# Patient Record
Sex: Male | Born: 1955 | Race: White | Hispanic: No | Marital: Single | Smoking: Former smoker
Health system: Southern US, Community
[De-identification: ages and names within clinical notes are randomized; demographics above are authoritative.]

## PROBLEM LIST (undated history)

## (undated) DIAGNOSIS — B20 Human immunodeficiency virus [HIV] disease: Secondary | ICD-10-CM

## (undated) DIAGNOSIS — Z21 Asymptomatic human immunodeficiency virus [HIV] infection status: Secondary | ICD-10-CM

## (undated) DIAGNOSIS — F419 Anxiety disorder, unspecified: Secondary | ICD-10-CM

## (undated) DIAGNOSIS — M75100 Unspecified rotator cuff tear or rupture of unspecified shoulder, not specified as traumatic: Secondary | ICD-10-CM

## (undated) DIAGNOSIS — M62838 Other muscle spasm: Secondary | ICD-10-CM

## (undated) DIAGNOSIS — R110 Nausea: Secondary | ICD-10-CM

## (undated) DIAGNOSIS — IMO0002 Reserved for concepts with insufficient information to code with codable children: Secondary | ICD-10-CM

## (undated) DIAGNOSIS — J449 Chronic obstructive pulmonary disease, unspecified: Secondary | ICD-10-CM

## (undated) DIAGNOSIS — G4700 Insomnia, unspecified: Secondary | ICD-10-CM

## (undated) HISTORY — DX: Anxiety disorder, unspecified: F41.9

## (undated) HISTORY — DX: Other muscle spasm: M62.838

## (undated) HISTORY — DX: Asymptomatic human immunodeficiency virus (hiv) infection status: Z21

## (undated) HISTORY — DX: Reserved for concepts with insufficient information to code with codable children: IMO0002

## (undated) HISTORY — DX: Nausea: R11.0

## (undated) HISTORY — PX: OTHER SURGICAL HISTORY: SHX169

## (undated) HISTORY — DX: Insomnia, unspecified: G47.00

## (undated) HISTORY — DX: Human immunodeficiency virus (HIV) disease: B20

## (undated) HISTORY — DX: Unspecified rotator cuff tear or rupture of unspecified shoulder, not specified as traumatic: M75.100

---

## 2005-09-28 ENCOUNTER — Ambulatory Visit: Payer: Self-pay | Admitting: Pain Medicine

## 2007-08-03 ENCOUNTER — Emergency Department: Payer: Self-pay | Admitting: Emergency Medicine

## 2008-02-01 ENCOUNTER — Emergency Department: Payer: Self-pay | Admitting: Emergency Medicine

## 2008-02-14 ENCOUNTER — Inpatient Hospital Stay: Payer: Self-pay | Admitting: Internal Medicine

## 2008-10-12 ENCOUNTER — Ambulatory Visit: Payer: Self-pay | Admitting: Otolaryngology

## 2009-08-26 ENCOUNTER — Ambulatory Visit: Payer: Self-pay | Admitting: Specialist

## 2009-09-14 ENCOUNTER — Emergency Department: Payer: Self-pay | Admitting: Emergency Medicine

## 2010-02-10 ENCOUNTER — Ambulatory Visit: Payer: Self-pay | Admitting: Internal Medicine

## 2010-02-10 ENCOUNTER — Encounter: Payer: Self-pay | Admitting: Infectious Diseases

## 2010-02-10 DIAGNOSIS — B20 Human immunodeficiency virus [HIV] disease: Secondary | ICD-10-CM | POA: Insufficient documentation

## 2010-02-10 LAB — CONVERTED CEMR LAB
ALT: 13 units/L (ref 0–53)
AST: 22 units/L (ref 0–37)
Alkaline Phosphatase: 42 units/L (ref 39–117)
CO2: 26 meq/L (ref 19–32)
Creatinine, Urine: 55.8 mg/dL
HIV 1 RNA Quant: 39 copies/mL
LDL Cholesterol: 118 mg/dL — ABNORMAL HIGH (ref 0–99)
Sodium: 138 meq/L (ref 135–145)
Total Bilirubin: 0.7 mg/dL (ref 0.3–1.2)
Total CHOL/HDL Ratio: 3.7
Total Protein, Urine: 1
Total Protein: 7.8 g/dL (ref 6.0–8.3)
VLDL: 33 mg/dL (ref 0–40)

## 2010-07-13 ENCOUNTER — Ambulatory Visit: Payer: Self-pay | Admitting: Infectious Diseases

## 2010-07-13 ENCOUNTER — Encounter: Payer: Self-pay | Admitting: Infectious Diseases

## 2010-07-13 LAB — CONVERTED CEMR LAB
AST: 28 units/L (ref 0–37)
Alkaline Phosphatase: 37 units/L — ABNORMAL LOW (ref 39–117)
BUN: 10 mg/dL (ref 6–23)
Creatinine, Ser: 0.8 mg/dL (ref 0.40–1.50)
Glucose, Bld: 126 mg/dL — ABNORMAL HIGH (ref 70–99)
HCV Ab: REACTIVE — AB
HDL: 58 mg/dL (ref >39)
Hep A Total Ab: NEGATIVE
Hepatitis B Surface Ag: NEGATIVE
LDL Cholesterol: 111 mg/dL — ABNORMAL HIGH (ref 0–99)
Total CHOL/HDL Ratio: 3.4
Triglycerides: 131 mg/dL (ref <150)

## 2010-10-06 ENCOUNTER — Ambulatory Visit: Payer: Self-pay | Admitting: Infectious Diseases

## 2010-10-06 LAB — CONVERTED CEMR LAB
ALT: 11 units/L (ref 0–53)
Albumin: 4.4 g/dL (ref 3.5–5.2)
Alkaline Phosphatase: 33 units/L — ABNORMAL LOW (ref 39–117)
Glucose, Bld: 104 mg/dL — ABNORMAL HIGH (ref 70–99)
LDL Cholesterol: 99 mg/dL (ref 0–99)
Potassium: 4.6 meq/L (ref 3.5–5.3)
Sodium: 140 meq/L (ref 135–145)
Total Bilirubin: 0.7 mg/dL (ref 0.3–1.2)
Total Protein: 6.7 g/dL (ref 6.0–8.3)
Triglycerides: 95 mg/dL (ref <150)
VLDL: 19 mg/dL (ref 0–40)

## 2011-01-24 NOTE — Assessment & Plan Note (Signed)
Summary: STUDY APPT/ LH   Vital Signs:  Patient profile:   55 year old male Height:      69 inches (175.26 cm) Weight:      165.75 pounds (75.34 kg) BMI:     24.57 Temp:     98.0 degrees F oral Pulse rate:   80 / minute BP sitting:   144 / 84  (left arm) Is Patient Diabetic? No Pain Assessment Patient in pain? yes     Location: lower back Intensity: 6 Onset of pain  chronic back pain Nutritional Status BMI of 19 -24 = normal  Does patient need assistance? Functional Status Self care Ambulation Normal   Patient here for week 672 ALLRT study visit. He denies any new problems. He is to see his regular doctor, Orlene Erm in September so I will send him his labs from this visit.Deirdre Evener RN  July 13, 2010 10:35 AM    Other Orders: Est. Patient Research Study [id number]) T-Comprehensive Metabolic Panel [id number]) T-Lipid Profile [phone]) T-Urine Protein [id number]) T-Urine Creatinine [phone] [id number]) T-Hepatitis B Surface Antigen [id number]) T-Hepatitis B Surface Antibody [id number]) T-Hepatitis B Core Antibody ([id number]) T-Hepatitis C Antibody ([id number]) T-Hepatitis A Antibody ([id number])  Process Orders Check Orders Results:     [facility]: ABN not required for this insurance Order queued for requisitioning for [facility]: July 13, 2010 9:38 AM  Tests Sent for requisitioning (July 13, 2010 9:38 AM):     07/13/2010: [facility] -- T-Comprehensive Metabolic Panel [[id number]] (signed)     07/13/2010: [facility] -- T-Lipid Profile [id number] (signed)     07/13/2010: [facility] -- T-Urine Protein [id number] (signed)     07/13/2010: [facility] -- T-Urine Creatinine [[id number]] (signed)     07/13/2010: [facility] -- T-Hepatitis B Surface Antigen [[id number]] (signed)     07/13/2010: [facility] -- T-Hepatitis B Surface Antibody  [[id number]] (signed)     07/13/2010: [facility] -- T-Hepatitis B Core Antibody [[id number]] (signed)     07/13/2010: [facility] -- T-Hepatitis C Antibody [[id number]] (signed)     07/13/2010: [facility] -- T-Hepatitis A Antibody [[id number]] (signed)

## 2011-01-24 NOTE — Miscellaneous (Signed)
Summary: HIV-1 RNA, CD4 (RESEARCH)  Clinical Lists Changes  Observations: Added new observation of CD4 COUNT: 483 microliters (02/10/2010 10:27) Added new observation of HIV1RNA QA: 39 copies/mL (02/10/2010 10:27)

## 2011-01-24 NOTE — Miscellaneous (Signed)
Summary: HIPAA Restrictions  HIPAA Restrictions   Imported By: Florinda Marker 07/13/2010 11:50:12  _____________________________________________________________________  External Attachment:    Type:   Image     Comment:   External Document

## 2011-01-24 NOTE — Assessment & Plan Note (Signed)
Summary: STUDY APPT/ LH    Other Orders: Est. Patient Research Study [id number]) T-Comprehensive Metabolic Panel [id number]) T-Lipid Profile [id number])

## 2011-01-24 NOTE — Assessment & Plan Note (Signed)
Summary: STUDY APPT/ LH   Vital Signs:  Patient profile:   55 year old male Weight:      162.6 pounds (73.91 kg) Temp:     96.7 degrees F oral Pulse rate:   79 / minute BP sitting:   131 / 84  (right arm) Is Patient Diabetic? No Research Study Name: ALLRT Pain Assessment Patient in pain? no      Nutritional Status BMI of 19 -24 = normal  Does patient need assistance? Functional Status Self care Ambulation Normal   Patient here for his first ALLRT visit at this site. He is transferring from [facility]. He denies any new complaints, but does have current joint, back pain issues and burning pain in his legs which is chronic. He will be seeing Dr. Servando Snare at [facility] in March, so we will fax his legs to them.Deirdre Evener RN  February 10, 2010 10:36 AM    Other Orders: T-Comprehensive Metabolic Panel [id number]) T-Lipid Profile [id number]) T-Urine Creatinine [id number]) T-Urine Protein [id number]) Process Orders Check Orders Results:     [facility]: ABN not required for this insurance Order queued for requisitioning for [facility]: February 10, 2010 9:26 AM  Tests Sent for requisitioning (February 10, 2010 9:26 AM):     02/10/2010: [facility] -- T-Comprehensive Metabolic Panel [[id number]] (signed)     02/10/2010: [facility] -- T-Lipid Profile [id number] (signed)     02/10/2010: [facility] -- T-Urine Creatinine [[id number]] (signed)     02/10/2010: [facility] -- T-Urine Protein [phone] (signed)

## 2011-02-22 ENCOUNTER — Encounter: Payer: Self-pay | Admitting: Infectious Diseases

## 2011-02-22 ENCOUNTER — Ambulatory Visit (INDEPENDENT_AMBULATORY_CARE_PROVIDER_SITE_OTHER): Payer: Self-pay

## 2011-02-22 DIAGNOSIS — B20 Human immunodeficiency virus [HIV] disease: Secondary | ICD-10-CM

## 2011-02-22 LAB — CONVERTED CEMR LAB
ALT: 15 units/L (ref 0–53)
AST: 29 units/L (ref 0–37)
Albumin: 4.6 g/dL (ref 3.5–5.2)
Basophils Absolute: 0 10*3/uL (ref 0.0–0.1)
CO2: 30 meq/L (ref 19–32)
Calcium: 9.4 mg/dL (ref 8.4–10.5)
Chloride: 102 meq/L (ref 96–112)
Cholesterol: 187 mg/dL (ref 0–200)
Eosinophils Relative: 1 % (ref 0–5)
HCT: 42.3 % (ref 39.0–52.0)
Lymphocytes Relative: 36 % (ref 12–46)
Lymphs Abs: 1.7 10*3/uL (ref 0.7–4.0)
Neutro Abs: 2.5 10*3/uL (ref 1.7–7.7)
Neutrophils Relative %: 51 % (ref 43–77)
Platelets: 259 10*3/uL (ref 150–400)
Potassium: 4.8 meq/L (ref 3.5–5.3)
RDW: 12.9 % (ref 11.5–15.5)
Sodium: 140 meq/L (ref 135–145)
Total CHOL/HDL Ratio: 3.6
Total Protein: 7.5 g/dL (ref 6.0–8.3)
Triglycerides: 189 mg/dL — ABNORMAL HIGH (ref <150)
WBC: 4.9 10*3/uL (ref 4.0–10.5)

## 2011-03-02 NOTE — Assessment & Plan Note (Signed)
Summary: STUDY APPT   Vital Signs:  Patient profile:   55 year old male Weight:      167.0 pounds (75.91 kg) Temp:     98.1 degrees F (36.72 degrees C) oral Pulse rate:   78 / minute BP sitting:   128 / 80  (right arm)  Vitals Entered By: Tacey Heap RN (February 22, 2011 11:38 AM)  02/22/2011 Research Visit: Pt here for A5001, week 704 study visit. Pt reports feeling well; VSS; Pt reports having 4 lower teeth [facility] ago [facility] [place]. Pt going for f/u on monday at [facility]. Pt denies any pain associated with this area; only pain for the first day. Pt does c/o ongoing back pain that is chronic. Non-fasting labs were drawn. Pt next research visit anticipated for June 07, 2011 @ 0930.  Tacey Heap RN  February 22, 2011 11:50 AM     Other Orders: Est. Patient Research Study [id number]) T-CBC w/Diff [id number]) T-Lipid Profile [id number]) T-Comprehensive Metabolic Panel [id number])   Orders Added: 1)  Est. Patient Research Study [04200] 2)  T-CBC w/Diff [[id number]] 3)  T-Lipid Profile [[id number]] 4)  T-Comprehensive Metabolic Panel [[id number]]

## 2011-05-02 ENCOUNTER — Encounter: Payer: Self-pay | Admitting: Emergency Medicine

## 2011-05-26 ENCOUNTER — Encounter: Payer: Self-pay | Admitting: Emergency Medicine

## 2011-06-14 ENCOUNTER — Ambulatory Visit (INDEPENDENT_AMBULATORY_CARE_PROVIDER_SITE_OTHER): Payer: Self-pay | Admitting: *Deleted

## 2011-06-14 VITALS — BP 118/80 | HR 67 | Temp 97.8°F | Wt 166.5 lb

## 2011-06-14 DIAGNOSIS — B20 Human immunodeficiency virus [HIV] disease: Secondary | ICD-10-CM

## 2011-06-14 LAB — COMPREHENSIVE METABOLIC PANEL
Albumin: 4.6 g/dL (ref 3.5–5.2)
Alkaline Phosphatase: 42 U/L (ref 39–117)
BUN: 8 mg/dL (ref 6–23)
Calcium: 9.4 mg/dL (ref 8.4–10.5)
Creat: 0.87 mg/dL (ref 0.50–1.35)
Glucose, Bld: 99 mg/dL (ref 70–99)
Potassium: 4.4 mEq/L (ref 3.5–5.3)

## 2011-06-14 LAB — LIPID PANEL
HDL: 63 mg/dL (ref >39)
Total CHOL/HDL Ratio: 3 Ratio
Triglycerides: 126 mg/dL (ref <150)

## 2011-06-14 NOTE — Progress Notes (Signed)
06/14/2011 @ 0900: Pt here for research study A5001, week 720. Pt denies any new findings. Pt has stopped taking Sustiva and Combivir in May 2012. He has started to take Atripla in May 2012. This was all discussed and decided with his doctor, Dr. Servando Snare, at [facility]. Fasting labs were drawn. Pt stated he will be getting new bottom dentures in August from [place]. Pt received $20 gift card for visit. Next scheduled appointment is for Wednesday, September 27, 2011 @ 10am. -- Tacey Heap RN

## 2011-06-29 ENCOUNTER — Encounter: Payer: Self-pay | Admitting: Infectious Diseases

## 2011-06-29 LAB — CD4/CD8 (T-HELPER/T-SUPPRESSOR CELL)
CD4%: 35.1
CD8: 608

## 2011-06-29 LAB — HIV-1 RNA QUANT-NO REFLEX-BLD: HIV-1 RNA Viral Load: <40

## 2011-09-27 ENCOUNTER — Encounter: Payer: Self-pay | Admitting: *Deleted

## 2011-09-27 ENCOUNTER — Ambulatory Visit (INDEPENDENT_AMBULATORY_CARE_PROVIDER_SITE_OTHER): Payer: Self-pay | Admitting: *Deleted

## 2011-09-27 VITALS — BP 142/85 | HR 81 | Temp 98.0°F | Wt 162.5 lb

## 2011-09-27 DIAGNOSIS — B20 Human immunodeficiency virus [HIV] disease: Secondary | ICD-10-CM

## 2011-09-27 LAB — COMPREHENSIVE METABOLIC PANEL
ALT: 8 U/L (ref 0–53)
Alkaline Phosphatase: 44 U/L (ref 39–117)
Creat: 0.86 mg/dL (ref 0.50–1.35)
Glucose, Bld: 105 mg/dL — ABNORMAL HIGH (ref 70–99)
Sodium: 139 mEq/L (ref 135–145)
Total Bilirubin: 0.5 mg/dL (ref 0.3–1.2)
Total Protein: 7 g/dL (ref 6.0–8.3)

## 2011-09-27 LAB — LIPID PANEL
LDL Cholesterol: 77 mg/dL (ref 0–99)
Total CHOL/HDL Ratio: 3.3 Ratio
Triglycerides: 145 mg/dL (ref <150)
VLDL: 29 mg/dL (ref 0–40)

## 2011-09-27 NOTE — Progress Notes (Signed)
09/27/2011 @ 1000: Pt here for research study A5001, week 736. Pt has knee pain for the past 2 weeks from working in the garden. Pt has no other complaints or new findings. Pt wanting to become an established patient here at [facility] since it is closer to where he lives. Give patient a transfer form to take to his PCP, Dr. Servando Snare, in [place]. VSS. Non fasting labs were drawn. Pt received $20.00 gift card for visit. Next scheduled appointment is for Wednesday, January 17, 2012 at 2pm. Tacey Heap RN

## 2011-10-12 ENCOUNTER — Encounter: Payer: Self-pay | Admitting: Infectious Disease

## 2011-10-12 ENCOUNTER — Ambulatory Visit (INDEPENDENT_AMBULATORY_CARE_PROVIDER_SITE_OTHER): Payer: Medicaid Other | Admitting: Infectious Disease

## 2011-10-12 VITALS — BP 150/78 | HR 78 | Temp 98.1°F | Wt 158.0 lb

## 2011-10-12 DIAGNOSIS — M62838 Other muscle spasm: Secondary | ICD-10-CM

## 2011-10-12 DIAGNOSIS — F411 Generalized anxiety disorder: Secondary | ICD-10-CM

## 2011-10-12 DIAGNOSIS — J302 Other seasonal allergic rhinitis: Secondary | ICD-10-CM

## 2011-10-12 DIAGNOSIS — Z23 Encounter for immunization: Secondary | ICD-10-CM

## 2011-10-12 DIAGNOSIS — B20 Human immunodeficiency virus [HIV] disease: Secondary | ICD-10-CM

## 2011-10-12 DIAGNOSIS — IMO0002 Reserved for concepts with insufficient information to code with codable children: Secondary | ICD-10-CM | POA: Insufficient documentation

## 2011-10-12 DIAGNOSIS — J309 Allergic rhinitis, unspecified: Secondary | ICD-10-CM

## 2011-10-12 DIAGNOSIS — Z8739 Personal history of other diseases of the musculoskeletal system and connective tissue: Secondary | ICD-10-CM | POA: Insufficient documentation

## 2011-10-12 DIAGNOSIS — M75100 Unspecified rotator cuff tear or rupture of unspecified shoulder, not specified as traumatic: Secondary | ICD-10-CM | POA: Insufficient documentation

## 2011-10-12 DIAGNOSIS — R110 Nausea: Secondary | ICD-10-CM | POA: Insufficient documentation

## 2011-10-12 DIAGNOSIS — F419 Anxiety disorder, unspecified: Secondary | ICD-10-CM | POA: Insufficient documentation

## 2011-10-12 DIAGNOSIS — G4700 Insomnia, unspecified: Secondary | ICD-10-CM | POA: Insufficient documentation

## 2011-10-12 MED ORDER — BACLOFEN 10 MG PO TABS: 10.0000 mg | ORAL_TABLET | Freq: Every day | ORAL | Status: DC

## 2011-10-12 MED ORDER — FLUTICASONE PROPIONATE (INHAL) 50 MCG/BLIST IN AEPB: 1.0000 | INHALATION_SPRAY | Freq: Two times a day (BID) | RESPIRATORY_TRACT | Status: DC

## 2011-10-12 MED ORDER — FLUTICASONE PROPIONATE 50 MCG/ACT NA SUSP: 2.0000 | Freq: Every day | NASAL | Status: DC

## 2011-10-12 MED ORDER — OXYCODONE-ACETAMINOPHEN 10-325 MG PO TABS: 1.0000 | ORAL_TABLET | ORAL | Status: DC | PRN

## 2011-10-12 NOTE — Progress Notes (Signed)
Subjective:    Patient ID: Albert Wong, male    DOB: 1956/09/03, 55 y.o.   MRN: [id number]  HPI  Mr. Nolon Bussing is a 55 year old Caucasian male with HIV that has been followed at [facility] by Dr. Orlene Erm. He was diagnosed in 56 years ago. At that time his CD4 count was about 300 viral load bilaterally thousand. He's been on antiviral therapy since then. More recently he has been on ATRIPLA with perfect neurological suppression with an undetectable viral load for many years. His most recent labs here with [facility] show his viral load to be less than 40 and his CD4 count is 45 5. He does have comorbid problems of degenerative disc disease muscle spasms for which he takes baclofen as well as Percocet. He has insomnia and is treated for this with diazepam. He also takes Marinol to help with chronic nausea. We spent in total of 60 minutes with this patient today including greater than 50% of time counseling the patient and coordinating his care obtaining his records and you to [facility].  Review of Systems  Constitutional: Negative for fever, chills, diaphoresis, activity change, appetite change, fatigue and unexpected weight change.  HENT: Negative for congestion, sore throat, rhinorrhea, sneezing, trouble swallowing and sinus pressure.   Eyes: Negative for photophobia and visual disturbance.  Respiratory: Negative for cough, chest tightness, shortness of breath, wheezing and stridor.   Cardiovascular: Negative for chest pain, palpitations and leg swelling.  Gastrointestinal: Negative for nausea, vomiting, abdominal pain, diarrhea, constipation, blood in stool, abdominal distention and anal bleeding.  Genitourinary: Negative for dysuria, hematuria, flank pain and difficulty urinating.  Musculoskeletal: Negative for myalgias, back pain, joint swelling, arthralgias and gait problem.  Skin: Negative for color change, pallor, rash and wound.  Neurological: Negative for dizziness, tremors, weakness and  light-headedness.  Hematological: Negative for adenopathy. Does not bruise/bleed easily.  Psychiatric/Behavioral: Negative for behavioral problems, confusion, sleep disturbance, dysphoric mood, decreased concentration and agitation.       Objective:   Physical Exam  Constitutional: He is oriented to person, place, and time. He appears well-developed and well-nourished. No distress.  HENT:  Head: Normocephalic and atraumatic.  Mouth/Throat: Oropharynx is clear and moist. No oropharyngeal exudate.  Eyes: Conjunctivae and EOM are normal. Pupils are equal, round, and reactive to light. No scleral icterus.  Neck: Normal range of motion. Neck supple. No JVD present.  Cardiovascular: Normal rate, regular rhythm and normal heart sounds.  Exam reveals no gallop and no friction rub.   No murmur heard. Pulmonary/Chest: Effort normal and breath sounds normal. No respiratory distress. He has no wheezes. He has no rales. He exhibits no tenderness.  Abdominal: He exhibits no distension and no mass. There is no tenderness. There is no rebound and no guarding.  Musculoskeletal: He exhibits no edema and no tenderness.  Lymphadenopathy:    He has no cervical adenopathy.  Neurological: He is alert and oriented to person, place, and time. He has normal reflexes. He exhibits normal muscle tone. Coordination normal.  Skin: Skin is warm and dry. He is not diaphoretic. No erythema. No pallor.       Tattoos bilaterally  Psychiatric: He has a normal mood and affect. His behavior is normal. Judgment and thought content normal.          Assessment & Plan:  HIV INFECTION Continue Atripla continued labs through the [facility].  Degenerative disk disease Continue Percocet for pain draw up pain contract  Muscle spasm Continue  baclofen  Anxiety Continue Valium  Insomnia Valium  Chronic nausea Continue Marinol

## 2011-10-12 NOTE — Assessment & Plan Note (Signed)
-   Continue Valium 

## 2011-10-12 NOTE — Assessment & Plan Note (Signed)
Continue Atripla continued labs through the [facility].

## 2011-10-12 NOTE — Assessment & Plan Note (Signed)
Continue Percocet for pain draw up pain contract

## 2011-10-12 NOTE — Assessment & Plan Note (Signed)
Valium

## 2011-10-12 NOTE — Assessment & Plan Note (Signed)
Continue baclofen 

## 2011-10-12 NOTE — Assessment & Plan Note (Signed)
Continue Marinol

## 2011-10-19 ENCOUNTER — Encounter: Payer: Self-pay | Admitting: Infectious Disease

## 2011-10-26 ENCOUNTER — Telehealth: Payer: Self-pay | Admitting: *Deleted

## 2011-10-26 NOTE — Telephone Encounter (Signed)
Patient called advised that he was in to see the provider on 10/12/11 and mentioned he wants to stop smoking. However he forgot to ask for a Rx for the Nicotine patches. He called today to ask if his provider would prescribe them now. Advised the patient that since we have never prescribed them for him I would have to let the doctor know what the patient wants and have him first decide if he will give it to him ant then the dosing information. He agreed and understands that this may take a minute as his provider is not in the clinic today.

## 2011-10-30 ENCOUNTER — Other Ambulatory Visit: Payer: Self-pay | Admitting: Infectious Disease

## 2011-10-30 DIAGNOSIS — F17200 Nicotine dependence, unspecified, uncomplicated: Secondary | ICD-10-CM

## 2011-10-30 MED ORDER — NICOTINE 21 MG/24HR TD PT24: 1.0000 | MEDICATED_PATCH | TRANSDERMAL | Status: DC

## 2011-10-30 MED ORDER — NICOTINE 7 MG/24HR TD PT24: 1.0000 | MEDICATED_PATCH | TRANSDERMAL | Status: DC

## 2011-10-30 MED ORDER — NICOTINE 14 MG/24HR TD PT24: 1.0000 | MEDICATED_PATCH | TRANSDERMAL | Status: DC

## 2011-10-30 NOTE — Telephone Encounter (Signed)
That is fine 

## 2011-10-31 ENCOUNTER — Other Ambulatory Visit: Payer: Self-pay | Admitting: Licensed Clinical Social Worker

## 2011-10-31 DIAGNOSIS — F17200 Nicotine dependence, unspecified, uncomplicated: Secondary | ICD-10-CM

## 2011-10-31 MED ORDER — NICOTINE 14 MG/24HR TD PT24: 1.0000 | MEDICATED_PATCH | TRANSDERMAL | Status: AC

## 2011-10-31 MED ORDER — NICOTINE 7 MG/24HR TD PT24: 1.0000 | MEDICATED_PATCH | TRANSDERMAL | Status: AC

## 2011-10-31 MED ORDER — NICOTINE 21 MG/24HR TD PT24: 1.0000 | MEDICATED_PATCH | TRANSDERMAL | Status: AC

## 2011-11-03 ENCOUNTER — Other Ambulatory Visit: Payer: Self-pay | Admitting: *Deleted

## 2011-11-03 DIAGNOSIS — H1010 Acute atopic conjunctivitis, unspecified eye: Secondary | ICD-10-CM

## 2011-11-03 MED ORDER — OLOPATADINE HCL 0.1 % OP SOLN: 1.0000 [drp] | Freq: Two times a day (BID) | OPHTHALMIC | Status: DC

## 2011-11-06 ENCOUNTER — Other Ambulatory Visit: Payer: Self-pay | Admitting: Licensed Clinical Social Worker

## 2011-11-06 DIAGNOSIS — IMO0002 Reserved for concepts with insufficient information to code with codable children: Secondary | ICD-10-CM

## 2011-11-06 MED ORDER — OXYCODONE-ACETAMINOPHEN 10-325 MG PO TABS: 1.0000 | ORAL_TABLET | ORAL | Status: DC | PRN

## 2011-11-21 ENCOUNTER — Other Ambulatory Visit: Payer: Self-pay | Admitting: Infectious Disease

## 2011-11-21 DIAGNOSIS — B20 Human immunodeficiency virus [HIV] disease: Secondary | ICD-10-CM

## 2011-11-30 ENCOUNTER — Telehealth: Payer: Self-pay | Admitting: *Deleted

## 2011-11-30 DIAGNOSIS — IMO0002 Reserved for concepts with insufficient information to code with codable children: Secondary | ICD-10-CM

## 2011-11-30 NOTE — Telephone Encounter (Signed)
I rec'd a prior auth form for pt's pantanol eye drops. I called the pharmacy as I see it is on the preferred list. They told me it has been changed to non-preferred. I called medicaid & they just changed the formulary. Pt must try & fail 2 other eye drops for a trial of 30 days each before they will approve pantanol. Sent to md for change of med if desired

## 2011-12-04 ENCOUNTER — Other Ambulatory Visit: Payer: Self-pay | Admitting: Licensed Clinical Social Worker

## 2011-12-04 DIAGNOSIS — M62838 Other muscle spasm: Secondary | ICD-10-CM

## 2011-12-04 MED ORDER — BACLOFEN 10 MG PO TABS: 10.0000 mg | ORAL_TABLET | Freq: Every day | ORAL | Status: DC

## 2011-12-04 MED ORDER — OXYCODONE-ACETAMINOPHEN 10-325 MG PO TABS: 1.0000 | ORAL_TABLET | ORAL | Status: DC | PRN

## 2011-12-04 NOTE — Telephone Encounter (Signed)
Explained to him we cannot get the pantonol eye drops due to medicaid. I will send another message to md to see if there is an alternative Pain meds due 23rd. Told him I will have them ready by Friday the 21st

## 2011-12-04 NOTE — Telephone Encounter (Signed)
[employer] lists the following as their Preferred  Topical antihistamines for allergic conjunctivitis:  olopatadine (Patanol, Pataday)  azelastine HCl (Optivar)  epinastine (Elestat)  pemirolast potassium (Alamast)  ketotifen fumarate (generic, Ketotifen))   Are any of these on the preferred list for his insurance?

## 2011-12-05 ENCOUNTER — Other Ambulatory Visit: Payer: Self-pay | Admitting: *Deleted

## 2011-12-05 DIAGNOSIS — R110 Nausea: Secondary | ICD-10-CM

## 2011-12-05 MED ORDER — DRONABINOL 5 MG PO CAPS: 5.0000 mg | ORAL_CAPSULE | Freq: Two times a day (BID) | ORAL | Status: DC

## 2011-12-05 NOTE — Telephone Encounter (Signed)
alamast is on the preferred list.  ( Pantanol is as well but even though they tried to make it go thru, medicaid still denied it )

## 2011-12-11 NOTE — Telephone Encounter (Signed)
alamast is on their preferred list

## 2011-12-12 ENCOUNTER — Other Ambulatory Visit: Payer: Self-pay | Admitting: Infectious Disease

## 2011-12-12 ENCOUNTER — Other Ambulatory Visit: Payer: Self-pay | Admitting: *Deleted

## 2011-12-12 DIAGNOSIS — H1010 Acute atopic conjunctivitis, unspecified eye: Secondary | ICD-10-CM

## 2011-12-12 MED ORDER — PEMIROLAST POTASSIUM 0.1 % OP SOLN: 2.0000 [drp] | Freq: Four times a day (QID) | OPHTHALMIC | Status: DC

## 2011-12-12 NOTE — Telephone Encounter (Signed)
I added alamast to his active meds

## 2011-12-12 NOTE — Telephone Encounter (Signed)
He wants to come pick up his pain med rx. States medicaid will not fill it until Sunday. Told him this would be fine. Also he c/o R knee pain. States this is the second time in 2 weeks that he has injured it. He declined to see another md. Told him md would likely send him to a specialist. He has medicaid which will pay for this. He refused to make an appt here now. States he wants to wait for after the holidays

## 2011-12-13 ENCOUNTER — Telehealth: Payer: Self-pay | Admitting: *Deleted

## 2011-12-13 NOTE — Telephone Encounter (Signed)
Notification from [facility] that Alamast eye drops are on the "preferred" list for Medicaid.  Phone call to Medicaid.  Due to pt not being tried on alternative "non-preferred" drops he must start with another generic drop ie., pataday, cromolyn sodium drops, or Alamast.  Will forward to Dr. Algis Liming for advise.

## 2012-01-08 ENCOUNTER — Other Ambulatory Visit: Payer: Self-pay | Admitting: *Deleted

## 2012-01-08 DIAGNOSIS — IMO0002 Reserved for concepts with insufficient information to code with codable children: Secondary | ICD-10-CM

## 2012-01-08 MED ORDER — OXYCODONE-ACETAMINOPHEN 10-325 MG PO TABS: 1.0000 | ORAL_TABLET | ORAL | Status: DC | PRN

## 2012-01-08 NOTE — Telephone Encounter (Signed)
Requesting pain rx refill to be picked up 01/17/12 when he comes for Research OV.

## 2012-01-10 ENCOUNTER — Other Ambulatory Visit: Payer: Self-pay | Admitting: Infectious Disease

## 2012-01-10 DIAGNOSIS — H1010 Acute atopic conjunctivitis, unspecified eye: Secondary | ICD-10-CM

## 2012-01-10 MED ORDER — PEMIROLAST POTASSIUM 0.1 % OP SOLN: 2.0000 [drp] | Freq: Four times a day (QID) | OPHTHALMIC | Status: DC

## 2012-01-10 NOTE — Progress Notes (Signed)
There were issues with his insurance.  There were multiple faxes sent which were confusing.  The last ones were in your box earlier this week.

## 2012-01-10 NOTE — Telephone Encounter (Signed)
Lets go with alamast

## 2012-01-16 ENCOUNTER — Encounter: Payer: Self-pay | Admitting: Infectious Disease

## 2012-01-16 ENCOUNTER — Other Ambulatory Visit: Payer: Self-pay | Admitting: Infectious Disease

## 2012-01-16 DIAGNOSIS — H1010 Acute atopic conjunctivitis, unspecified eye: Secondary | ICD-10-CM

## 2012-01-16 MED ORDER — OLOPATADINE HCL 0.2 % OP SOLN: 1.0000 [drp] | Freq: Two times a day (BID) | OPHTHALMIC | Status: DC

## 2012-01-17 ENCOUNTER — Ambulatory Visit (INDEPENDENT_AMBULATORY_CARE_PROVIDER_SITE_OTHER): Payer: Medicaid Other | Admitting: *Deleted

## 2012-01-17 ENCOUNTER — Ambulatory Visit (INDEPENDENT_AMBULATORY_CARE_PROVIDER_SITE_OTHER): Payer: Medicaid Other | Admitting: Infectious Disease

## 2012-01-17 ENCOUNTER — Encounter: Payer: Self-pay | Admitting: *Deleted

## 2012-01-17 ENCOUNTER — Encounter: Payer: Self-pay | Admitting: Infectious Disease

## 2012-01-17 VITALS — BP 158/83 | HR 80 | Temp 97.6°F | Resp 16 | Wt 158.2 lb

## 2012-01-17 DIAGNOSIS — Z79899 Other long term (current) drug therapy: Secondary | ICD-10-CM

## 2012-01-17 DIAGNOSIS — B20 Human immunodeficiency virus [HIV] disease: Secondary | ICD-10-CM

## 2012-01-17 DIAGNOSIS — W19XXXA Unspecified fall, initial encounter: Secondary | ICD-10-CM

## 2012-01-17 DIAGNOSIS — Z23 Encounter for immunization: Secondary | ICD-10-CM

## 2012-01-17 DIAGNOSIS — M545 Low back pain, unspecified: Secondary | ICD-10-CM

## 2012-01-17 DIAGNOSIS — B1920 Unspecified viral hepatitis C without hepatic coma: Secondary | ICD-10-CM | POA: Insufficient documentation

## 2012-01-17 LAB — CBC WITH DIFFERENTIAL/PLATELET
Basophils Relative: 1 % (ref 0–1)
Eosinophils Absolute: 0 10*3/uL (ref 0.0–0.7)
HCT: 47.7 % (ref 39.0–52.0)
Hemoglobin: 16.3 g/dL (ref 13.0–17.0)
MCH: 33.3 pg (ref 26.0–34.0)
MCHC: 34.2 g/dL (ref 30.0–36.0)
Monocytes Absolute: 0.7 10*3/uL (ref 0.1–1.0)
Monocytes Relative: 8 % (ref 3–12)

## 2012-01-17 LAB — COMPREHENSIVE METABOLIC PANEL
ALT: 11 U/L (ref 0–53)
AST: 27 U/L (ref 0–37)
Alkaline Phosphatase: 48 U/L (ref 39–117)
Calcium: 9.6 mg/dL (ref 8.4–10.5)
Chloride: 102 mEq/L (ref 96–112)
Creat: 0.88 mg/dL (ref 0.50–1.35)
Potassium: 4.4 mEq/L (ref 3.5–5.3)

## 2012-01-17 LAB — LIPID PANEL
LDL Cholesterol: 107 mg/dL — ABNORMAL HIGH (ref 0–99)
Triglycerides: 166 mg/dL — ABNORMAL HIGH (ref <150)
VLDL: 33 mg/dL (ref 0–40)

## 2012-01-17 MED ORDER — MORPHINE SULFATE CR 30 MG PO TB12: 30.0000 mg | ORAL_TABLET | Freq: Two times a day (BID) | ORAL | Status: DC

## 2012-01-17 MED ORDER — OXYCODONE HCL 10 MG PO TB12: 10.0000 mg | ORAL_TABLET | Freq: Three times a day (TID) | ORAL | Status: DC | PRN

## 2012-01-17 NOTE — Assessment & Plan Note (Addendum)
Never been treating. Per pt, they had been waiting for IFN free regimen. ? Clinical trial in [place], we dont have one ourselves for hep c rx yet

## 2012-01-17 NOTE — Assessment & Plan Note (Signed)
Will go to ms contin plus oxycodone

## 2012-01-17 NOTE — Assessment & Plan Note (Signed)
Perfect suppression 

## 2012-01-17 NOTE — Assessment & Plan Note (Addendum)
Apparently due to loss of strength in right leg suddenly, this has happened multiple times in the past. Will check MRI to ensure no progression of findings, will check plain films of knee

## 2012-01-17 NOTE — Progress Notes (Signed)
Subjective:    Patient ID: Albert Wong, male    DOB: 14-Oct-1956, 56 y.o.   MRN: [id number]  HPI  56 year old with HIV and Hepatitis C coinfection, has been followed at [facility] by Dr. Orlene Erm. He was diagnosed in 25 years ago. He has been perfectly suppressed on atripla. He has chronic pain in lower back and apparently has lumbar disk disease. He is worked into clinic today for acute problem of having fallen twice int he past two weeks. One occurred in the woods while he was walking when his right leg suddenly gave out from under him and he fell without injury that time. Last week walking down his driveway his right leg again gave out from under him and he fell striking his left hand and his left knee against the ground. His pain at these sites and more so his lower back is not controlled with his four 10mg  oxycodones per day.  ON exam there are no visible or palpable fractures or deformities and he has normal strength on exam.. I spent greater than 45 minutes with the patient including greater than 50% of time in face to face counsel of the patient and in coordination of their care.    Review of Systems  Constitutional: Negative for fever, chills, diaphoresis, activity change, appetite change, fatigue and unexpected weight change.  HENT: Negative for congestion, sore throat, rhinorrhea, sneezing, trouble swallowing and sinus pressure.   Eyes: Negative for photophobia and visual disturbance.  Respiratory: Negative for cough, chest tightness, shortness of breath, wheezing and stridor.   Cardiovascular: Negative for chest pain, palpitations and leg swelling.  Gastrointestinal: Negative for nausea, vomiting, abdominal pain, diarrhea, constipation, blood in stool, abdominal distention and anal bleeding.  Genitourinary: Negative for dysuria, hematuria, flank pain and difficulty urinating.  Musculoskeletal: Positive for myalgias, back pain and arthralgias. Negative for joint swelling and gait  problem.  Skin: Negative for color change, pallor, rash and wound.  Neurological: Positive for weakness. Negative for dizziness, tremors and light-headedness.  Hematological: Negative for adenopathy. Does not bruise/bleed easily.  Psychiatric/Behavioral: Negative for behavioral problems, confusion, sleep disturbance, dysphoric mood, decreased concentration and agitation.       Objective:   Physical Exam  Constitutional: He is oriented to person, place, and time. He appears well-developed and well-nourished. No distress.  HENT:  Head: Normocephalic and atraumatic.  Mouth/Throat: Oropharynx is clear and moist. No oropharyngeal exudate.  Eyes: Conjunctivae and EOM are normal. Pupils are equal, round, and reactive to light. No scleral icterus.  Neck: Normal range of motion. Neck supple. No JVD present.  Cardiovascular: Normal rate, regular rhythm and normal heart sounds.  Exam reveals no gallop and no friction rub.   No murmur heard. Pulmonary/Chest: Effort normal and breath sounds normal. No respiratory distress. He has no wheezes. He has no rales. He exhibits no tenderness.  Abdominal: He exhibits no distension and no mass. There is no tenderness. There is no rebound and no guarding.  Musculoskeletal: He exhibits no edema and no tenderness.       Right knee: He exhibits normal range of motion, no swelling, no effusion, no ecchymosis, no deformity and no laceration. tenderness found.       Left knee: He exhibits bony tenderness. He exhibits normal range of motion, no swelling, no effusion, no ecchymosis, no deformity, no laceration, no erythema, normal alignment and no LCL laxity.       Back:  Lymphadenopathy:    He has no  cervical adenopathy.  Neurological: He is alert and oriented to person, place, and time. He has normal reflexes. He exhibits normal muscle tone. Coordination normal.  Skin: Skin is warm and dry. He is not diaphoretic. No erythema. No pallor.  Psychiatric: He has a normal  mood and affect. His behavior is normal. Judgment and thought content normal.          Assessment & Plan:  HIV INFECTION Perfect suppression  Hepatitis C Never been treating. Per pt, they had been waiting for IFN free regimen. ? Clinical trial in [place], we dont have one ourselves for hep c rx yet  Fall Apparently due to loss of strength in right leg suddenly, this has happened multiple times in the past. Will check MRI to ensure no progression of findings, will check plain films of knee  Lumbago Will go to ms contin plus oxycodone

## 2012-01-17 NOTE — Progress Notes (Signed)
Patient here for ALLRT study appt., week 752. He is in considerable pain with his back. He says he has fallen several times lately when his rt leg gives out. The pain med he is on does not seem to help. He is also having trouble keeping his weight up. He saw Dr. Daiva Eves today for this problem. He will return in June for the next study appt.

## 2012-01-17 NOTE — Progress Notes (Signed)
Addended by: Starleen Arms D on: 01/17/2012 11:30 AM   Modules accepted: Orders

## 2012-01-18 ENCOUNTER — Other Ambulatory Visit: Payer: Self-pay | Admitting: Licensed Clinical Social Worker

## 2012-01-18 DIAGNOSIS — G8929 Other chronic pain: Secondary | ICD-10-CM

## 2012-01-18 DIAGNOSIS — H1010 Acute atopic conjunctivitis, unspecified eye: Secondary | ICD-10-CM

## 2012-01-18 MED ORDER — OLOPATADINE HCL 0.2 % OP SOLN: 1.0000 [drp] | Freq: Two times a day (BID) | OPHTHALMIC | Status: DC

## 2012-01-18 MED ORDER — MORPHINE SULFATE 15 MG PO TABS: 15.0000 mg | ORAL_TABLET | Freq: Three times a day (TID) | ORAL | Status: AC | PRN

## 2012-01-18 MED ORDER — MORPHINE SULFATE 15 MG PO TABS: 15.0000 mg | ORAL_TABLET | Freq: Three times a day (TID) | ORAL | Status: DC | PRN

## 2012-01-18 NOTE — Telephone Encounter (Signed)
Printed first rx with incorrect quantity. Rx needed to be 90 instead of 30 per Dr. Daiva Eves

## 2012-01-19 ENCOUNTER — Ambulatory Visit: Payer: Medicaid Other | Admitting: Rehabilitative and Restorative Service Providers"

## 2012-01-25 ENCOUNTER — Telehealth: Payer: Self-pay | Admitting: *Deleted

## 2012-01-25 NOTE — Telephone Encounter (Signed)
Can we appeal? 

## 2012-01-25 NOTE — Telephone Encounter (Signed)
Probably, ask whoever sent the request to resend. Please write something to attach that will sway them

## 2012-01-25 NOTE — Telephone Encounter (Signed)
He states he got a letter from [employer] that they would not approve the imaging studies md ordered. He wanted to know what to do next. I told him I will send this to the md & his assistant

## 2012-01-29 ENCOUNTER — Ambulatory Visit: Payer: Medicaid Other | Attending: Infectious Disease | Admitting: Rehabilitative and Restorative Service Providers"

## 2012-01-29 DIAGNOSIS — M545 Low back pain, unspecified: Secondary | ICD-10-CM | POA: Insufficient documentation

## 2012-01-29 DIAGNOSIS — M2569 Stiffness of other specified joint, not elsewhere classified: Secondary | ICD-10-CM | POA: Insufficient documentation

## 2012-01-29 DIAGNOSIS — IMO0001 Reserved for inherently not codable concepts without codable children: Secondary | ICD-10-CM | POA: Insufficient documentation

## 2012-01-31 ENCOUNTER — Other Ambulatory Visit: Payer: Self-pay | Admitting: Infectious Disease

## 2012-01-31 NOTE — Telephone Encounter (Signed)
Tamika do you have an idea about who we would need to talk to at his insurance co to get this approved? It is NOT urgent

## 2012-01-31 NOTE — Telephone Encounter (Signed)
Thanks Tamika. 

## 2012-01-31 NOTE — Telephone Encounter (Signed)
I am not sure but I will call the insurance and find out.

## 2012-02-07 ENCOUNTER — Ambulatory Visit: Payer: Medicaid Other | Admitting: Rehabilitative and Restorative Service Providers"

## 2012-02-09 ENCOUNTER — Other Ambulatory Visit: Payer: Self-pay | Admitting: *Deleted

## 2012-02-09 NOTE — Telephone Encounter (Signed)
Patient called requesting his MsContin refills for Wednesday, when he will be getting physical therapy.  Patient takes both 15 mg and 30 mg, for some reason the 15 mg was removed from his med list.  I added it back, he thought we would be closed on Monday, he will call then so the scripts can be printed for Dr. Daiva Eves to sign. Wendall Mola CMA

## 2012-02-12 ENCOUNTER — Other Ambulatory Visit: Payer: Self-pay | Admitting: *Deleted

## 2012-02-12 DIAGNOSIS — M545 Low back pain, unspecified: Secondary | ICD-10-CM

## 2012-02-12 MED ORDER — MORPHINE SULFATE CR 30 MG PO TB12: 30.0000 mg | ORAL_TABLET | Freq: Two times a day (BID) | ORAL | Status: DC

## 2012-02-12 MED ORDER — MORPHINE SULFATE 15 MG PO TABS: 15.0000 mg | ORAL_TABLET | Freq: Three times a day (TID) | ORAL | Status: DC | PRN

## 2012-02-14 ENCOUNTER — Other Ambulatory Visit: Payer: Self-pay | Admitting: *Deleted

## 2012-02-14 ENCOUNTER — Ambulatory Visit: Payer: Medicaid Other | Admitting: Rehabilitative and Restorative Service Providers"

## 2012-02-14 DIAGNOSIS — M545 Low back pain, unspecified: Secondary | ICD-10-CM

## 2012-02-14 MED ORDER — MORPHINE SULFATE 15 MG PO TABS: 15.0000 mg | ORAL_TABLET | Freq: Three times a day (TID) | ORAL | Status: DC | PRN

## 2012-02-14 NOTE — Telephone Encounter (Signed)
He cam e in to get his RXs. I reprinted the morphine 15mg . I had printed #30 & he gets #90. I confirmed this with his pharmacy. The old rx torn & witnessed by Traci Sermon, NP

## 2012-02-21 ENCOUNTER — Encounter: Payer: Medicaid Other | Admitting: Rehabilitative and Restorative Service Providers"

## 2012-02-28 ENCOUNTER — Encounter: Payer: Medicaid Other | Admitting: Rehabilitative and Restorative Service Providers"

## 2012-03-07 ENCOUNTER — Encounter: Payer: Medicaid Other | Admitting: Rehabilitative and Restorative Service Providers"

## 2012-03-11 ENCOUNTER — Other Ambulatory Visit: Payer: Self-pay | Admitting: Licensed Clinical Social Worker

## 2012-03-11 ENCOUNTER — Telehealth: Payer: Self-pay | Admitting: *Deleted

## 2012-03-11 DIAGNOSIS — B20 Human immunodeficiency virus [HIV] disease: Secondary | ICD-10-CM

## 2012-03-11 DIAGNOSIS — M545 Low back pain, unspecified: Secondary | ICD-10-CM

## 2012-03-11 MED ORDER — MORPHINE SULFATE 15 MG PO TABS: 15.0000 mg | ORAL_TABLET | Freq: Three times a day (TID) | ORAL | Status: DC | PRN

## 2012-03-11 MED ORDER — MORPHINE SULFATE CR 30 MG PO TB12: 30.0000 mg | ORAL_TABLET | Freq: Two times a day (BID) | ORAL | Status: DC

## 2012-03-11 NOTE — Telephone Encounter (Signed)
Patient called for narcotic medication refill which is due on 03/16/12.  Rx given to MD for signature.

## 2012-04-02 ENCOUNTER — Telehealth: Payer: Self-pay | Admitting: *Deleted

## 2012-04-02 NOTE — Telephone Encounter (Signed)
States he is lossing weight. Drinks a can of Boost a day & wants to know if that was ok. I told him yes. States it is $12 for 6 cans. He has an appt here soon. To talk with md. Maybe he can get Ensure thru [facility]. Advised eating regular meals with high calories. Avoid sweets. States he is under a lot of stress taking care of his mother

## 2012-04-03 ENCOUNTER — Other Ambulatory Visit: Payer: Medicaid Other

## 2012-04-03 DIAGNOSIS — B20 Human immunodeficiency virus [HIV] disease: Secondary | ICD-10-CM

## 2012-04-03 LAB — COMPREHENSIVE METABOLIC PANEL
Albumin: 4.5 g/dL (ref 3.5–5.2)
BUN: 10 mg/dL (ref 6–23)
Calcium: 9.9 mg/dL (ref 8.4–10.5)
Chloride: 102 mEq/L (ref 96–112)
Glucose, Bld: 135 mg/dL — ABNORMAL HIGH (ref 70–99)
Potassium: 4.6 mEq/L (ref 3.5–5.3)

## 2012-04-03 LAB — CBC WITH DIFFERENTIAL/PLATELET
Basophils Relative: 0 % (ref 0–1)
Eosinophils Relative: 1 % (ref 0–5)
HCT: 47.9 % (ref 39.0–52.0)
Hemoglobin: 16 g/dL (ref 13.0–17.0)
Lymphocytes Relative: 25 % (ref 12–46)
MCHC: 33.4 g/dL (ref 30.0–36.0)
MCV: 99.2 fL (ref 78.0–100.0)
Monocytes Absolute: 0.4 10*3/uL (ref 0.1–1.0)
Monocytes Relative: 8 % (ref 3–12)
Neutro Abs: 3.4 10*3/uL (ref 1.7–7.7)

## 2012-04-04 ENCOUNTER — Other Ambulatory Visit: Payer: Self-pay | Admitting: Licensed Clinical Social Worker

## 2012-04-04 DIAGNOSIS — G4700 Insomnia, unspecified: Secondary | ICD-10-CM

## 2012-04-04 LAB — T-HELPER CELL (CD4) - (RCID CLINIC ONLY): CD4 T Cell Abs: 450 uL (ref 400–2700)

## 2012-04-04 MED ORDER — DIAZEPAM 10 MG PO TABS: 10.0000 mg | ORAL_TABLET | Freq: Four times a day (QID) | ORAL | Status: DC | PRN

## 2012-04-05 LAB — HIV-1 RNA QUANT-NO REFLEX-BLD: HIV-1 RNA Quant, Log: <1.3 {Log} (ref <1.30)

## 2012-04-17 ENCOUNTER — Other Ambulatory Visit: Payer: Self-pay | Admitting: Licensed Clinical Social Worker

## 2012-04-17 ENCOUNTER — Encounter: Payer: Self-pay | Admitting: Infectious Disease

## 2012-04-17 ENCOUNTER — Telehealth: Payer: Self-pay | Admitting: Licensed Clinical Social Worker

## 2012-04-17 ENCOUNTER — Ambulatory Visit (INDEPENDENT_AMBULATORY_CARE_PROVIDER_SITE_OTHER): Payer: Medicaid Other | Admitting: Infectious Disease

## 2012-04-17 VITALS — BP 155/80 | HR 87 | Temp 97.8°F | Ht 70.0 in | Wt 145.5 lb

## 2012-04-17 DIAGNOSIS — F419 Anxiety disorder, unspecified: Secondary | ICD-10-CM

## 2012-04-17 DIAGNOSIS — B20 Human immunodeficiency virus [HIV] disease: Secondary | ICD-10-CM

## 2012-04-17 DIAGNOSIS — R110 Nausea: Secondary | ICD-10-CM

## 2012-04-17 DIAGNOSIS — M545 Low back pain, unspecified: Secondary | ICD-10-CM

## 2012-04-17 DIAGNOSIS — F411 Generalized anxiety disorder: Secondary | ICD-10-CM

## 2012-04-17 DIAGNOSIS — B1920 Unspecified viral hepatitis C without hepatic coma: Secondary | ICD-10-CM

## 2012-04-17 DIAGNOSIS — I10 Essential (primary) hypertension: Secondary | ICD-10-CM | POA: Insufficient documentation

## 2012-04-17 DIAGNOSIS — R630 Anorexia: Secondary | ICD-10-CM

## 2012-04-17 MED ORDER — MORPHINE SULFATE 15 MG PO TABS: 15.0000 mg | ORAL_TABLET | Freq: Three times a day (TID) | ORAL | Status: DC | PRN

## 2012-04-17 MED ORDER — MORPHINE SULFATE CR 30 MG PO TB12
30.0000 mg | ORAL_TABLET | Freq: Two times a day (BID) | ORAL | Status: AC
Start: 2012-04-17 — End: 2012-05-17

## 2012-04-17 MED ORDER — AMLODIPINE BESYLATE 5 MG PO TABS: 5.0000 mg | ORAL_TABLET | Freq: Every day | ORAL | Status: DC

## 2012-04-17 MED ORDER — DRONABINOL 10 MG PO CAPS: 10.0000 mg | ORAL_CAPSULE | Freq: Two times a day (BID) | ORAL | Status: AC

## 2012-04-17 MED ORDER — DRONABINOL 10 MG PO CAPS: 10.0000 mg | ORAL_CAPSULE | Freq: Two times a day (BID) | ORAL | Status: DC

## 2012-04-17 NOTE — Assessment & Plan Note (Signed)
Check genotype and viral load next visit

## 2012-04-17 NOTE — Assessment & Plan Note (Signed)
Continue atripla 

## 2012-04-17 NOTE — Progress Notes (Signed)
  Subjective:    Patient ID: Albert Wong, male    DOB: 1956/05/04, 56 y.o.   MRN: [id number]  HPI   56 year old with HIV and Hepatitis C coinfection,  Had  been followed at [facility] by Dr. Orlene Erm transitioned to [facility]. HE is on chronic narcotics for lower back pain as well as anxiolytics. He has been losing weight in part due to poor appetite. He has no other complaints today.  Review of Systems  Constitutional: Negative for fever, chills, diaphoresis, activity change, appetite change, fatigue and unexpected weight change.  HENT: Negative for congestion, sore throat, rhinorrhea, sneezing, trouble swallowing and sinus pressure.   Eyes: Negative for photophobia and visual disturbance.  Respiratory: Negative for cough, chest tightness, shortness of breath, wheezing and stridor.   Cardiovascular: Negative for chest pain, palpitations and leg swelling.  Gastrointestinal: Negative for nausea, vomiting, abdominal pain, diarrhea, constipation, blood in stool, abdominal distention and anal bleeding.  Genitourinary: Negative for dysuria, hematuria, flank pain and difficulty urinating.  Musculoskeletal: Positive for back pain. Negative for myalgias, joint swelling, arthralgias and gait problem.  Skin: Negative for color change, pallor, rash and wound.  Neurological: Negative for dizziness, tremors, weakness and light-headedness.  Hematological: Negative for adenopathy. Does not bruise/bleed easily.  Psychiatric/Behavioral: Negative for behavioral problems, confusion, sleep disturbance, dysphoric mood, decreased concentration and agitation.       Objective:   Physical Exam  Constitutional: He is oriented to person, place, and time. No distress.  HENT:  Head: Normocephalic and atraumatic.  Mouth/Throat: Oropharynx is clear and moist. No oropharyngeal exudate.  Eyes: Conjunctivae and EOM are normal. Pupils are equal, round, and reactive to light. No scleral icterus.  Neck: Normal range of  motion. Neck supple. No JVD present.  Cardiovascular: Normal rate, regular rhythm and normal heart sounds.  Exam reveals no gallop and no friction rub.   No murmur heard. Pulmonary/Chest: Effort normal and breath sounds normal. No respiratory distress. He has no wheezes. He has no rales. He exhibits no tenderness.  Abdominal: He exhibits no distension and no mass. There is no tenderness. There is no rebound and no guarding.  Musculoskeletal: He exhibits no edema and no tenderness.  Lymphadenopathy:    He has no cervical adenopathy.  Neurological: He is alert and oriented to person, place, and time. He has normal reflexes. He exhibits normal muscle tone. Coordination normal.  Skin: Skin is warm and dry. He is not diaphoretic. No erythema. No pallor.  Psychiatric: He has a normal mood and affect. His behavior is normal. Judgment and thought content normal.          Assessment & Plan:

## 2012-04-17 NOTE — Assessment & Plan Note (Signed)
Increase marinol dose

## 2012-04-17 NOTE — Assessment & Plan Note (Signed)
On benzodiazepenes stalble

## 2012-04-17 NOTE — Telephone Encounter (Signed)
Patient left his Marinol prescription today at his visit, I called the patient to ask where he would like me to call the script to. I asked him to call back and leave a message or tell the nurses in triage.

## 2012-04-17 NOTE — Assessment & Plan Note (Signed)
Start amlodipine

## 2012-04-17 NOTE — Assessment & Plan Note (Signed)
Refilled narcotics, pain contract signed

## 2012-04-24 ENCOUNTER — Telehealth: Payer: Self-pay | Admitting: *Deleted

## 2012-04-25 ENCOUNTER — Telehealth: Payer: Self-pay | Admitting: Licensed Clinical Social Worker

## 2012-04-25 NOTE — Telephone Encounter (Signed)
Medicaid paperwork placed in Dr. Zenaida Niece Dam's box for his completion.  Pharmacy request given to T. Marcell Barlow, CMA.

## 2012-04-25 NOTE — Telephone Encounter (Signed)
[facility] for [facility] for Marinol 10 mg. I called Medicaid and he is approved for this drug from 04/25/2012 to 04/25/2013, I will notify [facility] and the patient.

## 2012-04-25 NOTE — Telephone Encounter (Signed)
Prior Authorization completed and medication approved for 1 year. Patient aware and [facility] is aware that Marinol can now be filled.

## 2012-04-26 ENCOUNTER — Other Ambulatory Visit: Payer: Self-pay | Admitting: *Deleted

## 2012-04-26 NOTE — Telephone Encounter (Signed)
Medicaid has approved the med until 04/25/13

## 2012-05-13 ENCOUNTER — Other Ambulatory Visit: Payer: Self-pay | Admitting: *Deleted

## 2012-05-13 DIAGNOSIS — M545 Low back pain, unspecified: Secondary | ICD-10-CM

## 2012-05-13 MED ORDER — MORPHINE SULFATE 15 MG PO TABS: 15.0000 mg | ORAL_TABLET | Freq: Three times a day (TID) | ORAL | Status: AC | PRN

## 2012-05-13 MED ORDER — MORPHINE SULFATE ER 30 MG PO TBCR: 30.0000 mg | EXTENDED_RELEASE_TABLET | Freq: Two times a day (BID) | ORAL | Status: DC

## 2012-05-13 NOTE — Telephone Encounter (Signed)
Requesting refills on both pain rxes.  Stated that he would pick up on Wednesday, May 22.  Last refill dated 04/17/12.

## 2012-05-15 ENCOUNTER — Other Ambulatory Visit: Payer: Self-pay | Admitting: *Deleted

## 2012-05-15 DIAGNOSIS — B20 Human immunodeficiency virus [HIV] disease: Secondary | ICD-10-CM

## 2012-05-15 MED ORDER — ENSURE PO LIQD: 237.0000 mL | Freq: Two times a day (BID) | ORAL | Status: DC

## 2012-05-15 NOTE — Telephone Encounter (Signed)
Form signed by Dr. Daiva Eves.  Rx for Ensure and form faxed back to [facility] in [place].

## 2012-05-27 ENCOUNTER — Ambulatory Visit (INDEPENDENT_AMBULATORY_CARE_PROVIDER_SITE_OTHER): Payer: Medicaid Other | Admitting: *Deleted

## 2012-05-27 VITALS — BP 149/82 | HR 75 | Temp 97.4°F | Ht 69.75 in | Wt 143.8 lb

## 2012-05-27 DIAGNOSIS — B20 Human immunodeficiency virus [HIV] disease: Secondary | ICD-10-CM

## 2012-05-27 DIAGNOSIS — E785 Hyperlipidemia, unspecified: Secondary | ICD-10-CM

## 2012-05-27 DIAGNOSIS — B182 Chronic viral hepatitis C: Secondary | ICD-10-CM

## 2012-05-27 LAB — COMPREHENSIVE METABOLIC PANEL
ALT: 10 U/L (ref 0–53)
Alkaline Phosphatase: 53 U/L (ref 39–117)
Creat: 0.83 mg/dL (ref 0.50–1.35)
Glucose, Bld: 98 mg/dL (ref 70–99)
Sodium: 138 mEq/L (ref 135–145)
Total Bilirubin: 0.6 mg/dL (ref 0.3–1.2)
Total Protein: 7.3 g/dL (ref 6.0–8.3)

## 2012-05-27 LAB — LIPID PANEL
Cholesterol: 178 mg/dL (ref 0–200)
LDL Cholesterol: 99 mg/dL (ref 0–99)
Total CHOL/HDL Ratio: 3.1 Ratio
Triglycerides: 111 mg/dL (ref <150)
VLDL: 22 mg/dL (ref 0–40)

## 2012-05-27 NOTE — Progress Notes (Signed)
Patient here for week 768 ALLRT study visit. He will have one more visit on this study before it closes and he is eligible for enrollment on the HAILO study in the Fall. He has been losing weight unintentionally since January and is down over 10% from baseline.  He is now drinking ensure everyday and trying to eat as much as possible. He stays busy watching after his mother and admits to having a lot of nervous energy. He says the ms contin is helping control his pain better than the oxycodone. He has a scattered rash on his mid right back that comes and goes. He says it started after he had a tick bite there last year. There are a few red spots in that area which do not look like shingles. I directed him to have Dr. Daiva Eves look at them. If his weight continues to fall, he said he is open to other options such as serostim.

## 2012-05-28 LAB — HEPATITIS C RNA QUANTITATIVE
HCV Quantitative Log: 5.75 {Log} — ABNORMAL HIGH (ref <1.63)
HCV Quantitative: 559605 IU/mL — ABNORMAL HIGH (ref <43)

## 2012-05-28 LAB — HEPATITIS A ANTIBODY, TOTAL: Hep A Total Ab: POSITIVE — AB

## 2012-06-05 ENCOUNTER — Telehealth: Payer: Self-pay | Admitting: Licensed Clinical Social Worker

## 2012-06-05 NOTE — Telephone Encounter (Signed)
Albert Wong from the medical supply store called wanting a new prescription for a TENS UNIT, they have to have a new one every year.  It needs to be faxed to [phone] attn: Albert Wong

## 2012-06-05 NOTE — Telephone Encounter (Signed)
Yes I just need it written out on a prescription pad or order form.

## 2012-06-05 NOTE — Telephone Encounter (Signed)
I am not sure how to order the TENs unit? Can we fax them a written order?

## 2012-06-09 ENCOUNTER — Other Ambulatory Visit: Payer: Self-pay | Admitting: Infectious Disease

## 2012-06-10 ENCOUNTER — Other Ambulatory Visit: Payer: Self-pay | Admitting: *Deleted

## 2012-06-10 DIAGNOSIS — M545 Low back pain, unspecified: Secondary | ICD-10-CM

## 2012-06-10 MED ORDER — MORPHINE SULFATE ER 30 MG PO TBCR: 30.0000 mg | EXTENDED_RELEASE_TABLET | Freq: Two times a day (BID) | ORAL | Status: DC

## 2012-06-10 MED ORDER — MORPHINE SULFATE 15 MG PO TABS: 15.0000 mg | ORAL_TABLET | Freq: Three times a day (TID) | ORAL | Status: DC | PRN

## 2012-06-12 ENCOUNTER — Telehealth: Payer: Self-pay | Admitting: *Deleted

## 2012-06-12 NOTE — Telephone Encounter (Signed)
Patient called and said he is losing more weight and wanted to know if he could get started on something like serostim. He is using ensure and marinol and trying to eat as much as possible. I told him we would need to check first and see if he would qualify through medicare if Dr. Daiva Eves agrees with it. He has lost over 10% of baseline in the past year. He has a followup appt in August with Dr. Daiva Eves, but he was unsure of waiting that long to see him.

## 2012-06-14 NOTE — Telephone Encounter (Signed)
Kim I need to think about this one. Can someone look into cost of this med for him as first step?

## 2012-06-17 ENCOUNTER — Other Ambulatory Visit: Payer: Self-pay | Admitting: Infectious Disease

## 2012-06-20 ENCOUNTER — Encounter: Payer: Self-pay | Admitting: Infectious Disease

## 2012-06-20 LAB — CD4/CD8 (T-HELPER/T-SUPPRESSOR CELL)
CD4: 473
CD8 % Suppressor T Cell: 40.7

## 2012-06-20 LAB — HIV-1 RNA QUANT-NO REFLEX-BLD: HIV-1 RNA Viral Load: <40

## 2012-06-21 ENCOUNTER — Ambulatory Visit: Payer: Medicaid Other | Admitting: *Deleted

## 2012-06-21 VITALS — Ht 69.0 in | Wt 141.2 lb

## 2012-06-21 DIAGNOSIS — B20 Human immunodeficiency virus [HIV] disease: Secondary | ICD-10-CM

## 2012-06-21 LAB — CBC WITH DIFFERENTIAL/PLATELET
Basophils Absolute: 0 10*3/uL (ref 0.0–0.1)
Basophils Relative: 1 % (ref 0–1)
HCT: 46 % (ref 39.0–52.0)
Hemoglobin: 16.9 g/dL (ref 13.0–17.0)
Lymphocytes Relative: 25 % (ref 12–46)
MCHC: 36.7 g/dL — ABNORMAL HIGH (ref 30.0–36.0)
Monocytes Absolute: 0.7 10*3/uL (ref 0.1–1.0)
Monocytes Relative: 11 % (ref 3–12)
Neutro Abs: 3.6 10*3/uL (ref 1.7–7.7)
Neutrophils Relative %: 62 % (ref 43–77)
RDW: 13.6 % (ref 11.5–15.5)
WBC: 5.8 10*3/uL (ref 4.0–10.5)

## 2012-06-21 NOTE — Progress Notes (Signed)
Albert Wong is here today to participate in the [facility] PK study.

## 2012-06-24 ENCOUNTER — Telehealth: Payer: Self-pay | Admitting: Infectious Disease

## 2012-06-24 MED ORDER — SOMATROPIN (NON-REFRIGERATED) 6 MG ~~LOC~~ SOLR: 6.0000 mg | Freq: Every day | SUBCUTANEOUS | Status: DC

## 2012-06-24 NOTE — Telephone Encounter (Signed)
Kim I put in script for serostim into epic

## 2012-06-28 ENCOUNTER — Encounter: Payer: Self-pay | Admitting: Infectious Disease

## 2012-07-05 ENCOUNTER — Ambulatory Visit: Payer: Medicaid Other | Admitting: *Deleted

## 2012-07-05 DIAGNOSIS — B20 Human immunodeficiency virus [HIV] disease: Secondary | ICD-10-CM

## 2012-07-05 NOTE — Progress Notes (Signed)
Albert Wong was here for [facility] PK study and had 3 lab draws.

## 2012-07-15 ENCOUNTER — Other Ambulatory Visit: Payer: Self-pay | Admitting: Licensed Clinical Social Worker

## 2012-07-15 DIAGNOSIS — M545 Low back pain, unspecified: Secondary | ICD-10-CM

## 2012-07-15 MED ORDER — MORPHINE SULFATE ER 30 MG PO TBCR: 30.0000 mg | EXTENDED_RELEASE_TABLET | Freq: Two times a day (BID) | ORAL | Status: DC

## 2012-07-15 MED ORDER — MORPHINE SULFATE 15 MG PO TABS: 15.0000 mg | ORAL_TABLET | Freq: Three times a day (TID) | ORAL | Status: DC | PRN

## 2012-07-18 ENCOUNTER — Other Ambulatory Visit: Payer: Self-pay | Admitting: *Deleted

## 2012-07-18 DIAGNOSIS — B20 Human immunodeficiency virus [HIV] disease: Secondary | ICD-10-CM

## 2012-07-18 MED ORDER — SOMATROPIN (NON-REFRIGERATED) 6 MG ~~LOC~~ SOLR: 6.0000 mg | Freq: Every day | SUBCUTANEOUS | Status: DC

## 2012-08-01 ENCOUNTER — Other Ambulatory Visit (HOSPITAL_COMMUNITY)
Admission: RE | Admit: 2012-08-01 | Discharge: 2012-08-01 | Disposition: A | Discharge status: Home / Self Care | Payer: Medicaid Other | Source: Ambulatory Visit | Attending: Infectious Disease | Admitting: Infectious Disease

## 2012-08-01 ENCOUNTER — Encounter: Payer: Self-pay | Admitting: *Deleted

## 2012-08-01 ENCOUNTER — Other Ambulatory Visit: Payer: Self-pay | Admitting: Licensed Clinical Social Worker

## 2012-08-01 ENCOUNTER — Ambulatory Visit (INDEPENDENT_AMBULATORY_CARE_PROVIDER_SITE_OTHER): Payer: Medicaid Other | Admitting: *Deleted

## 2012-08-01 VITALS — BP 110/77 | HR 66 | Temp 98.1°F | Wt 141.0 lb

## 2012-08-01 DIAGNOSIS — Z113 Encounter for screening for infections with a predominantly sexual mode of transmission: Secondary | ICD-10-CM | POA: Insufficient documentation

## 2012-08-01 DIAGNOSIS — B20 Human immunodeficiency virus [HIV] disease: Secondary | ICD-10-CM

## 2012-08-01 LAB — LIPID PANEL
LDL Cholesterol: 91 mg/dL (ref 0–99)
Total CHOL/HDL Ratio: 2.8 Ratio
VLDL: 17 mg/dL (ref 0–40)

## 2012-08-01 LAB — COMPREHENSIVE METABOLIC PANEL
ALT: 11 U/L (ref 0–53)
AST: 27 U/L (ref 0–37)
Alkaline Phosphatase: 56 U/L (ref 39–117)
Creat: 0.81 mg/dL (ref 0.50–1.35)
Sodium: 136 mEq/L (ref 135–145)
Total Bilirubin: 0.7 mg/dL (ref 0.3–1.2)
Total Protein: 7.5 g/dL (ref 6.0–8.3)

## 2012-08-01 NOTE — Addendum Note (Signed)
Addended by: Mariea Clonts D on: 08/01/2012 03:56 PM   Modules accepted: Orders

## 2012-08-01 NOTE — Progress Notes (Signed)
Patient ID: Albert Wong, male   DOB: 25-Dec-1956, 56 y.o.   MRN: [id number]  Pt has not received new medication, serostem, from [facility] [phone]). I called them to see what was going on. I spoke with Dewayne Hatch who stated that anyone over the age of 75 that has been prescribed a growth hormone - their case has to be reviewed by healthcare provider. Paperwork was sent to them on 07/09/12. Ann checked 07/29/2012 to see the status and review was still pending. I asked her if there was anything we could do or send that would help speed this process along. She stated she had all the paperwork required and is going to follow up today to see what the status is. She said they would contact us when the results were in. The number they have for our contact is [phone]. Will relay the info above to patient. Tacey Heap RN

## 2012-08-01 NOTE — Progress Notes (Signed)
Pt here for A5001 final study visit, week 784. Pt has lost about 2 lbs since the last study visit, otherwise assessment has not changed. Fasting labs were drawn and vital signs are stable. BP is excellent now that he has started Norvasc. Pt is eligible to enroll into A5322. Will contact patient when study opens to see if interested. Pt received $20.00 giftcard for visit. Tacey Heap RN

## 2012-08-02 LAB — PROTEIN, URINE, RANDOM: Total Protein, Urine: <3 mg/dL

## 2012-08-02 LAB — CREATININE, URINE, RANDOM: Creatinine, Urine: 44.5 mg/dL

## 2012-08-07 ENCOUNTER — Other Ambulatory Visit: Payer: Medicaid Other

## 2012-08-08 ENCOUNTER — Other Ambulatory Visit: Payer: Self-pay | Admitting: Licensed Clinical Social Worker

## 2012-08-08 DIAGNOSIS — G4700 Insomnia, unspecified: Secondary | ICD-10-CM

## 2012-08-08 MED ORDER — DIAZEPAM 10 MG PO TABS: 10.0000 mg | ORAL_TABLET | Freq: Four times a day (QID) | ORAL | Status: DC | PRN

## 2012-08-12 ENCOUNTER — Other Ambulatory Visit: Payer: Self-pay | Admitting: *Deleted

## 2012-08-12 DIAGNOSIS — M545 Low back pain, unspecified: Secondary | ICD-10-CM

## 2012-08-12 MED ORDER — MORPHINE SULFATE ER 30 MG PO TBCR: 30.0000 mg | EXTENDED_RELEASE_TABLET | Freq: Two times a day (BID) | ORAL | Status: DC

## 2012-08-12 MED ORDER — MORPHINE SULFATE 15 MG PO TABS: 15.0000 mg | ORAL_TABLET | Freq: Three times a day (TID) | ORAL | Status: DC | PRN

## 2012-08-12 NOTE — Telephone Encounter (Signed)
Printed and placed in Dr. Zenaida Niece Dam's box for signature.  Pt requested to pick up on Wednesday, August 14, 2012.   Call patient when ready.

## 2012-08-14 ENCOUNTER — Other Ambulatory Visit: Payer: Self-pay | Admitting: Infectious Disease

## 2012-08-14 DIAGNOSIS — J309 Allergic rhinitis, unspecified: Secondary | ICD-10-CM | POA: Insufficient documentation

## 2012-08-15 LAB — HIV-1 RNA QUANT-NO REFLEX-BLD: HIV 1 RNA Quant: <40

## 2012-08-15 NOTE — Addendum Note (Signed)
Addended by: Phill Myron on: 08/15/2012 11:37 AM   Modules accepted: Orders

## 2012-08-16 ENCOUNTER — Encounter: Payer: Self-pay | Admitting: *Deleted

## 2012-08-16 NOTE — Progress Notes (Signed)
Patient ID: Albert Wong, male   DOB: 07-08-1956, 56 y.o.   MRN: [id number]   Pt has not received new medication, serostem, from [facility] [phone]).  I called them again to see what was going on. They had tried to fax a prior auth on 3 different occassions and closed the case on 8/20 because they received no response. I looked in computer and nothing was in there. To make a long story short they had the wrong fax #. Their records had [phone]....so I gave them correct fax # [phone]. She reopened the case and will be sending over prior auth. Will relay the info above to patient. Tacey Heap RN

## 2012-08-19 ENCOUNTER — Other Ambulatory Visit: Payer: Self-pay | Admitting: *Deleted

## 2012-08-19 DIAGNOSIS — R630 Anorexia: Secondary | ICD-10-CM

## 2012-08-19 MED ORDER — DRONABINOL 10 MG PO CAPS: 10.0000 mg | ORAL_CAPSULE | Freq: Two times a day (BID) | ORAL | Status: DC

## 2012-08-21 ENCOUNTER — Encounter: Payer: Self-pay | Admitting: Infectious Disease

## 2012-08-21 ENCOUNTER — Ambulatory Visit (INDEPENDENT_AMBULATORY_CARE_PROVIDER_SITE_OTHER): Payer: Medicaid Other | Admitting: Infectious Disease

## 2012-08-21 VITALS — BP 124/83 | HR 93 | Temp 98.0°F | Ht 69.0 in | Wt 138.8 lb

## 2012-08-21 DIAGNOSIS — B20 Human immunodeficiency virus [HIV] disease: Secondary | ICD-10-CM

## 2012-08-21 DIAGNOSIS — R634 Abnormal weight loss: Secondary | ICD-10-CM

## 2012-08-21 DIAGNOSIS — B1920 Unspecified viral hepatitis C without hepatic coma: Secondary | ICD-10-CM

## 2012-08-21 LAB — TSH: TSH: 1.714 u[IU]/mL (ref 0.350–4.500)

## 2012-08-21 LAB — TESTOSTERONE: Testosterone: 222.58 ng/dL — ABNORMAL LOW (ref 300–890)

## 2012-08-21 LAB — T3: T3, Total: 132 ng/dL (ref 80.0–204.0)

## 2012-08-21 LAB — T4, FREE: Free T4: 1.11 ng/dL (ref 0.80–1.80)

## 2012-08-21 NOTE — Assessment & Plan Note (Signed)
Will consider referral to [facility]

## 2012-08-21 NOTE — Assessment & Plan Note (Signed)
Check tfts, testosterone, serostim  has been ordered

## 2012-08-21 NOTE — Assessment & Plan Note (Signed)
Perfect control 

## 2012-08-21 NOTE — Progress Notes (Signed)
  Subjective:    Patient ID: Albert Wong, male    DOB: [birth year], 56 y.o.   MRN: [id number]  HPI  Albert Wong is a 56 y.o. male who is doing superbly well on his  antiviral regimen, atripla with undetectable viral load and health cd4 count. He continues to have trouble trying to gain weight and suffering from fatigue. His serostim paperwork has been filled out. He is feeling profoundly fatigued a great deal of the time. He has hard stools but wont take softeners and prefers enemas. Hep C still not being treated.    Review of Systems  Constitutional: Positive for fatigue and unexpected weight change. Negative for fever, chills, diaphoresis, activity change and appetite change.  HENT: Negative for congestion, sore throat, rhinorrhea, sneezing, trouble swallowing and sinus pressure.   Eyes: Negative for photophobia and visual disturbance.  Respiratory: Negative for cough, chest tightness, shortness of breath, wheezing and stridor.   Cardiovascular: Negative for chest pain, palpitations and leg swelling.  Gastrointestinal: Negative for nausea, vomiting, abdominal pain, diarrhea, constipation, blood in stool, abdominal distention and anal bleeding.  Genitourinary: Negative for dysuria, hematuria, flank pain and difficulty urinating.  Musculoskeletal: Negative for myalgias, back pain, joint swelling, arthralgias and gait problem.  Skin: Negative for color change, pallor, rash and wound.  Neurological: Negative for dizziness, tremors, weakness and light-headedness.  Hematological: Negative for adenopathy. Does not bruise/bleed easily.  Psychiatric/Behavioral: Negative for behavioral problems, confusion, disturbed wake/sleep cycle, dysphoric mood, decreased concentration and agitation.       Objective:   Physical Exam  Constitutional: He is oriented to person, place, and time. He appears well-developed and well-nourished. No distress.  HENT:  Head: Normocephalic and atraumatic.  Mouth/Throat:  Oropharynx is clear and moist. No oropharyngeal exudate.  Eyes: Conjunctivae and EOM are normal. Pupils are equal, round, and reactive to light. No scleral icterus.  Neck: Normal range of motion. Neck supple. No JVD present.  Cardiovascular: Normal rate, regular rhythm and normal heart sounds.  Exam reveals no gallop and no friction rub.   No murmur heard. Pulmonary/Chest: Effort normal and breath sounds normal. No respiratory distress. He has no wheezes. He has no rales. He exhibits no tenderness.  Abdominal: He exhibits no distension and no mass. There is no tenderness. There is no rebound and no guarding.  Musculoskeletal: He exhibits no edema and no tenderness.  Lymphadenopathy:    He has no cervical adenopathy.  Neurological: He is alert and oriented to person, place, and time. He has normal reflexes. He exhibits normal muscle tone. Coordination normal.  Skin: Skin is warm and dry. He is not diaphoretic. No erythema. No pallor.  Psychiatric: He has a normal mood and affect. His behavior is normal. Judgment and thought content normal.          Assessment & Plan:  HIV INFECTION Perfect control  Hepatitis C Will consider referral to [facility]  Weight loss Check tfts, testosterone, serostim  has been ordered

## 2012-08-21 NOTE — Progress Notes (Signed)
Patient ID: Albert Wong, male   DOB: 1956-09-09, 56 y.o.   MRN: [id number]  Called [employer] on 08/20/12 to find out status of Serostim. They had closed his account because they had no response from [place]. I asked them what number they had and had our area code as "339". I went over phone numbers they had to ensure they were correct, they reopened his case, and refaxed the information request needed for approval of Serostim.   I have faxed the information to them this morning, 08/21/12, at [phone]). Will call and follow up to see if they received the info requested. Tacey Heap RN

## 2012-08-23 ENCOUNTER — Encounter: Payer: Self-pay | Admitting: *Deleted

## 2012-08-23 NOTE — Progress Notes (Signed)
Patient ID: Albert Wong, male   DOB: [birth year], 56 y.o.   MRN: [id number]  Spoke with [facility] on 08/22/12, after sending the Rx for Serostim in ([phone]), and they stated they had received it and would be reviewing his benefits/insurance. I spoke with [place]. Pt called me this morning 8/30 and stated he received a call telling him they had not received the prescription. I proceeded to call them to find out if they had received prescription and they had NOT. Made sure I was sending the Rx to the correct fax number and I DID. So I refaxed it and called them 2 hours later to find they had not received it!!  Since we ([facility]) have been trying to start this patient on Serostim for 2 months now, I voiced my concerns about the communications of their facility. The person whom I was talking to sent me to the Pharmacy directly. I received their direct number [phone]) and faxed the prescription to their direct fax number so it would not get miss-placed for a 3rd or 4th time. Their fax ([phone]). Tacey Heap RN

## 2012-08-23 NOTE — Progress Notes (Signed)
Patient ID: Albert Wong, male   DOB: November 19, 1956, 56 y.o.   MRN: [id number]  Spoke with Mauro Kaufmann at [facility] in [place] [phone]). She stated she did receive the prescription for Serostim. She has 5mg  in stock and will need to order the 6mg  which will take a few days. I told her the patient has been waiting for 2 months so a few more days would not be a problem. She stated she would get this filled, call the patient to verify his address, and ship it directly to him. I called patient and discussed the above with him.  Tacey Heap RN

## 2012-08-27 ENCOUNTER — Telehealth: Payer: Self-pay | Admitting: Infectious Disease

## 2012-08-27 MED ORDER — TESTOSTERONE CYPIONATE 200 MG/ML IM SOLN: 50.0000 mg | INTRAMUSCULAR | Status: DC

## 2012-08-27 NOTE — Telephone Encounter (Signed)
Testosterone injection script written. If he wishes for alternative delivery patch or gel we can investigate coverage typically injection is cheaper and covered

## 2012-08-28 ENCOUNTER — Telehealth: Payer: Self-pay | Admitting: *Deleted

## 2012-08-28 DIAGNOSIS — R634 Abnormal weight loss: Secondary | ICD-10-CM

## 2012-08-28 MED ORDER — "SYRINGE/NEEDLE (DISP) 21G X 1"" 3 ML MISC": 1.0000 | Status: DC

## 2012-08-28 NOTE — Telephone Encounter (Signed)
Had a question about how much testosterone to draw up in the syringe.  RN verbally reviewed the markings on the syringe and the pt was able to accurately verbalize how much to draw up in the syringe.  RN advised to call anytime he had a question about this or any new medication.

## 2012-08-28 NOTE — Telephone Encounter (Signed)
Pt started on testosterone injections by MD.  Needed rx for syringes.

## 2012-08-30 ENCOUNTER — Telehealth: Payer: Self-pay | Admitting: *Deleted

## 2012-08-30 NOTE — Telephone Encounter (Signed)
Patient called stating that after administering the serostim he took his HIV meds and became nauseous.  Advised to space it out and see if that helps. Wendall Mola CMA

## 2012-09-05 ENCOUNTER — Telehealth: Payer: Self-pay | Admitting: *Deleted

## 2012-09-05 NOTE — Telephone Encounter (Signed)
Patient called and asked if we had our flu shots in and advised him yes. Put him on the schedule and he also advised he needs his meds refilled that day also.

## 2012-09-09 ENCOUNTER — Telehealth: Payer: Self-pay

## 2012-09-09 NOTE — Telephone Encounter (Signed)
Message left on triage voicemail: questions regarding Serostim side effects.   Return call:  No answer Laurell Josephs, RN

## 2012-09-10 ENCOUNTER — Telehealth: Payer: Self-pay | Admitting: *Deleted

## 2012-09-10 NOTE — Telephone Encounter (Signed)
Patient called c/o side effects from serostim, he said it caused extreme nausea and nightmares.  He is not going to take it anymore and wanted Dr. Daiva Eves to know. Wendall Mola CMA

## 2012-09-12 ENCOUNTER — Other Ambulatory Visit: Payer: Self-pay

## 2012-09-12 ENCOUNTER — Ambulatory Visit (INDEPENDENT_AMBULATORY_CARE_PROVIDER_SITE_OTHER): Payer: Medicaid Other

## 2012-09-12 DIAGNOSIS — M545 Low back pain, unspecified: Secondary | ICD-10-CM

## 2012-09-12 DIAGNOSIS — Z23 Encounter for immunization: Secondary | ICD-10-CM

## 2012-09-12 MED ORDER — MORPHINE SULFATE 15 MG PO TABS: 15.0000 mg | ORAL_TABLET | Freq: Three times a day (TID) | ORAL | Status: DC | PRN

## 2012-09-12 MED ORDER — MORPHINE SULFATE ER 30 MG PO TBCR: 30.0000 mg | EXTENDED_RELEASE_TABLET | Freq: Two times a day (BID) | ORAL | Status: DC

## 2012-09-12 NOTE — Telephone Encounter (Signed)
Pt stated he called last week for narcotic refills for today.  His script was not ready and his physician is not here today . I will ask Dr Drue Second to sign scripts.   Laurell Josephs, RN

## 2012-09-18 ENCOUNTER — Telehealth: Payer: Self-pay | Admitting: *Deleted

## 2012-09-18 NOTE — Telephone Encounter (Signed)
Pt called c/o "dreams" after beginning "growth hormone" injections.  Pt wondering whether decreasing dose would improve the "dreams."  MD please advise or call pt @ [phone].

## 2012-09-20 NOTE — Telephone Encounter (Signed)
Not sure that this would be causing his vivid dreams. He can cut the dose back by 2mg  or so and see what happens

## 2012-09-24 ENCOUNTER — Telehealth: Payer: Self-pay | Admitting: *Deleted

## 2012-09-24 NOTE — Telephone Encounter (Signed)
Patient called and said he has stopped the serostim completely for now because of weird dreams, doesn't feel right and he noticed some discoloration on his leg. He has gained 8 lbs. He is willing to cut back on it by half which would be easier for him to measure on the syringe than cutting back 2mg , so he would be taking 3 mg instead of 6mg . He will wait until after his mother has surgery for breast cancer because he didn't feel like he could deal with it now.

## 2012-09-24 NOTE — Telephone Encounter (Signed)
Pt to decrease dose to 4 mg daily.  Pt verbalized understanding.  He has already gained 8 pounds.  Pt shared that his mother was diagnosed with cancer yesterday.  Pt stated that he is dealing with this news and his own issues as well as he can.

## 2012-09-25 NOTE — Telephone Encounter (Signed)
That is fine. Thanks Selena Batten

## 2012-10-07 ENCOUNTER — Other Ambulatory Visit: Payer: Self-pay | Admitting: *Deleted

## 2012-10-07 DIAGNOSIS — M545 Low back pain, unspecified: Secondary | ICD-10-CM

## 2012-10-07 MED ORDER — MORPHINE SULFATE 15 MG PO TABS: 15.0000 mg | ORAL_TABLET | Freq: Three times a day (TID) | ORAL | Status: DC | PRN

## 2012-10-07 MED ORDER — MORPHINE SULFATE ER 30 MG PO TBCR: 30.0000 mg | EXTENDED_RELEASE_TABLET | Freq: Two times a day (BID) | ORAL | Status: DC

## 2012-11-11 ENCOUNTER — Other Ambulatory Visit: Payer: Self-pay | Admitting: Licensed Clinical Social Worker

## 2012-11-11 DIAGNOSIS — M545 Low back pain, unspecified: Secondary | ICD-10-CM

## 2012-11-11 MED ORDER — MORPHINE SULFATE 15 MG PO TABS: 15.0000 mg | ORAL_TABLET | Freq: Three times a day (TID) | ORAL | Status: DC | PRN

## 2012-11-11 MED ORDER — MORPHINE SULFATE ER 30 MG PO TBCR: 30.0000 mg | EXTENDED_RELEASE_TABLET | Freq: Two times a day (BID) | ORAL | Status: DC

## 2012-11-12 ENCOUNTER — Other Ambulatory Visit: Payer: Self-pay | Admitting: Infectious Disease

## 2012-11-12 DIAGNOSIS — R630 Anorexia: Secondary | ICD-10-CM

## 2012-11-12 MED ORDER — DRONABINOL 10 MG PO CAPS: 10.0000 mg | ORAL_CAPSULE | Freq: Two times a day (BID) | ORAL | Status: DC

## 2012-12-02 ENCOUNTER — Other Ambulatory Visit: Payer: Self-pay | Admitting: Infectious Disease

## 2012-12-02 DIAGNOSIS — F419 Anxiety disorder, unspecified: Secondary | ICD-10-CM

## 2012-12-06 ENCOUNTER — Other Ambulatory Visit: Payer: Self-pay | Admitting: Infectious Disease

## 2012-12-09 ENCOUNTER — Other Ambulatory Visit: Payer: Self-pay | Admitting: Licensed Clinical Social Worker

## 2012-12-09 DIAGNOSIS — M545 Low back pain, unspecified: Secondary | ICD-10-CM

## 2012-12-09 MED ORDER — MORPHINE SULFATE 15 MG PO TABS: 15.0000 mg | ORAL_TABLET | Freq: Three times a day (TID) | ORAL | Status: DC | PRN

## 2012-12-09 MED ORDER — MORPHINE SULFATE ER 30 MG PO TBCR: 30.0000 mg | EXTENDED_RELEASE_TABLET | Freq: Two times a day (BID) | ORAL | Status: DC

## 2012-12-12 ENCOUNTER — Other Ambulatory Visit: Payer: Self-pay | Admitting: *Deleted

## 2012-12-12 ENCOUNTER — Other Ambulatory Visit: Payer: Self-pay

## 2012-12-12 DIAGNOSIS — M545 Low back pain, unspecified: Secondary | ICD-10-CM

## 2012-12-12 MED ORDER — MORPHINE SULFATE ER 30 MG PO TBCR: 30.0000 mg | EXTENDED_RELEASE_TABLET | Freq: Two times a day (BID) | ORAL | Status: DC

## 2012-12-12 NOTE — Telephone Encounter (Signed)
Reprint for unsigned script by Dr Daiva Eves  But this script did not print in clinic.  We will need to print a duplicate copy .   Laurell Josephs, RN

## 2012-12-22 ENCOUNTER — Emergency Department: Payer: Self-pay | Admitting: Emergency Medicine

## 2012-12-22 LAB — COMPREHENSIVE METABOLIC PANEL
Anion Gap: 5 — ABNORMAL LOW (ref 7–16)
Calcium, Total: 8.8 mg/dL (ref 8.5–10.1)
Chloride: 107 mmol/L (ref 98–107)
EGFR (African American): >60
EGFR (Non-African Amer.): >60
Osmolality: 282 (ref 275–301)
Potassium: 4.5 mmol/L (ref 3.5–5.1)
SGOT(AST): 31 U/L (ref 15–37)
SGPT (ALT): 16 U/L (ref 12–78)
Sodium: 142 mmol/L (ref 136–145)
Total Protein: 6.7 g/dL (ref 6.4–8.2)

## 2012-12-22 LAB — URINALYSIS, COMPLETE
Bacteria: NONE SEEN
Bilirubin,UR: NEGATIVE
Glucose,UR: NEGATIVE mg/dL (ref 0–75)
Ketone: NEGATIVE
Leukocyte Esterase: NEGATIVE
Protein: NEGATIVE
RBC,UR: <1 /HPF (ref 0–5)
Squamous Epithelial: NONE SEEN

## 2012-12-22 LAB — CBC
HCT: 41.6 % (ref 40.0–52.0)
HGB: 14.6 g/dL (ref 13.0–18.0)
MCH: 33.9 pg (ref 26.0–34.0)
MCHC: 35.1 g/dL (ref 32.0–36.0)
Platelet: 238 10*3/uL (ref 150–440)
RBC: 4.31 10*6/uL — ABNORMAL LOW (ref 4.40–5.90)
RDW: 12.7 % (ref 11.5–14.5)

## 2012-12-22 LAB — CK TOTAL AND CKMB (NOT AT ARMC)
CK, Total: 83 U/L (ref 35–232)
CK-MB: 1.2 ng/mL (ref 0.5–3.6)

## 2012-12-22 LAB — TROPONIN I: Troponin-I: <0.02 ng/mL

## 2012-12-30 ENCOUNTER — Ambulatory Visit: Payer: Medicaid Other | Admitting: Infectious Disease

## 2013-01-01 ENCOUNTER — Other Ambulatory Visit (INDEPENDENT_AMBULATORY_CARE_PROVIDER_SITE_OTHER): Payer: Medicaid Other

## 2013-01-01 DIAGNOSIS — B20 Human immunodeficiency virus [HIV] disease: Secondary | ICD-10-CM

## 2013-01-01 LAB — CBC WITH DIFFERENTIAL/PLATELET
Eosinophils Absolute: 0.1 10*3/uL (ref 0.0–0.7)
Eosinophils Relative: 1 % (ref 0–5)
HCT: 43.9 % (ref 39.0–52.0)
Lymphocytes Relative: 27 % (ref 12–46)
Lymphs Abs: 1.6 10*3/uL (ref 0.7–4.0)
MCH: 33.1 pg (ref 26.0–34.0)
MCV: 93.8 fL (ref 78.0–100.0)
Monocytes Absolute: 0.7 10*3/uL (ref 0.1–1.0)
Monocytes Relative: 12 % (ref 3–12)
RBC: 4.68 MIL/uL (ref 4.22–5.81)
WBC: 5.8 10*3/uL (ref 4.0–10.5)

## 2013-01-02 LAB — COMPLETE METABOLIC PANEL WITH GFR
ALT: 10 U/L (ref 0–53)
Albumin: 4.1 g/dL (ref 3.5–5.2)
Alkaline Phosphatase: 52 U/L (ref 39–117)
CO2: 30 mEq/L (ref 19–32)
Glucose, Bld: 119 mg/dL — ABNORMAL HIGH (ref 70–99)
Potassium: 4.3 mEq/L (ref 3.5–5.3)
Sodium: 142 mEq/L (ref 135–145)
Total Bilirubin: 0.5 mg/dL (ref 0.3–1.2)
Total Protein: 6.6 g/dL (ref 6.0–8.3)

## 2013-01-02 LAB — LIPID PANEL
HDL: 43 mg/dL (ref >39)
LDL Cholesterol: 75 mg/dL (ref 0–99)
Total CHOL/HDL Ratio: 3.3 Ratio

## 2013-01-07 ENCOUNTER — Other Ambulatory Visit: Payer: Self-pay | Admitting: Licensed Clinical Social Worker

## 2013-01-07 DIAGNOSIS — J309 Allergic rhinitis, unspecified: Secondary | ICD-10-CM

## 2013-01-07 DIAGNOSIS — M545 Low back pain, unspecified: Secondary | ICD-10-CM

## 2013-01-07 MED ORDER — MORPHINE SULFATE ER 30 MG PO TBCR: 30.0000 mg | EXTENDED_RELEASE_TABLET | Freq: Two times a day (BID) | ORAL | Status: DC

## 2013-01-07 MED ORDER — MORPHINE SULFATE 15 MG PO TABS: 15.0000 mg | ORAL_TABLET | Freq: Three times a day (TID) | ORAL | Status: DC | PRN

## 2013-01-15 ENCOUNTER — Ambulatory Visit: Payer: Medicaid Other | Admitting: Infectious Disease

## 2013-01-22 ENCOUNTER — Ambulatory Visit: Payer: Medicaid Other | Admitting: Infectious Disease

## 2013-01-27 ENCOUNTER — Other Ambulatory Visit: Payer: Self-pay | Admitting: Infectious Disease

## 2013-01-30 ENCOUNTER — Ambulatory Visit (INDEPENDENT_AMBULATORY_CARE_PROVIDER_SITE_OTHER): Payer: Medicaid Other | Admitting: Infectious Disease

## 2013-01-30 ENCOUNTER — Encounter: Payer: Self-pay | Admitting: Infectious Disease

## 2013-01-30 VITALS — BP 131/73 | HR 80 | Temp 97.3°F | Wt 133.0 lb

## 2013-01-30 DIAGNOSIS — F3289 Other specified depressive episodes: Secondary | ICD-10-CM

## 2013-01-30 DIAGNOSIS — B20 Human immunodeficiency virus [HIV] disease: Secondary | ICD-10-CM

## 2013-01-30 DIAGNOSIS — E881 Lipodystrophy, not elsewhere classified: Secondary | ICD-10-CM

## 2013-01-30 DIAGNOSIS — F329 Major depressive disorder, single episode, unspecified: Secondary | ICD-10-CM

## 2013-01-30 DIAGNOSIS — F32A Depression, unspecified: Secondary | ICD-10-CM

## 2013-01-30 NOTE — Progress Notes (Signed)
  Subjective:    Patient ID: Albert Wong, male    DOB: 1956/05/10, 57 y.o.   MRN: [id number]  HPI  Albert Wong is a 57 y.o. male with HIV infection who is doing superbly well on his  antiviral regimen, atripla with undetectable viral load and health cd4 count. He had some adverse GI side effects related to his human growth hormone injection that has improved once he went to injection in his thigh. He also has had troubles with depressive ssx related to illness with his mother.   Review of Systems  Constitutional: Negative for fever, chills, diaphoresis, activity change, appetite change, fatigue and unexpected weight change.  HENT: Negative for congestion, sore throat, rhinorrhea, sneezing, trouble swallowing and sinus pressure.   Eyes: Negative for photophobia and visual disturbance.  Respiratory: Negative for cough, chest tightness, shortness of breath, wheezing and stridor.   Cardiovascular: Negative for chest pain, palpitations and leg swelling.  Gastrointestinal: Negative for nausea, vomiting, abdominal pain, diarrhea, constipation, blood in stool, abdominal distention and anal bleeding.  Genitourinary: Negative for dysuria, hematuria, flank pain and difficulty urinating.  Musculoskeletal: Negative for myalgias, back pain, joint swelling, arthralgias and gait problem.  Skin: Negative for color change, pallor, rash and wound.  Neurological: Negative for dizziness, tremors, weakness and light-headedness.  Hematological: Negative for adenopathy. Does not bruise/bleed easily.  Psychiatric/Behavioral: Positive for dysphoric mood. Negative for behavioral problems, confusion, sleep disturbance, decreased concentration and agitation.       Objective:   Physical Exam  Constitutional: He is oriented to person, place, and time. He appears well-developed and well-nourished. No distress.  HENT:  Head: Normocephalic and atraumatic.  Mouth/Throat: Oropharynx is clear and moist. No oropharyngeal  exudate.  Eyes: Conjunctivae normal and EOM are normal. Pupils are equal, round, and reactive to light. No scleral icterus.  Neck: Normal range of motion. Neck supple. No JVD present.  Cardiovascular: Normal rate, regular rhythm and normal heart sounds.  Exam reveals no gallop and no friction rub.   No murmur heard. Pulmonary/Chest: Effort normal and breath sounds normal. No respiratory distress. He has no wheezes. He has no rales. He exhibits no tenderness.  Abdominal: He exhibits no distension and no mass. There is no tenderness. There is no rebound and no guarding.  Musculoskeletal: He exhibits no edema and no tenderness.  Lymphadenopathy:    He has no cervical adenopathy.  Neurological: He is alert and oriented to person, place, and time. He has normal reflexes. He exhibits normal muscle tone. Coordination normal.  Skin: Skin is warm and dry. He is not diaphoretic. No erythema. No pallor.  Psychiatric: He has a normal mood and affect. His behavior is normal. Judgment and thought content normal.          Assessment & Plan:  HIV: perfect control, continue atripla  Lipodystrophy: continue growth hormone rx  Depression: he is not wanting SSRI, uses benzo to rx anxiety. Offered counselling but did not wish for this at this point.

## 2013-02-08 ENCOUNTER — Other Ambulatory Visit: Payer: Self-pay | Admitting: Infectious Disease

## 2013-02-10 ENCOUNTER — Other Ambulatory Visit: Payer: Self-pay | Admitting: Licensed Clinical Social Worker

## 2013-02-10 DIAGNOSIS — J302 Other seasonal allergic rhinitis: Secondary | ICD-10-CM

## 2013-02-10 MED ORDER — OLOPATADINE HCL 0.2 % OP SOLN: 1.0000 [drp] | Freq: Two times a day (BID) | OPHTHALMIC | Status: DC

## 2013-02-12 ENCOUNTER — Other Ambulatory Visit: Payer: Self-pay | Admitting: *Deleted

## 2013-02-12 DIAGNOSIS — M545 Low back pain, unspecified: Secondary | ICD-10-CM

## 2013-02-12 MED ORDER — MORPHINE SULFATE ER 30 MG PO TBCR: 30.0000 mg | EXTENDED_RELEASE_TABLET | Freq: Two times a day (BID) | ORAL | Status: DC

## 2013-02-12 MED ORDER — MORPHINE SULFATE 15 MG PO TABS: 15.0000 mg | ORAL_TABLET | Freq: Three times a day (TID) | ORAL | Status: DC | PRN

## 2013-03-11 ENCOUNTER — Other Ambulatory Visit: Payer: Self-pay | Admitting: *Deleted

## 2013-03-11 DIAGNOSIS — M545 Low back pain, unspecified: Secondary | ICD-10-CM

## 2013-03-11 MED ORDER — MORPHINE SULFATE ER 30 MG PO TBCR: 30.0000 mg | EXTENDED_RELEASE_TABLET | Freq: Two times a day (BID) | ORAL | Status: DC

## 2013-03-11 MED ORDER — MORPHINE SULFATE 15 MG PO TABS: 15.0000 mg | ORAL_TABLET | Freq: Three times a day (TID) | ORAL | Status: DC | PRN

## 2013-03-29 ENCOUNTER — Other Ambulatory Visit: Payer: Self-pay | Admitting: Infectious Disease

## 2013-03-31 ENCOUNTER — Other Ambulatory Visit: Payer: Self-pay | Admitting: *Deleted

## 2013-03-31 DIAGNOSIS — F419 Anxiety disorder, unspecified: Secondary | ICD-10-CM

## 2013-03-31 MED ORDER — DIAZEPAM 10 MG PO TABS: ORAL_TABLET | ORAL | Status: DC

## 2013-04-05 ENCOUNTER — Other Ambulatory Visit: Payer: Self-pay | Admitting: Infectious Disease

## 2013-04-07 ENCOUNTER — Telehealth: Payer: Self-pay | Admitting: *Deleted

## 2013-04-07 NOTE — Telephone Encounter (Signed)
Patient called to ask if a B12 injection would be ok for him to have. Advised him will have to ask his doctor as he on several medications and has concerns of his own. He advised ok and to just call him back once we hear something. He also advised he wants to pick up his pain Rx's on Wednesday 04/10/13 if possible. Advised him will ask the doctor and get back to him asap.

## 2013-04-09 ENCOUNTER — Other Ambulatory Visit: Payer: Self-pay | Admitting: *Deleted

## 2013-04-09 DIAGNOSIS — M545 Low back pain, unspecified: Secondary | ICD-10-CM

## 2013-04-09 MED ORDER — MORPHINE SULFATE 15 MG PO TABS: 15.0000 mg | ORAL_TABLET | Freq: Three times a day (TID) | ORAL | Status: DC | PRN

## 2013-04-09 MED ORDER — MORPHINE SULFATE ER 30 MG PO TBCR: 30.0000 mg | EXTENDED_RELEASE_TABLET | Freq: Two times a day (BID) | ORAL | Status: DC

## 2013-04-09 NOTE — Telephone Encounter (Signed)
I am fine with giving him b12 injections provided he has need for this (hx of deficiency)

## 2013-04-14 ENCOUNTER — Encounter: Payer: Self-pay | Admitting: *Deleted

## 2013-05-07 ENCOUNTER — Encounter: Payer: Self-pay | Admitting: Infectious Disease

## 2013-05-07 ENCOUNTER — Ambulatory Visit (INDEPENDENT_AMBULATORY_CARE_PROVIDER_SITE_OTHER): Payer: Medicaid Other | Admitting: Infectious Disease

## 2013-05-07 VITALS — BP 116/81 | HR 82 | Temp 98.2°F | Ht 69.0 in | Wt 132.0 lb

## 2013-05-07 DIAGNOSIS — E881 Lipodystrophy, not elsewhere classified: Secondary | ICD-10-CM

## 2013-05-07 DIAGNOSIS — E291 Testicular hypofunction: Secondary | ICD-10-CM

## 2013-05-07 DIAGNOSIS — B20 Human immunodeficiency virus [HIV] disease: Secondary | ICD-10-CM

## 2013-05-07 DIAGNOSIS — R109 Unspecified abdominal pain: Secondary | ICD-10-CM

## 2013-05-07 DIAGNOSIS — B1920 Unspecified viral hepatitis C without hepatic coma: Secondary | ICD-10-CM

## 2013-05-07 DIAGNOSIS — M545 Low back pain, unspecified: Secondary | ICD-10-CM

## 2013-05-07 DIAGNOSIS — R197 Diarrhea, unspecified: Secondary | ICD-10-CM

## 2013-05-07 DIAGNOSIS — R634 Abnormal weight loss: Secondary | ICD-10-CM

## 2013-05-07 MED ORDER — MORPHINE SULFATE ER 30 MG PO TBCR: 30.0000 mg | EXTENDED_RELEASE_TABLET | Freq: Two times a day (BID) | ORAL | Status: DC

## 2013-05-07 MED ORDER — TESTOSTERONE CYPIONATE 200 MG/ML IM SOLN: 50.0000 mg | INTRAMUSCULAR | Status: DC

## 2013-05-07 MED ORDER — DICYCLOMINE HCL 10 MG PO CAPS: 10.0000 mg | ORAL_CAPSULE | Freq: Four times a day (QID) | ORAL | Status: DC

## 2013-05-07 MED ORDER — MORPHINE SULFATE 15 MG PO TABS: 15.0000 mg | ORAL_TABLET | Freq: Three times a day (TID) | ORAL | Status: DC | PRN

## 2013-05-07 NOTE — Progress Notes (Signed)
  Subjective:    Patient ID: Albert Wong, male    DOB: 1956/09/30, 57 y.o.   MRN: [id number]  HPI  Albert Wong is a 57 y.o. male with HIV infection who is doing superbly well on his  antiviral regimen, atripla with undetectable viral load and health cd4 count.   He continues to lose weight. He continues to suffer from loose bm. He stopped his growth hormone as he thought it was causing arthritis and with dc of drug arthritic ssx ceased.  He has not refilled his testosterone IM injections. He claims to have cramping abd pains at times. I offered him antispasmodic and referral to GI   Review of Systems  Constitutional: Positive for appetite change and unexpected weight change. Negative for fever, chills, diaphoresis, activity change and fatigue.  HENT: Negative for congestion, sore throat, rhinorrhea, sneezing, trouble swallowing and sinus pressure.   Eyes: Negative for photophobia and visual disturbance.  Respiratory: Negative for cough, chest tightness, shortness of breath, wheezing and stridor.   Cardiovascular: Negative for chest pain, palpitations and leg swelling.  Gastrointestinal: Positive for nausea, abdominal pain, diarrhea and abdominal distention. Negative for vomiting, constipation, blood in stool and anal bleeding.  Genitourinary: Negative for dysuria, hematuria, flank pain and difficulty urinating.  Musculoskeletal: Negative for myalgias, back pain, joint swelling, arthralgias and gait problem.  Skin: Negative for color change, pallor, rash and wound.  Neurological: Negative for dizziness, tremors, weakness and light-headedness.  Hematological: Negative for adenopathy. Does not bruise/bleed easily.  Psychiatric/Behavioral: Positive for dysphoric mood. Negative for behavioral problems, confusion, sleep disturbance, decreased concentration and agitation.       Objective:   Physical Exam  Constitutional: He is oriented to person, place, and time. He appears well-developed  and well-nourished. No distress.  HENT:  Head: Normocephalic and atraumatic.  Mouth/Throat: Oropharynx is clear and moist. No oropharyngeal exudate.  Eyes: Conjunctivae and EOM are normal. Pupils are equal, round, and reactive to light. No scleral icterus.  Neck: Normal range of motion. Neck supple. No JVD present.  Cardiovascular: Normal rate, regular rhythm and normal heart sounds.  Exam reveals no gallop and no friction rub.   No murmur heard. Pulmonary/Chest: Effort normal and breath sounds normal. No respiratory distress. He has no wheezes. He has no rales. He exhibits no tenderness.  Abdominal: He exhibits no distension and no mass. There is no tenderness. There is no rebound and no guarding.  Musculoskeletal: He exhibits no edema and no tenderness.  Lymphadenopathy:    He has no cervical adenopathy.  Neurological: He is alert and oriented to person, place, and time. He has normal reflexes. He exhibits normal muscle tone. Coordination normal.  Skin: Skin is warm and dry. He is not diaphoretic. No erythema. No pallor.  Psychiatric: He has a normal mood and affect. His behavior is normal. Judgment and thought content normal.          Assessment & Plan:  HIV: perfect control, continue atripla  Lipodystrophy: renew testostorone. He did not tolerate HGH  Depression: feels is relatively stable. Is also largely related to stress with his mom  Weight loss; much seems to be related To GI ssx. Will consider referrlal to GI Will consider pan ct scan if ssx persist  Hep C: check genotype and consider for ACTG trial , referral to Dr. Hamilton Capri  Low testosteorne: restart meds  Chronc lumbago: refill narcotics via pain contract

## 2013-06-09 ENCOUNTER — Other Ambulatory Visit: Payer: Self-pay | Admitting: Licensed Clinical Social Worker

## 2013-06-09 DIAGNOSIS — M545 Low back pain, unspecified: Secondary | ICD-10-CM

## 2013-06-09 MED ORDER — MORPHINE SULFATE ER 30 MG PO TBCR: 30.0000 mg | EXTENDED_RELEASE_TABLET | Freq: Two times a day (BID) | ORAL | Status: DC

## 2013-06-09 MED ORDER — MORPHINE SULFATE 15 MG PO TABS: 15.0000 mg | ORAL_TABLET | Freq: Three times a day (TID) | ORAL | Status: DC | PRN

## 2013-06-20 ENCOUNTER — Other Ambulatory Visit: Payer: Self-pay | Admitting: Infectious Disease

## 2013-06-20 DIAGNOSIS — B20 Human immunodeficiency virus [HIV] disease: Secondary | ICD-10-CM

## 2013-07-08 ENCOUNTER — Other Ambulatory Visit: Payer: Self-pay | Admitting: Licensed Clinical Social Worker

## 2013-07-08 DIAGNOSIS — M545 Low back pain, unspecified: Secondary | ICD-10-CM

## 2013-07-08 MED ORDER — MORPHINE SULFATE ER 30 MG PO TBCR: 30.0000 mg | EXTENDED_RELEASE_TABLET | Freq: Two times a day (BID) | ORAL | Status: DC

## 2013-07-08 MED ORDER — MORPHINE SULFATE 15 MG PO TABS: 15.0000 mg | ORAL_TABLET | Freq: Three times a day (TID) | ORAL | Status: DC | PRN

## 2013-07-23 ENCOUNTER — Emergency Department: Payer: Self-pay | Admitting: Emergency Medicine

## 2013-07-23 ENCOUNTER — Other Ambulatory Visit: Payer: Medicaid Other

## 2013-07-23 LAB — COMPREHENSIVE METABOLIC PANEL
Alkaline Phosphatase: 74 U/L (ref 50–136)
Anion Gap: 3 — ABNORMAL LOW (ref 7–16)
Bilirubin,Total: 0.4 mg/dL (ref 0.2–1.0)
Chloride: 106 mmol/L (ref 98–107)
Co2: 31 mmol/L (ref 21–32)
EGFR (African American): >60
EGFR (Non-African Amer.): >60
Glucose: 108 mg/dL — ABNORMAL HIGH (ref 65–99)
Osmolality: 280 (ref 275–301)
Potassium: 4.3 mmol/L (ref 3.5–5.1)
SGOT(AST): 39 U/L — ABNORMAL HIGH (ref 15–37)
Sodium: 140 mmol/L (ref 136–145)
Total Protein: 7 g/dL (ref 6.4–8.2)

## 2013-07-23 LAB — URINALYSIS, COMPLETE
Bacteria: NONE SEEN
Bilirubin,UR: NEGATIVE
Blood: NEGATIVE
Leukocyte Esterase: NEGATIVE
Nitrite: NEGATIVE
Ph: 7 (ref 4.5–8.0)
Protein: NEGATIVE
RBC,UR: <1 /HPF (ref 0–5)
WBC UR: NONE SEEN /HPF (ref 0–5)

## 2013-07-23 LAB — CBC WITH DIFFERENTIAL/PLATELET
Basophil #: 0.1 10*3/uL (ref 0.0–0.1)
Basophil %: 0.7 %
Eosinophil #: 0.1 10*3/uL (ref 0.0–0.7)
Eosinophil %: 0.6 %
Lymphocyte #: 1.7 10*3/uL (ref 1.0–3.6)
Lymphocyte %: 18.2 %
MCH: 34.3 pg — ABNORMAL HIGH (ref 26.0–34.0)
MCHC: 35.4 g/dL (ref 32.0–36.0)
Monocyte #: 0.4 x10 3/mm (ref 0.2–1.0)
Monocyte %: 4.7 %
RDW: 13.1 % (ref 11.5–14.5)

## 2013-07-29 ENCOUNTER — Other Ambulatory Visit: Payer: Self-pay | Admitting: Infectious Disease

## 2013-07-29 DIAGNOSIS — M62838 Other muscle spasm: Secondary | ICD-10-CM

## 2013-08-06 ENCOUNTER — Other Ambulatory Visit: Payer: Self-pay | Admitting: *Deleted

## 2013-08-06 ENCOUNTER — Ambulatory Visit: Payer: Medicaid Other | Admitting: Infectious Disease

## 2013-08-06 DIAGNOSIS — M545 Low back pain, unspecified: Secondary | ICD-10-CM

## 2013-08-06 MED ORDER — MORPHINE SULFATE 15 MG PO TABS: 15.0000 mg | ORAL_TABLET | Freq: Three times a day (TID) | ORAL | Status: DC | PRN

## 2013-08-06 MED ORDER — MORPHINE SULFATE ER 30 MG PO TBCR: 30.0000 mg | EXTENDED_RELEASE_TABLET | Freq: Two times a day (BID) | ORAL | Status: DC

## 2013-08-06 NOTE — Telephone Encounter (Signed)
Patient called and advised he needs his medications.

## 2013-08-22 ENCOUNTER — Telehealth: Payer: Self-pay | Admitting: *Deleted

## 2013-08-22 NOTE — Telephone Encounter (Signed)
Pt picked up MS Contin rx on 08/07/13.  Pt called today to let [facility] know that this rx needs a Prior Authorization from [facility].  [facility] [address], [place], [place].  Pt presented rx to pharmacy 08/11/13.  Prior Authorization not previously received by [facility]   [facility] to send Prior Author[facility] request.  Prior Authorization form generated from [employer] and completed by [employer].  Requires MD signature prior to faxing to Medicaid for approval.  Labor Day holiday delaying signature by MD until 08/26/13.  Pt informed.

## 2013-08-26 NOTE — Telephone Encounter (Signed)
Paperwork should now be filled out

## 2013-08-27 ENCOUNTER — Other Ambulatory Visit: Payer: Self-pay | Admitting: Infectious Disease

## 2013-09-02 ENCOUNTER — Telehealth: Payer: Self-pay | Admitting: *Deleted

## 2013-09-02 NOTE — Telephone Encounter (Signed)
Error

## 2013-09-02 NOTE — Telephone Encounter (Signed)
PA form signed and faxed back to [facility] for Morphine ER 30mg  tab.  Andree Coss, RN

## 2013-09-04 ENCOUNTER — Other Ambulatory Visit: Payer: Self-pay | Admitting: Infectious Disease

## 2013-09-09 ENCOUNTER — Other Ambulatory Visit: Payer: Self-pay | Admitting: Licensed Clinical Social Worker

## 2013-09-09 DIAGNOSIS — M545 Low back pain, unspecified: Secondary | ICD-10-CM

## 2013-09-09 MED ORDER — MORPHINE SULFATE 15 MG PO TABS: 15.0000 mg | ORAL_TABLET | Freq: Three times a day (TID) | ORAL | Status: DC | PRN

## 2013-09-09 MED ORDER — MORPHINE SULFATE ER 30 MG PO TBCR: 30.0000 mg | EXTENDED_RELEASE_TABLET | Freq: Two times a day (BID) | ORAL | Status: DC

## 2013-09-10 ENCOUNTER — Ambulatory Visit (INDEPENDENT_AMBULATORY_CARE_PROVIDER_SITE_OTHER): Payer: Medicaid Other

## 2013-09-10 DIAGNOSIS — Z23 Encounter for immunization: Secondary | ICD-10-CM

## 2013-09-22 ENCOUNTER — Other Ambulatory Visit: Payer: Self-pay | Admitting: Infectious Disease

## 2013-10-07 ENCOUNTER — Other Ambulatory Visit: Payer: Self-pay | Admitting: Licensed Clinical Social Worker

## 2013-10-07 DIAGNOSIS — M545 Low back pain, unspecified: Secondary | ICD-10-CM

## 2013-10-07 MED ORDER — MORPHINE SULFATE ER 30 MG PO TBCR: 30.0000 mg | EXTENDED_RELEASE_TABLET | Freq: Two times a day (BID) | ORAL | Status: DC

## 2013-10-07 MED ORDER — MORPHINE SULFATE 15 MG PO TABS: 15.0000 mg | ORAL_TABLET | Freq: Three times a day (TID) | ORAL | Status: DC | PRN

## 2013-10-29 ENCOUNTER — Other Ambulatory Visit: Payer: Medicaid Other

## 2013-11-03 ENCOUNTER — Other Ambulatory Visit: Payer: Self-pay | Admitting: *Deleted

## 2013-11-03 DIAGNOSIS — M545 Low back pain, unspecified: Secondary | ICD-10-CM

## 2013-11-03 MED ORDER — MORPHINE SULFATE 15 MG PO TABS: 15.0000 mg | ORAL_TABLET | Freq: Three times a day (TID) | ORAL | Status: DC | PRN

## 2013-11-03 MED ORDER — MORPHINE SULFATE ER 30 MG PO TBCR: 30.0000 mg | EXTENDED_RELEASE_TABLET | Freq: Two times a day (BID) | ORAL | Status: AC

## 2013-11-12 ENCOUNTER — Ambulatory Visit: Payer: Medicaid Other | Admitting: Infectious Disease

## 2013-11-16 ENCOUNTER — Other Ambulatory Visit: Payer: Self-pay | Admitting: Infectious Disease

## 2013-11-28 ENCOUNTER — Other Ambulatory Visit: Payer: Self-pay | Admitting: Infectious Disease

## 2013-11-28 DIAGNOSIS — R7989 Other specified abnormal findings of blood chemistry: Secondary | ICD-10-CM

## 2013-12-08 ENCOUNTER — Telehealth: Payer: Self-pay | Admitting: *Deleted

## 2013-12-09 NOTE — Telephone Encounter (Signed)
I DO think he should see an ID doc. If he has insurance he could see Dr Sampson Goon in the [place] [facility] and Theodoro Grist can take care of everything..though we should give Dr. Sampson Goon heads up I am sure he would love to take care of him. He needs an ID MD twice a year period

## 2013-12-09 NOTE — Telephone Encounter (Addendum)
Albert Wong called and said he had a regular PCP now at [facility] in [place] and was also being see in in a pain clinic over there. He said he thought he didn't need to see ID anymore but I told him he probably should come once or twice a year for labs and checkup for his HIV meds, because I doubted that they would prescribe his antiretrovirals. He said he was doing well and had put some weight back on. He wanted me to let Dr. Daiva Eves know.

## 2013-12-29 ENCOUNTER — Other Ambulatory Visit: Payer: Self-pay | Admitting: Infectious Disease

## 2013-12-29 DIAGNOSIS — B20 Human immunodeficiency virus [HIV] disease: Secondary | ICD-10-CM

## 2014-03-24 ENCOUNTER — Other Ambulatory Visit: Payer: Self-pay | Admitting: *Deleted

## 2014-03-24 ENCOUNTER — Telehealth: Payer: Self-pay | Admitting: *Deleted

## 2014-03-24 ENCOUNTER — Other Ambulatory Visit: Payer: Self-pay | Admitting: Infectious Disease

## 2014-03-24 DIAGNOSIS — B20 Human immunodeficiency virus [HIV] disease: Secondary | ICD-10-CM

## 2014-03-24 NOTE — Telephone Encounter (Signed)
Patient had to change providers d/t insurance.  Patient is now seeing Dr. Ola Spurr in [place].

## 2014-04-15 ENCOUNTER — Other Ambulatory Visit: Payer: Self-pay | Admitting: Infectious Disease

## 2014-05-11 ENCOUNTER — Encounter: Payer: Self-pay | Admitting: *Deleted

## 2014-05-11 NOTE — Telephone Encounter (Signed)
Error

## 2014-05-15 ENCOUNTER — Other Ambulatory Visit: Payer: Self-pay | Admitting: Infectious Disease

## 2014-05-20 ENCOUNTER — Telehealth: Payer: Self-pay | Admitting: *Deleted

## 2014-05-20 NOTE — Telephone Encounter (Signed)
Pt has transferred care to Dr. Ola Spurr in [place].

## 2015-06-21 ENCOUNTER — Other Ambulatory Visit: Payer: Self-pay | Admitting: Infectious Diseases

## 2015-06-21 DIAGNOSIS — B182 Chronic viral hepatitis C: Secondary | ICD-10-CM

## 2015-06-21 DIAGNOSIS — B20 Human immunodeficiency virus [HIV] disease: Secondary | ICD-10-CM

## 2015-07-01 ENCOUNTER — Ambulatory Visit: Payer: Medicaid Other

## 2015-07-02 ENCOUNTER — Ambulatory Visit
Admission: RE | Admit: 2015-07-02 | Discharge: 2015-07-02 | Disposition: A | Discharge status: Home / Self Care | Payer: Medicaid Other | Source: Ambulatory Visit | Attending: Infectious Diseases | Admitting: Infectious Diseases

## 2015-07-02 DIAGNOSIS — Z21 Asymptomatic human immunodeficiency virus [HIV] infection status: Secondary | ICD-10-CM | POA: Diagnosis present

## 2015-07-02 DIAGNOSIS — B20 Human immunodeficiency virus [HIV] disease: Secondary | ICD-10-CM

## 2015-07-02 DIAGNOSIS — B182 Chronic viral hepatitis C: Secondary | ICD-10-CM | POA: Diagnosis present

## 2020-04-09 ENCOUNTER — Other Ambulatory Visit: Payer: Self-pay | Admitting: Family Medicine

## 2020-04-09 ENCOUNTER — Telehealth: Payer: Self-pay | Admitting: *Deleted

## 2020-04-09 ENCOUNTER — Encounter: Payer: Self-pay | Admitting: *Deleted

## 2020-04-09 DIAGNOSIS — Z87891 Personal history of nicotine dependence: Secondary | ICD-10-CM

## 2020-04-09 DIAGNOSIS — Z13820 Encounter for screening for osteoporosis: Secondary | ICD-10-CM

## 2020-04-09 NOTE — Telephone Encounter (Signed)
Received referral for initial lung cancer screening scan. Contacted patient and obtained smoking history,(current, 48.75 pack year) as well as answering questions related to screening process. Patient denies signs of lung cancer such as weight loss or hemoptysis. Patient denies comorbidity that would prevent curative treatment if lung cancer were found. Patient is scheduled for shared decision making visit and CT scan on 04/14/20 at 1015am.

## 2020-04-14 ENCOUNTER — Ambulatory Visit
Admission: RE | Admit: 2020-04-14 | Discharge: 2020-04-14 | Disposition: A | Discharge status: Home / Self Care | Payer: Medicare Other | Source: Ambulatory Visit | Attending: Oncology | Admitting: Oncology

## 2020-04-14 ENCOUNTER — Other Ambulatory Visit: Payer: Self-pay

## 2020-04-14 ENCOUNTER — Inpatient Hospital Stay: Payer: Medicare Other | Attending: Oncology | Admitting: Hospice and Palliative Medicine

## 2020-04-14 DIAGNOSIS — Z87891 Personal history of nicotine dependence: Secondary | ICD-10-CM | POA: Diagnosis not present

## 2020-04-14 NOTE — Progress Notes (Signed)
In accordance with CMS guidelines, patient has met eligibility criteria including age, absence of signs or symptoms of lung cancer.  Social History   Tobacco Use  . Smoking status: Current Every Day Smoker    Packs/day: 1.25    Years: 39.00    Pack years: 48.75    Types: Cigarettes  . Smokeless tobacco: Never Used  . Tobacco comment: electronic cigarettes  Substance Use Topics  . Alcohol use: Yes  . Drug use: No      A shared decision-making session was conducted prior to the performance of CT scan. This includes one or more decision aids, includes benefits and harms of screening, follow-up diagnostic testing, over-diagnosis, false positive rate, and total radiation exposure.   Counseling on the importance of adherence to annual lung cancer LDCT screening, impact of co-morbidities, and ability or willingness to undergo diagnosis and treatment is imperative for compliance of the program.   Counseling on the importance of continued smoking cessation for former smokers; the importance of smoking cessation for current smokers, and information about tobacco cessation interventions have been given to patient including [facility] and 1800 quit [place] programs.   Written order for lung cancer screening with LDCT has been given to the patient and any and all questions have been answered to the best of my abilities.    Yearly follow up will be coordinated by Burgess Estelle, Thoracic Navigator.  Time Total: 15 minutes  Visit consisted of counseling and education dealing with complex health screening. Greater than 50%  of this time was spent counseling and coordinating care related to the above assessment and plan.  Signed by: Altha Harm, PhD, NP-C [phone] (Work Cell)

## 2020-04-15 ENCOUNTER — Telehealth: Payer: Self-pay | Admitting: *Deleted

## 2020-04-15 NOTE — Telephone Encounter (Signed)
Notified patient of LDCT lung cancer screening program results with recommendation for 12 month follow up imaging. Also notified of incidental findings noted below and is encouraged to discuss further with PCP who will receive a copy of this note and/or the CT report. Patient verbalizes understanding.   IMPRESSION: Lung-RADS 2, benign appearance or behavior. Continue annual screening with low-dose chest CT without contrast in 12 months.  Emphysema (ICD10-J43.9) and Aortic Atherosclerosis (ICD10-170.0)

## 2021-03-24 ENCOUNTER — Telehealth: Payer: Self-pay

## 2021-03-24 NOTE — Telephone Encounter (Signed)
Message left notifying patient that it is time to schedule the low dose lung cancer screening CT scan.  Instructed patient to return call to Shawn Perkins at [phone] to verify information prior to CT scan being scheduled.    

## 2021-03-28 DIAGNOSIS — J439 Emphysema, unspecified: Secondary | ICD-10-CM | POA: Diagnosis not present

## 2021-03-28 DIAGNOSIS — Z01818 Encounter for other preprocedural examination: Secondary | ICD-10-CM | POA: Diagnosis not present

## 2021-03-29 ENCOUNTER — Other Ambulatory Visit: Payer: Self-pay | Admitting: *Deleted

## 2021-03-29 DIAGNOSIS — Z87891 Personal history of nicotine dependence: Secondary | ICD-10-CM

## 2021-03-29 DIAGNOSIS — Z122 Encounter for screening for malignant neoplasm of respiratory organs: Secondary | ICD-10-CM

## 2021-03-29 NOTE — Progress Notes (Signed)
Contacted and scheduled for annual lung screening scan. Patient is a former smoker, quit date 02/22/21, 48.75 pack year history.

## 2021-04-05 ENCOUNTER — Other Ambulatory Visit: Payer: Self-pay

## 2021-04-05 ENCOUNTER — Ambulatory Visit
Admission: RE | Admit: 2021-04-05 | Discharge: 2021-04-05 | Disposition: A | Discharge status: Home / Self Care | Payer: Medicare Other | Source: Ambulatory Visit | Attending: Nurse Practitioner | Admitting: Nurse Practitioner

## 2021-04-05 DIAGNOSIS — Z122 Encounter for screening for malignant neoplasm of respiratory organs: Secondary | ICD-10-CM | POA: Insufficient documentation

## 2021-04-05 DIAGNOSIS — Z87891 Personal history of nicotine dependence: Secondary | ICD-10-CM | POA: Diagnosis not present

## 2021-04-06 ENCOUNTER — Other Ambulatory Visit: Payer: Self-pay

## 2021-04-06 ENCOUNTER — Encounter: Payer: Medicare Other | Attending: Pulmonary Disease

## 2021-04-06 DIAGNOSIS — J449 Chronic obstructive pulmonary disease, unspecified: Secondary | ICD-10-CM | POA: Insufficient documentation

## 2021-04-06 NOTE — Progress Notes (Signed)
Virtual Visit completed. Patient informed on EP and RD appointment and 6 Minute walk test. Patient also informed of patient health questionnaires on My Chart. Patient Verbalizes understanding. Visit diagnosis can be found in [facility] 03/28/2021.

## 2021-04-07 ENCOUNTER — Telehealth: Payer: Self-pay | Admitting: *Deleted

## 2021-04-07 NOTE — Telephone Encounter (Signed)
Notified patient of LDCT lung cancer screening program results with recommendation for 12 month follow up imaging. Also notified of incidental findings noted below and is encouraged to discuss further with PCP who will receive a copy of this note and/or the CT report. Patient verbalizes understanding.   IMPRESSION: 1. Lung-RADS 2, benign appearance or behavior. Continue annual screening with low-dose chest CT without contrast in 12 months. 2. Aortic atherosclerosis (ICD10-I70.0). Coronary artery calcification. 3.  Emphysema (ICD10-J43.9).

## 2021-04-11 DIAGNOSIS — G8929 Other chronic pain: Secondary | ICD-10-CM | POA: Diagnosis not present

## 2021-04-11 DIAGNOSIS — M545 Low back pain, unspecified: Secondary | ICD-10-CM | POA: Diagnosis not present

## 2021-04-11 DIAGNOSIS — M546 Pain in thoracic spine: Secondary | ICD-10-CM | POA: Diagnosis not present

## 2021-04-11 DIAGNOSIS — M150 Primary generalized (osteo)arthritis: Secondary | ICD-10-CM | POA: Diagnosis not present

## 2021-04-11 DIAGNOSIS — M542 Cervicalgia: Secondary | ICD-10-CM | POA: Diagnosis not present

## 2021-04-11 DIAGNOSIS — G894 Chronic pain syndrome: Secondary | ICD-10-CM | POA: Diagnosis not present

## 2021-04-18 ENCOUNTER — Other Ambulatory Visit: Payer: Self-pay

## 2021-04-18 ENCOUNTER — Telehealth: Payer: Self-pay

## 2021-04-18 VITALS — Ht 70.2 in | Wt 167.6 lb

## 2021-04-18 DIAGNOSIS — J449 Chronic obstructive pulmonary disease, unspecified: Secondary | ICD-10-CM

## 2021-04-18 NOTE — Patient Instructions (Signed)
Patient Instructions  Patient Details  Name: Albert Wong MRN: [id number] Date of Birth: 10/04/1956 Referring Provider:  Ottie Glazier, MD  Below are your personal goals for exercise, nutrition, and risk factors. Our goal is to help you stay on track towards obtaining and maintaining these goals. We will be discussing your progress on these goals with you throughout the program.  Initial Exercise Prescription:  Initial Exercise Prescription - 04/18/21 1200      Date of Initial Exercise RX and Referring Provider   Date 04/18/21    Referring Provider Ottie Glazier MD      Recumbant Bike   Level 2    RPM 60    Watts 15    Minutes 15    METs 2.65      NuStep   Level 2    SPM 80    Minutes 15    METs 2.65      Arm Ergometer   Level --    RPM --    Minutes --    METs --      T5 Nustep   Level 1    SPM 80    Minutes 15    METs 2.65      Track   Laps 25    Minutes 15    METs 2.4      Prescription Details   Frequency (times per week) 3    Duration Progress to 30 minutes of continuous aerobic without signs/symptoms of physical distress      Intensity   THRR 40-80% of Max Heartrate 107-138    Ratings of Perceived Exertion 11-13    Perceived Dyspnea 0-4      Progression   Progression Continue to progress workloads to maintain intensity without signs/symptoms of physical distress.      Resistance Training   Training Prescription Yes    Weight 3 lb    Reps 10-15           Exercise Goals: Frequency: Be able to perform aerobic exercise two to three times per week in program working toward 2-5 days per week of home exercise.  Intensity: Work with a perceived exertion of 11 (fairly light) - 15 (hard) while following your exercise prescription.  We will make changes to your prescription with you as you progress through the program.   Duration: Be able to do 30 to 45 minutes of continuous aerobic exercise in addition to a 5 minute warm-up and a 5 minute  cool-down routine.   Nutrition Goals: Your personal nutrition goals will be established when you do your nutrition analysis with the dietician.  The following are general nutrition guidelines to follow: Cholesterol < 200mg /day Sodium < 1500mg /day Fiber: Men over 50 yrs - 30 grams per day  Personal Goals:  Personal Goals and Risk Factors at Admission - 04/18/21 1310      Core Components/Risk Factors/Patient Goals on Admission    Weight Management Yes;Weight Maintenance    Intervention Weight Management: Develop a combined nutrition and exercise program designed to reach desired caloric intake, while maintaining appropriate intake of nutrient and fiber, sodium and fats, and appropriate energy expenditure required for the weight goal.;Weight Management: Provide education and appropriate resources to help participant work on and attain dietary goals.;Weight Management/Obesity: Establish reasonable short term and long term weight goals.    Admit Weight 167 lb (75.8 kg)    Goal Weight: Short Term 167 lb (75.8 kg)    Goal Weight: Long Term 167 lb (75.8  kg)    Expected Outcomes Short Term: Continue to assess and modify interventions until short term weight is achieved;Long Term: Adherence to nutrition and physical activity/exercise program aimed toward attainment of established weight goal;Weight Maintenance: Understanding of the daily nutrition guidelines, which includes 25-35% calories from fat, 7% or less cal from saturated fats, less than 200mg  cholesterol, less than 1.5gm of sodium, & 5 or more servings of fruits and vegetables daily;Understanding recommendations for meals to include 15-35% energy as protein, 25-35% energy from fat, 35-60% energy from carbohydrates, less than 200mg  of dietary cholesterol, 20-35 gm of total fiber daily;Understanding of distribution of calorie intake throughout the day with the consumption of 4-5 meals/snacks    Tobacco Cessation Yes    Number of packs per day .25     Intervention Assist the participant in steps to quit. Provide individualized education and counseling about committing to Tobacco Cessation, relapse prevention, and pharmacological support that can be provided by physician.;Advice worker, assist with locating and accessing local/national Quit Smoking programs, and support quit date choice.    Expected Outcomes Short Term: Will demonstrate readiness to quit, by selecting a quit date.;Short Term: Will quit all tobacco product use, adhering to prevention of relapse plan.;Long Term: Complete abstinence from all tobacco products for at least 12 months from quit date.    Improve shortness of breath with ADL's Yes    Intervention Provide education, individualized exercise plan and daily activity instruction to help decrease symptoms of SOB with activities of daily living.    Expected Outcomes Short Term: Improve cardiorespiratory fitness to achieve a reduction of symptoms when performing ADLs;Long Term: Be able to perform more ADLs without symptoms or delay the onset of symptoms    Lipids Yes    Intervention Provide education and support for participant on nutrition & aerobic/resistive exercise along with prescribed medications to achieve LDL 70mg , HDL >40mg .    Expected Outcomes Short Term: Participant states understanding of desired cholesterol values and is compliant with medications prescribed. Participant is following exercise prescription and nutrition guidelines.;Long Term: Cholesterol controlled with medications as prescribed, with individualized exercise RX and with personalized nutrition plan. Value goals: LDL < 70mg , HDL > 40 mg.           Tobacco Use Initial Evaluation: Social History   Tobacco Use  Smoking Status Current Every Day Smoker  . Packs/day: 1.00  . Years: 39.00  . Pack years: 39.00  . Types: Cigarettes  Smokeless Tobacco Never Used  Tobacco Comment   Wears patches. Smokes about 6 cigarettes a week.     Exercise Goals and Review:  Exercise Goals    Row Name 04/18/21 1309             Exercise Goals   Increase Physical Activity Yes       Intervention Provide advice, education, support and counseling about physical activity/exercise needs.;Develop an individualized exercise prescription for aerobic and resistive training based on initial evaluation findings, risk stratification, comorbidities and participant's personal goals.       Expected Outcomes Short Term: Attend rehab on a regular basis to increase amount of physical activity.;Long Term: Add in home exercise to make exercise part of routine and to increase amount of physical activity.;Long Term: Exercising regularly at least 3-5 days a week.       Increase Strength and Stamina Yes       Intervention Provide advice, education, support and counseling about physical activity/exercise needs.;Develop an individualized exercise prescription for aerobic and resistive  training based on initial evaluation findings, risk stratification, comorbidities and participant's personal goals.       Expected Outcomes Short Term: Increase workloads from initial exercise prescription for resistance, speed, and METs.;Short Term: Perform resistance training exercises routinely during rehab and add in resistance training at home;Long Term: Improve cardiorespiratory fitness, muscular endurance and strength as measured by increased METs and functional capacity (6MWT)       Able to understand and use rate of perceived exertion (RPE) scale Yes       Intervention Provide education and explanation on how to use RPE scale       Expected Outcomes Short Term: Able to use RPE daily in rehab to express subjective intensity level;Long Term:  Able to use RPE to guide intensity level when exercising independently       Able to understand and use Dyspnea scale Yes       Intervention Provide education and explanation on how to use Dyspnea scale       Expected Outcomes Short  Term: Able to use Dyspnea scale daily in rehab to express subjective sense of shortness of breath during exertion;Long Term: Able to use Dyspnea scale to guide intensity level when exercising independently       Knowledge and understanding of Target Heart Rate Range (THRR) Yes       Intervention Provide education and explanation of THRR including how the numbers were predicted and where they are located for reference       Expected Outcomes Short Term: Able to state/look up THRR;Short Term: Able to use daily as guideline for intensity in rehab;Long Term: Able to use THRR to govern intensity when exercising independently       Able to check pulse independently Yes       Intervention Provide education and demonstration on how to check pulse in carotid and radial arteries.;Review the importance of being able to check your own pulse for safety during independent exercise       Expected Outcomes Short Term: Able to explain why pulse checking is important during independent exercise;Long Term: Able to check pulse independently and accurately       Understanding of Exercise Prescription Yes       Intervention Provide education, explanation, and written materials on patient's individual exercise prescription       Expected Outcomes Short Term: Able to explain program exercise prescription;Long Term: Able to explain home exercise prescription to exercise independently              Copy of goals given to participant.

## 2021-04-18 NOTE — Progress Notes (Signed)
Pulmonary Individual Treatment Plan  Patient Details  Name: Albert Wong MRN: [id number] Date of Birth: [birth year] Referring Provider:   Flowsheet Row Pulmonary Rehab from 04/18/2021 in [facility] Cardiac and Pulmonary Rehab  Referring Provider Ottie Glazier MD      Initial Encounter Date:  Flowsheet Row Pulmonary Rehab from 04/18/2021 in [facility] Cardiac and Pulmonary Rehab  Date 04/18/21      Visit Diagnosis: Chronic obstructive pulmonary disease, unspecified COPD type ([place])  Patient's Home Medications on Admission:  Current Outpatient Medications:  .  amLODipine (NORVASC) 5 MG tablet, TAKE 1 TABLET BY MOUTH ONCE A DAY (Patient not taking: Reported on 04/06/2021), Disp: 30 tablet, Rfl: 11 .  amLODipine (NORVASC) 5 MG tablet, Take by mouth., Disp: , Rfl:  .  atorvastatin (LIPITOR) 10 MG tablet, Lipitor 10MG  Oral Tablet QTY: 90 tablet Days: 90 Refills: 0  Written: 01/08/17 Patient Instructions: TAKE 1 TABLET AT BEDTIME, Disp: , Rfl:  .  B-D 3CC LUER-LOK SYR 21GX1" 21G X 1" 3 ML MISC, USE AS DIRECTED WITH TESTOSTERONE, Disp: 24 each, Rfl: 6 .  baclofen (LIORESAL) 10 MG tablet, TAKE 1 TABLET BY MOUTH EVERY DAY, Disp: 30 tablet, Rfl: 11 .  baclofen (LIORESAL) 10 MG tablet, Take by mouth. (Patient not taking: Reported on 04/06/2021), Disp: , Rfl:  .  Budeson-Glycopyrrol-Formoterol (BREZTRI AEROSPHERE) 160-9-4.8 MCG/ACT AERO, INHALE 2 INHALATIONS INTO THE LUNGS TWICE DAILY, Disp: , Rfl:  .  cromolyn (OPTICROM) 4 % ophthalmic solution, SMARTSIG:1-2 Drop(s) In Eye(s) 4-6 Times Daily, Disp: , Rfl:  .  diazepam (VALIUM) 10 MG tablet, TAKE 1 TABLET BY MOUTH EVERY 6 HOURS AS NEEDED (Patient not taking: Reported on 04/06/2021), Disp: 30 tablet, Rfl: 3 .  diazepam (VALIUM) 10 MG tablet, Take by mouth., Disp: , Rfl:  .  dicyclomine (BENTYL) 10 MG capsule, Take 1 capsule (10 mg total) by mouth 4 (four) times daily., Disp: 120 capsule, Rfl: 5 .  ENSURE (ENSURE), Take 237 mLs by mouth 2 (two) times daily between  meals., Disp: 237 mL, Rfl: 11 .  fluticasone (FLONASE) 50 MCG/ACT nasal spray, SQUIRT 2 SPRAYS EACH NOSTRIL ONCE A DAY, Disp: 16 g, Rfl: 6 .  Ipratropium-Albuterol (COMBIVENT) 20-100 MCG/ACT AERS respimat, Inhale into the lungs., Disp: , Rfl:  .  ipratropium-albuterol (DUONEB) 0.5-2.5 (3) MG/3ML SOLN, Inhale into the lungs., Disp: , Rfl:  .  lubiprostone (AMITIZA) 24 MCG capsule, Take 24 mcg by mouth 2 (two) times daily., Disp: , Rfl:  .  MARINOL 10 MG capsule, TAKE ONE CAPSULE BY MOUTH TWICE A DAY BEFORE MEALS (Patient not taking: Reported on 04/06/2021), Disp: 60 capsule, Rfl: 1 .  Melatonin 5 MG CAPS, Take 1 capsule by mouth at bedtime., Disp: , Rfl:  .  morphine (MS CONTIN) 30 MG 12 hr tablet, Take 1 tablet (30 mg total) by mouth 2 (two) times daily., Disp: 60 tablet, Rfl: 0 .  morphine (MSIR) 15 MG tablet, Take 1 tablet (15 mg total) by mouth 3 (three) times daily as needed. (Patient not taking: Reported on 04/06/2021), Disp: 90 tablet, Rfl: 0 .  morphine (MSIR) 30 MG tablet, Take by mouth. (Patient not taking: Reported on 04/06/2021), Disp: , Rfl:  .  Olopatadine HCl (PATADAY) 0.2 % SOLN, Place 1 drop into both eyes 2 (two) times daily. (Patient not taking: Reported on 04/06/2021), Disp: 2.5 mL, Rfl: 4 .  PATADAY 0.2 % SOLN, PLACE 1 DROP INTO BOTH EYES 2 TIMES A DAY (Patient not taking: Reported on 04/06/2021), Disp: 5 mL,  Rfl: 3 .  pregabalin (LYRICA) 50 MG capsule, Take 50 mg by mouth at bedtime., Disp: , Rfl:  .  Spacer/Aero-Holding Chambers (EASIVENT) inhaler, See admin instructions., Disp: , Rfl:  .  testosterone cypionate (DEPOTESTOTERONE CYPIONATE) 200 MG/ML injection, INJECT 0.25 MLS INTO THE MUSCLE EVERY 14 DAYS (Patient not taking: Reported on 04/06/2021), Disp: 10 mL, Rfl: 3 .  TRIUMEQ 600-50-300 MG tablet, Take 1 tablet by mouth daily., Disp: , Rfl:   Past Medical History: Past Medical History:  Diagnosis Date  . Anxiety   . Chronic nausea   . Degenerative disk disease   . HIV  (human immunodeficiency virus infection) ([place])   . Insomnia   . Muscle spasm   . Rotator cuff tear     Tobacco Use: Social History   Tobacco Use  Smoking Status Current Every Day Smoker  . Packs/day: 1.00  . Years: 39.00  . Pack years: 39.00  . Types: Cigarettes  Smokeless Tobacco Never Used  Tobacco Comment   Wears patches. Smokes about 6 cigarettes a week.    Dinero is a current tobacco user. Intervention for tobacco cessation was provided at the initial medical review. He was asked about readiness to quit and reported he is ready to quit. He has no quit date set and is currently using nicotine patches. He is currently down to 1 cigarette/ day. Patient was advised and educated about tobacco cessation using combination therapy, tobacco cessation classes, quit line, and quit smoking apps. Patient demonstrated understanding of this material. Staff will continue to provide encouragement and follow up with the patient throughout the program.      Labs: Recent Review Flowsheet Data    Labs for ITP Cardiac and Pulmonary Rehab Latest Ref Rng & Units 09/27/2011 01/17/2012 05/27/2012 08/01/2012 01/01/2013   Cholestrol 0 - 200 mg/dL 152 191 178 169 141   LDLCALC 0 - 99 mg/dL 77 107(H) 99 91 75   HDL >39 mg/dL 46 51 57 61 43   Trlycerides <150 mg/dL 145 166(H) 111 83 113       Pulmonary Assessment Scores:  Pulmonary Assessment Scores    Row Name 04/18/21 1302         ADL UCSD   ADL Phase Entry     SOB Score total 36     Rest 0     Walk 1     Stairs 2     Bath 1     Dress 2     Shop 3           CAT Score   CAT Score 20           mMRC Score   mMRC Score 1            UCSD: Self-administered rating of dyspnea associated with activities of daily living (ADLs) 6-point scale (0 = "not at all" to 5 = "maximal or unable to do because of breathlessness")  Scoring Scores range from 0 to 120.  Minimally important difference is 5 units  CAT: CAT can identify the health  impairment of COPD patients and is better correlated with disease progression.  CAT has a scoring range of zero to 40. The CAT score is classified into four groups of low (less than 10), medium (10 - 20), high (21-30) and very high (31-40) based on the impact level of disease on health status. A CAT score over 10 suggests significant symptoms.  A worsening CAT score could be explained by an exacerbation,  poor medication adherence, poor inhaler technique, or progression of COPD or comorbid conditions.  CAT MCID is 2 points  mMRC: mMRC (Modified Medical Research Council) Dyspnea Scale is used to assess the degree of baseline functional disability in patients of respiratory disease due to dyspnea. No minimal important difference is established. A decrease in score of 1 point or greater is considered a positive change.   Pulmonary Function Assessment:  Pulmonary Function Assessment - 04/06/21 1044      Breath   Shortness of Breath Yes;Limiting activity           Exercise Target Goals: Exercise Program Goal: Individual exercise prescription set using results from initial 6 min walk test and THRR while considering  patient's activity barriers and safety.   Exercise Prescription Goal: Initial exercise prescription builds to 30-45 minutes a day of aerobic activity, 2-3 days per week.  Home exercise guidelines will be given to patient during program as part of exercise prescription that the participant will acknowledge.  Education: Aerobic Exercise: - Group verbal and visual presentation on the components of exercise prescription. Introduces F.I.T.T principle from ACSM for exercise prescriptions.  Reviews F.I.T.T. principles of aerobic exercise including progression. Written material given at graduation.   Education: Resistance Exercise: - Group verbal and visual presentation on the components of exercise prescription. Introduces F.I.T.T principle from ACSM for exercise prescriptions  Reviews  F.I.T.T. principles of resistance exercise including progression. Written material given at graduation.    Education: Exercise & Equipment Safety: - Individual verbal instruction and demonstration of equipment use and safety with use of the equipment. Flowsheet Row Pulmonary Rehab from 04/06/2021 in [facility] Cardiac and Pulmonary Rehab  Date 04/06/21  Educator [facility]  Instruction Review Code 1- Verbalizes Understanding      Education: Exercise Physiology & General Exercise Guidelines: - Group verbal and written instruction with models to review the exercise physiology of the cardiovascular system and associated critical values. Provides general exercise guidelines with specific guidelines to those with heart or lung disease.    Education: Flexibility, Balance, Mind/Body Relaxation: - Group verbal and visual presentation with interactive activity on the components of exercise prescription. Introduces F.I.T.T principle from ACSM for exercise prescriptions. Reviews F.I.T.T. principles of flexibility and balance exercise training including progression. Also discusses the mind body connection.  Reviews various relaxation techniques to help reduce and manage stress (i.e. Deep breathing, progressive muscle relaxation, and visualization). Balance handout provided to take home. Written material given at graduation.   Activity Barriers & Risk Stratification:   6 Minute Walk:  6 Minute Walk    Row Name 04/18/21 1236         6 Minute Walk   Phase Initial     Distance 905 feet     Walk Time 6 minutes     # of Rest Breaks 0     MPH 1.71     METS 2.65     RPE 9     Perceived Dyspnea  1     VO2 Peak 9.3     Symptoms Yes (comment)     Comments Slight SOB     Resting HR 76 bpm     Resting BP 104/62     Resting Oxygen Saturation  95 %     Exercise Oxygen Saturation  during 6 min walk 87 %     Max Ex. HR 97 bpm     Max Ex. BP 128/64     2 Minute Post BP 110/64  Interval HR   1 Minute  HR 89     2 Minute HR 93     3 Minute HR 93     4 Minute HR 96     5 Minute HR 97     6 Minute HR 96     2 Minute Post HR 78     Interval Heart Rate? Yes           Interval Oxygen   Interval Oxygen? Yes     Baseline Oxygen Saturation % 95 %     1 Minute Oxygen Saturation % 93 %     1 Minute Liters of Oxygen 0 L  RA     2 Minute Oxygen Saturation % 92 %     2 Minute Liters of Oxygen 0 L     3 Minute Oxygen Saturation % 89 %     3 Minute Liters of Oxygen 0 L     4 Minute Oxygen Saturation % 88 %     4 Minute Liters of Oxygen 0 L     5 Minute Oxygen Saturation % 88 %     5 Minute Liters of Oxygen 0 L     6 Minute Oxygen Saturation % 87 %     6 Minute Liters of Oxygen 0 L     2 Minute Post Oxygen Saturation % 91 %     2 Minute Post Liters of Oxygen 0 L           Oxygen Initial Assessment:  Oxygen Initial Assessment - 04/18/21 1302      Home Oxygen   Home Oxygen Device None    Sleep Oxygen Prescription None    Home Exercise Oxygen Prescription None    Home Resting Oxygen Prescription None    Compliance with Home Oxygen Use Yes      Initial 6 min Walk   Oxygen Used None      Program Oxygen Prescription   Program Oxygen Prescription None      Intervention   Short Term Goals To learn and understand importance of monitoring SPO2 with pulse oximeter and demonstrate accurate use of the pulse oximeter.;To learn and understand importance of maintaining oxygen saturations>88%;To learn and demonstrate proper pursed lip breathing techniques or other breathing techniques.;To learn and demonstrate proper use of respiratory medications    Long  Term Goals Verbalizes importance of monitoring SPO2 with pulse oximeter and return demonstration;Maintenance of O2 saturations>88%;Exhibits proper breathing techniques, such as pursed lip breathing or other method taught during program session;Compliance with respiratory medication;Demonstrates proper use of MDI's           Oxygen  Re-Evaluation:   Oxygen Discharge (Final Oxygen Re-Evaluation):   Initial Exercise Prescription:  Initial Exercise Prescription - 04/18/21 1200      Date of Initial Exercise RX and Referring Provider   Date 04/18/21    Referring Provider Ottie Glazier MD      Recumbant Bike   Level 2    RPM 60    Watts 15    Minutes 15    METs 2.65      NuStep   Level 2    SPM 80    Minutes 15    METs 2.65      Arm Ergometer   Level --    RPM --    Minutes --    METs --      T5 Nustep   Level 1    SPM 80  Minutes 15    METs 2.65      Track   Laps 25    Minutes 15    METs 2.4      Prescription Details   Frequency (times per week) 3    Duration Progress to 30 minutes of continuous aerobic without signs/symptoms of physical distress      Intensity   THRR 40-80% of Max Heartrate 107-138    Ratings of Perceived Exertion 11-13    Perceived Dyspnea 0-4      Progression   Progression Continue to progress workloads to maintain intensity without signs/symptoms of physical distress.      Resistance Training   Training Prescription Yes    Weight 3 lb    Reps 10-15           Perform Capillary Blood Glucose checks as needed.  Exercise Prescription Changes:  Exercise Prescription Changes    Row Name 04/18/21 1200             Response to Exercise   Blood Pressure (Admit) 104/63       Blood Pressure (Exercise) 128/64       Blood Pressure (Exit) 110/64       Heart Rate (Admit) 76 bpm       Heart Rate (Exercise) 97 bpm       Heart Rate (Exit) 81 bpm       Oxygen Saturation (Admit) 95 %       Oxygen Saturation (Exercise) 87 %       Oxygen Saturation (Exit) 94 %       Rating of Perceived Exertion (Exercise) 9       Perceived Dyspnea (Exercise) 1       Symptoms Slight SOB       Comments walk test results              Exercise Comments:   Exercise Goals and Review:  Exercise Goals    Row Name 04/18/21 1309             Exercise Goals   Increase  Physical Activity Yes       Intervention Provide advice, education, support and counseling about physical activity/exercise needs.;Develop an individualized exercise prescription for aerobic and resistive training based on initial evaluation findings, risk stratification, comorbidities and participant's personal goals.       Expected Outcomes Short Term: Attend rehab on a regular basis to increase amount of physical activity.;Long Term: Add in home exercise to make exercise part of routine and to increase amount of physical activity.;Long Term: Exercising regularly at least 3-5 days a week.       Increase Strength and Stamina Yes       Intervention Provide advice, education, support and counseling about physical activity/exercise needs.;Develop an individualized exercise prescription for aerobic and resistive training based on initial evaluation findings, risk stratification, comorbidities and participant's personal goals.       Expected Outcomes Short Term: Increase workloads from initial exercise prescription for resistance, speed, and METs.;Short Term: Perform resistance training exercises routinely during rehab and add in resistance training at home;Long Term: Improve cardiorespiratory fitness, muscular endurance and strength as measured by increased METs and functional capacity (6MWT)       Able to understand and use rate of perceived exertion (RPE) scale Yes       Intervention Provide education and explanation on how to use RPE scale       Expected Outcomes Short Term: Able to use RPE daily  in rehab to express subjective intensity level;Long Term:  Able to use RPE to guide intensity level when exercising independently       Able to understand and use Dyspnea scale Yes       Intervention Provide education and explanation on how to use Dyspnea scale       Expected Outcomes Short Term: Able to use Dyspnea scale daily in rehab to express subjective sense of shortness of breath during exertion;Long Term:  Able to use Dyspnea scale to guide intensity level when exercising independently       Knowledge and understanding of Target Heart Rate Range (THRR) Yes       Intervention Provide education and explanation of THRR including how the numbers were predicted and where they are located for reference       Expected Outcomes Short Term: Able to state/look up THRR;Short Term: Able to use daily as guideline for intensity in rehab;Long Term: Able to use THRR to govern intensity when exercising independently       Able to check pulse independently Yes       Intervention Provide education and demonstration on how to check pulse in carotid and radial arteries.;Review the importance of being able to check your own pulse for safety during independent exercise       Expected Outcomes Short Term: Able to explain why pulse checking is important during independent exercise;Long Term: Able to check pulse independently and accurately       Understanding of Exercise Prescription Yes       Intervention Provide education, explanation, and written materials on patient's individual exercise prescription       Expected Outcomes Short Term: Able to explain program exercise prescription;Long Term: Able to explain home exercise prescription to exercise independently              Exercise Goals Re-Evaluation :   Discharge Exercise Prescription (Final Exercise Prescription Changes):  Exercise Prescription Changes - 04/18/21 1200      Response to Exercise   Blood Pressure (Admit) 104/63    Blood Pressure (Exercise) 128/64    Blood Pressure (Exit) 110/64    Heart Rate (Admit) 76 bpm    Heart Rate (Exercise) 97 bpm    Heart Rate (Exit) 81 bpm    Oxygen Saturation (Admit) 95 %    Oxygen Saturation (Exercise) 87 %    Oxygen Saturation (Exit) 94 %    Rating of Perceived Exertion (Exercise) 9    Perceived Dyspnea (Exercise) 1    Symptoms Slight SOB    Comments walk test results           Nutrition:  Target Goals:  Understanding of nutrition guidelines, daily intake of sodium 1500mg , cholesterol 200mg , calories 30% from fat and 7% or less from saturated fats, daily to have 5 or more servings of fruits and vegetables.  Education: All About Nutrition: -Group instruction provided by verbal, written material, interactive activities, discussions, models, and posters to present general guidelines for heart healthy nutrition including fat, fiber, MyPlate, the role of sodium in heart healthy nutrition, utilization of the nutrition label, and utilization of this knowledge for meal planning. Follow up email sent as well. Written material given at graduation.   Biometrics:  Pre Biometrics - 04/18/21 1223      Pre Biometrics   Height 5' 10.2" (1.783 m)    Weight 167 lb 9.6 oz (76 kg)    BMI (Calculated) 23.91    Single Leg Stand 30 seconds  Nutrition Therapy Plan and Nutrition Goals:   Nutrition Assessments:  MEDIFICTS Score Key:  ?70 Need to make dietary changes   40-70 Heart Healthy Diet  ? 40 Therapeutic Level Cholesterol Diet  Flowsheet Row Pulmonary Rehab from 04/18/2021 in [facility] Cardiac and Pulmonary Rehab  Picture Your Plate Total Score on Admission 52     Picture Your Plate Scores:  [id number] Unhealthy dietary pattern with much room for improvement.  41-50 Dietary pattern unlikely to meet recommendations for good health and room for improvement.  51-60 More healthful dietary pattern, with some room for improvement.   >60 Healthy dietary pattern, although there may be some specific behaviors that could be improved.   Nutrition Goals Re-Evaluation:   Nutrition Goals Discharge (Final Nutrition Goals Re-Evaluation):   Psychosocial: Target Goals: Acknowledge presence or absence of significant depression and/or stress, maximize coping skills, provide positive support system. Participant is able to verbalize types and ability to use techniques and skills needed for reducing stress  and depression.   Education: Stress, Anxiety, and Depression - Group verbal and visual presentation to define topics covered.  Reviews how body is impacted by stress, anxiety, and depression.  Also discusses healthy ways to reduce stress and to treat/manage anxiety and depression.  Written material given at graduation.   Education: Sleep Hygiene -Provides group verbal and written instruction about how sleep can affect your health.  Define sleep hygiene, discuss sleep cycles and impact of sleep habits. Review good sleep hygiene tips.    Initial Review & Psychosocial Screening:  Initial Psych Review & Screening - 04/06/21 1047      Initial Review   Current issues with Current Psychotropic Meds;Current Sleep Concerns;History of Depression      Family Dynamics   Good Support System? No    Strains Intra-family strains    Comments He takes medication to help him sleep at night. He can look to his one sister for support. He lives by himself and is ok with living by himself.      Barriers   Psychosocial barriers to participate in program The patient should benefit from training in stress management and relaxation.      Screening Interventions   Interventions Encouraged to exercise;To provide support and resources with identified psychosocial needs;Provide feedback about the scores to participant    Expected Outcomes Short Term goal: Utilizing psychosocial counselor, staff and physician to assist with identification of specific Stressors or current issues interfering with healing process. Setting desired goal for each stressor or current issue identified.;Long Term Goal: Stressors or current issues are controlled or eliminated.;Short Term goal: Identification and review with participant of any Quality of Life or Depression concerns found by scoring the questionnaire.;Long Term goal: The participant improves quality of Life and PHQ9 Scores as seen by post scores and/or verbalization of changes            Quality of Life Scores:  Scores of 19 and below usually indicate a poorer quality of life in these areas.  A difference of  2-3 points is a clinically meaningful difference.  A difference of 2-3 points in the total score of the Quality of Life Index has been associated with significant improvement in overall quality of life, self-image, physical symptoms, and general health in studies assessing change in quality of life.  PHQ-9: Recent Review Flowsheet Data    Depression screen [facility] 2/9 04/18/2021 05/07/2013 01/30/2013   Decreased Interest 1 0 0   Down, Depressed, Hopeless 0 0 0  PHQ - 2 Score 1 0 0   Altered sleeping 0 - -   Tired, decreased energy 1 - -   Change in appetite 1 - -   Feeling bad or failure about yourself  0 - -   Trouble concentrating 1 - -   Moving slowly or fidgety/restless 0 - -   Suicidal thoughts 0 - -   PHQ-9 Score 4 - -   Difficult doing work/chores Somewhat difficult - -     Interpretation of Total Score  Total Score Depression Severity:  1-4 = Minimal depression, 5-9 = Mild depression, 10-14 = Moderate depression, 15-19 = Moderately severe depression, 20-27 = Severe depression   Psychosocial Evaluation and Intervention:  Psychosocial Evaluation - 04/06/21 1053      Psychosocial Evaluation & Interventions   Interventions Encouraged to exercise with the program and follow exercise prescription;Relaxation education;Stress management education    Comments He takes medication to help him sleep at night. He can look to his one sister for support. He lives by himself and is ok with living by himself.    Expected Outcomes Short: Exercise regularly to support mental health and notify staff of any changes. Long: maintain mental health and well being through teaching of rehab or prescribed medications independently.    Continue Psychosocial Services  Follow up required by staff           Psychosocial Re-Evaluation:   Psychosocial Discharge (Final  Psychosocial Re-Evaluation):   Education: Education Goals: Education classes will be provided on a weekly basis, covering required topics. Participant will state understanding/return demonstration of topics presented.  Learning Barriers/Preferences:  Learning Barriers/Preferences - 04/06/21 1045      Learning Barriers/Preferences   Learning Barriers None    Learning Preferences None           General Pulmonary Education Topics:  Infection Prevention: - Provides verbal and written material to individual with discussion of infection control including proper hand washing and proper equipment cleaning during exercise session. Flowsheet Row Pulmonary Rehab from 04/06/2021 in [facility] Cardiac and Pulmonary Rehab  Date 04/06/21  Educator [facility]  Instruction Review Code 1- Verbalizes Understanding      Falls Prevention: - Provides verbal and written material to individual with discussion of falls prevention and safety. Flowsheet Row Pulmonary Rehab from 04/06/2021 in [facility] Cardiac and Pulmonary Rehab  Date 04/06/21  Educator [facility]  Instruction Review Code 1- Verbalizes Understanding      Chronic Lung Disease Review: - Group verbal instruction with posters, models, PowerPoint presentations and videos,  to review new updates, new respiratory medications, new advancements in procedures and treatments. Providing information on websites and "800" numbers for continued self-education. Includes information about supplement oxygen, available portable oxygen systems, continuous and intermittent flow rates, oxygen safety, concentrators, and Medicare reimbursement for oxygen. Explanation of Pulmonary Drugs, including class, frequency, complications, importance of spacers, rinsing mouth after steroid MDI's, and proper cleaning methods for nebulizers. Review of basic lung anatomy and physiology related to function, structure, and complications of lung disease. Review of risk factors. Discussion about methods for  diagnosing sleep apnea and types of masks and machines for OSA. Includes a review of the use of types of environmental controls: home humidity, furnaces, filters, dust mite/pet prevention, HEPA vacuums. Discussion about weather changes, air quality and the benefits of nasal washing. Instruction on Warning signs, infection symptoms, calling MD promptly, preventive modes, and value of vaccinations. Review of effective airway clearance, coughing and/or vibration techniques. Emphasizing that all should  Create an Sports administrator. Written material given at graduation.   AED/CPR: - Group verbal and written instruction with the use of models to demonstrate the basic use of the AED with the basic ABC's of resuscitation.    Anatomy and Cardiac Procedures: - Group verbal and visual presentation and models provide information about basic cardiac anatomy and function. Reviews the testing methods done to diagnose heart disease and the outcomes of the test results. Describes the treatment choices: Medical Management, Angioplasty, or Coronary Bypass Surgery for treating various heart conditions including Myocardial Infarction, Angina, Valve Disease, and Cardiac Arrhythmias.  Written material given at graduation.   Medication Safety: - Group verbal and visual instruction to review commonly prescribed medications for heart and lung disease. Reviews the medication, class of the drug, and side effects. Includes the steps to properly store meds and maintain the prescription regimen.  Written material given at graduation.   Other: -Provides group and verbal instruction on various topics (see comments)   Knowledge Questionnaire Score:  Knowledge Questionnaire Score - 04/18/21 1306      Knowledge Questionnaire Score   Pre Score 13/18: Oxygen sats, ADL, Oxygen prescription            Core Components/Risk Factors/Patient Goals at Admission:  Personal Goals and Risk Factors at Admission - 04/18/21 1310      Core  Components/Risk Factors/Patient Goals on Admission    Weight Management Yes;Weight Maintenance    Intervention Weight Management: Develop a combined nutrition and exercise program designed to reach desired caloric intake, while maintaining appropriate intake of nutrient and fiber, sodium and fats, and appropriate energy expenditure required for the weight goal.;Weight Management: Provide education and appropriate resources to help participant work on and attain dietary goals.;Weight Management/Obesity: Establish reasonable short term and long term weight goals.    Admit Weight 167 lb (75.8 kg)    Goal Weight: Short Term 167 lb (75.8 kg)    Goal Weight: Long Term 167 lb (75.8 kg)    Expected Outcomes Short Term: Continue to assess and modify interventions until short term weight is achieved;Long Term: Adherence to nutrition and physical activity/exercise program aimed toward attainment of established weight goal;Weight Maintenance: Understanding of the daily nutrition guidelines, which includes 25-35% calories from fat, 7% or less cal from saturated fats, less than 200mg  cholesterol, less than 1.5gm of sodium, & 5 or more servings of fruits and vegetables daily;Understanding recommendations for meals to include 15-35% energy as protein, 25-35% energy from fat, 35-60% energy from carbohydrates, less than 200mg  of dietary cholesterol, 20-35 gm of total fiber daily;Understanding of distribution of calorie intake throughout the day with the consumption of 4-5 meals/snacks    Tobacco Cessation Yes    Number of packs per day .25    Intervention Assist the participant in steps to quit. Provide individualized education and counseling about committing to Tobacco Cessation, relapse prevention, and pharmacological support that can be provided by physician.;Advice worker, assist with locating and accessing local/national Quit Smoking programs, and support quit date choice.    Expected Outcomes Short  Term: Will demonstrate readiness to quit, by selecting a quit date.;Short Term: Will quit all tobacco product use, adhering to prevention of relapse plan.;Long Term: Complete abstinence from all tobacco products for at least 12 months from quit date.    Improve shortness of breath with ADL's Yes    Intervention Provide education, individualized exercise plan and daily activity instruction to help decrease symptoms of SOB with activities of daily living.  Expected Outcomes Short Term: Improve cardiorespiratory fitness to achieve a reduction of symptoms when performing ADLs;Long Term: Be able to perform more ADLs without symptoms or delay the onset of symptoms    Lipids Yes    Intervention Provide education and support for participant on nutrition & aerobic/resistive exercise along with prescribed medications to achieve LDL 70mg , HDL >40mg .    Expected Outcomes Short Term: Participant states understanding of desired cholesterol values and is compliant with medications prescribed. Participant is following exercise prescription and nutrition guidelines.;Long Term: Cholesterol controlled with medications as prescribed, with individualized exercise RX and with personalized nutrition plan. Value goals: LDL < 70mg , HDL > 40 mg.           Education:Diabetes - Individual verbal and written instruction to review signs/symptoms of diabetes, desired ranges of glucose level fasting, after meals and with exercise. Acknowledge that pre and post exercise glucose checks will be done for 3 sessions at entry of program.   Know Your Numbers and Heart Failure: - Group verbal and visual instruction to discuss disease risk factors for cardiac and pulmonary disease and treatment options.  Reviews associated critical values for Overweight/Obesity, Hypertension, Cholesterol, and Diabetes.  Discusses basics of heart failure: signs/symptoms and treatments.  Introduces Heart Failure Zone chart for action plan for heart  failure.  Written material given at graduation.   Core Components/Risk Factors/Patient Goals Review:    Core Components/Risk Factors/Patient Goals at Discharge (Final Review):    ITP Comments:  ITP Comments    Row Name 04/06/21 1045 04/18/21 1218         ITP Comments Virtual Visit completed. Patient informed on EP and RD appointment and 6 Minute walk test. Patient also informed of patient health questionnaires on My Chart. Patient Verbalizes understanding. Visit diagnosis can be found in [facility] 03/28/2021. Completed 6MWT and gym orientation. Initial ITP created and sent for review to Dr. Emily Filbert, Medical Director.             Comments: Initial ITP

## 2021-04-18 NOTE — Telephone Encounter (Signed)
As instructed by patient I left VM that he has appt on 05/24/21 with Dr Delaine Lame at 10:15 am.

## 2021-04-18 NOTE — Progress Notes (Signed)
.  h 

## 2021-04-25 ENCOUNTER — Encounter: Payer: Medicare Other | Attending: Pulmonary Disease

## 2021-04-25 ENCOUNTER — Other Ambulatory Visit: Payer: Self-pay

## 2021-04-25 DIAGNOSIS — J449 Chronic obstructive pulmonary disease, unspecified: Secondary | ICD-10-CM | POA: Insufficient documentation

## 2021-04-25 NOTE — Progress Notes (Signed)
Daily Session Note  Patient Details  Name: Albert Wong MRN: [id number] Date of Birth: 15-May-1956 Referring Provider:   Flowsheet Row Pulmonary Rehab from 04/18/2021 in [facility] Cardiac and Pulmonary Rehab  Referring Provider Ottie Glazier MD      Encounter Date: 04/25/2021  Check In:  Session Check In - 04/25/21 1131      Check-In   Supervising physician immediately available to respond to emergencies See telemetry face sheet for immediately available ER MD    Location [facility]-Cardiac & Pulmonary Rehab    Staff Present Nada Maclachlan, BA, ACSM CEP, Exercise Physiologist;Mackayla Mullins RN, Moises Blood, BS, ACSM CEP, Exercise Physiologist;Joseph Karie Fetch, MPA, RN    Virtual Visit No    Medication changes reported     No    Fall or balance concerns reported    No    Tobacco Cessation No Change    Warm-up and Cool-down Performed on first and last piece of equipment    Resistance Training Performed Yes    VAD Patient? No    PAD/SET Patient? No      Pain Assessment   Currently in Pain? No/denies              Social History   Tobacco Use  Smoking Status Current Every Day Smoker  . Packs/day: 1.00  . Years: 39.00  . Pack years: 39.00  . Types: Cigarettes  Smokeless Tobacco Never Used  Tobacco Comment   Wears patches. Smokes about 6 cigarettes a week.    Goals Met:  Proper associated with RPD/PD & O2 Sat Independence with exercise equipment Exercise tolerated well No report of cardiac concerns or symptoms Strength training completed today  Goals Unmet:  Not Applicable  Comments: First full day of exercise!  Patient was oriented to gym and equipment including functions, settings, policies, and procedures.  Patient's individual exercise prescription and treatment plan were reviewed.  All starting workloads were established based on the results of the 6 minute walk test done at initial orientation visit.  The plan for exercise progression was  also introduced and progression will be customized based on patient's performance and goals.    Dr. Emily Filbert is Medical Director for [place] and [facility].

## 2021-04-26 ENCOUNTER — Ambulatory Visit: Payer: Medicare Other | Admitting: Infectious Diseases

## 2021-04-27 ENCOUNTER — Other Ambulatory Visit: Payer: Self-pay

## 2021-04-27 DIAGNOSIS — J449 Chronic obstructive pulmonary disease, unspecified: Secondary | ICD-10-CM

## 2021-04-27 NOTE — Progress Notes (Signed)
Daily Session Note  Patient Details  Name: Albert Wong MRN: [id number] Date of Birth: 1956-01-10 Referring Provider:   Flowsheet Row Pulmonary Rehab from 04/18/2021 in [facility] Cardiac and Pulmonary Rehab  Referring Provider Ottie Glazier MD      Encounter Date: 04/27/2021  Check In:  Session Check In - 04/27/21 0952      Check-In   Supervising physician immediately available to respond to emergencies See telemetry face sheet for immediately available ER MD    Location [facility]-Cardiac & Pulmonary Rehab    Staff Present Birdie Sons, MPA, RN;Joseph Darrin Nipper, [place], RCEP, CCRP, CCET    Virtual Visit No    Medication changes reported     No    Fall or balance concerns reported    No    Tobacco Cessation No Change    Warm-up and Cool-down Performed on first and last piece of equipment    Resistance Training Performed Yes    VAD Patient? No    PAD/SET Patient? No      Pain Assessment   Currently in Pain? No/denies              Social History   Tobacco Use  Smoking Status Current Every Day Smoker  . Packs/day: 1.00  . Years: 39.00  . Pack years: 39.00  . Types: Cigarettes  Smokeless Tobacco Never Used  Tobacco Comment   Wears patches. Smokes about 6 cigarettes a week.    Goals Met:  Independence with exercise equipment Exercise tolerated well No report of cardiac concerns or symptoms Strength training completed today  Goals Unmet:  Not Applicable  Comments: Pt able to follow exercise prescription today without complaint.  Will continue to monitor for progression.    Dr. Emily Filbert is Medical Director for [place] and [facility].

## 2021-04-29 ENCOUNTER — Encounter: Payer: Medicare Other | Admitting: *Deleted

## 2021-04-29 ENCOUNTER — Other Ambulatory Visit: Payer: Self-pay

## 2021-04-29 DIAGNOSIS — J449 Chronic obstructive pulmonary disease, unspecified: Secondary | ICD-10-CM | POA: Diagnosis not present

## 2021-04-29 NOTE — Progress Notes (Signed)
Daily Session Note  Patient Details  Name: Albert Wong MRN: [id number] Date of Birth: [birth year] Referring Provider:   Flowsheet Row Pulmonary Rehab from 04/18/2021 in [facility] Cardiac and Pulmonary Rehab  Referring Provider Ottie Glazier MD      Encounter Date: 04/29/2021  Check In:  Session Check In - 04/29/21 1129      Check-In   Supervising physician immediately available to respond to emergencies See telemetry face sheet for immediately available ER MD    Location [facility]-Cardiac & Pulmonary Rehab    Staff Present Heath Lark, RN, BSN, CCRP;Jessica Perth, MA, RCEP, CCRP, CCET;Joseph Throop RCP,RRT,BSRT    Virtual Visit No    Medication changes reported     No    Fall or balance concerns reported    No    Warm-up and Cool-down Performed on first and last piece of equipment    Resistance Training Performed Yes    VAD Patient? No    PAD/SET Patient? No      Pain Assessment   Currently in Pain? No/denies              Social History   Tobacco Use  Smoking Status Current Every Day Smoker  . Packs/day: 1.00  . Years: 39.00  . Pack years: 39.00  . Types: Cigarettes  Smokeless Tobacco Never Used  Tobacco Comment   Wears patches. Smokes about 6 cigarettes a week.    Goals Met:  Proper associated with RPD/PD & O2 Sat Independence with exercise equipment Exercise tolerated well  Goals Unmet:  Not Applicable  Comments: Pt able to follow exercise prescription today without complaint.  Will continue to monitor for progression.    Dr. Emily Filbert is Medical Director for [place] and [facility].

## 2021-05-02 ENCOUNTER — Other Ambulatory Visit: Payer: Self-pay

## 2021-05-02 ENCOUNTER — Encounter: Payer: Medicare Other | Admitting: *Deleted

## 2021-05-02 DIAGNOSIS — J449 Chronic obstructive pulmonary disease, unspecified: Secondary | ICD-10-CM | POA: Diagnosis not present

## 2021-05-02 NOTE — Progress Notes (Signed)
Daily Session Note  Patient Details  Name: Albert Wong MRN: [id number] Date of Birth: [birth year] Referring Provider:   Flowsheet Row Pulmonary Rehab from 04/18/2021 in [facility] Cardiac and Pulmonary Rehab  Referring Provider Ottie Glazier MD      Encounter Date: 05/02/2021  Check In:  Session Check In - 05/02/21 1059      Check-In   Supervising physician immediately available to respond to emergencies See telemetry face sheet for immediately available ER MD    Location [facility]-Cardiac & Pulmonary Rehab    Staff Present Renita Papa, RN BSN;Joseph [address] [place], [place], ACSM CEP, Exercise Physiologist;Kelly Rosalia Hammers, MPA, RN    Virtual Visit No    Medication changes reported     No    Fall or balance concerns reported    No    Tobacco Cessation Use Decreased    Current number of cigarettes/nicotine per day     0    Warm-up and Cool-down Performed on first and last piece of equipment    Resistance Training Performed Yes    VAD Patient? No    PAD/SET Patient? No      Pain Assessment   Currently in Pain? No/denies              Social History   Tobacco Use  Smoking Status Current Every Day Smoker  . Packs/day: 1.00  . Years: 39.00  . Pack years: 39.00  . Types: Cigarettes  Smokeless Tobacco Never Used  Tobacco Comment   Wears patches. Smokes about 6 cigarettes a week.    Goals Met:  Independence with exercise equipment Exercise tolerated well No report of cardiac concerns or symptoms Strength training completed today  Goals Unmet:  Not Applicable  Comments: Pt able to follow exercise prescription today without complaint.  Will continue to monitor for progression.    Dr. Emily Filbert is Medical Director for [place] and [facility].

## 2021-05-03 DIAGNOSIS — Z79891 Long term (current) use of opiate analgesic: Secondary | ICD-10-CM | POA: Diagnosis not present

## 2021-05-06 DIAGNOSIS — E785 Hyperlipidemia, unspecified: Secondary | ICD-10-CM | POA: Diagnosis not present

## 2021-05-06 DIAGNOSIS — E559 Vitamin D deficiency, unspecified: Secondary | ICD-10-CM | POA: Diagnosis not present

## 2021-05-09 DIAGNOSIS — M545 Low back pain, unspecified: Secondary | ICD-10-CM | POA: Diagnosis not present

## 2021-05-09 DIAGNOSIS — G894 Chronic pain syndrome: Secondary | ICD-10-CM | POA: Diagnosis not present

## 2021-05-09 DIAGNOSIS — G8929 Other chronic pain: Secondary | ICD-10-CM | POA: Diagnosis not present

## 2021-05-09 DIAGNOSIS — M542 Cervicalgia: Secondary | ICD-10-CM | POA: Diagnosis not present

## 2021-05-09 DIAGNOSIS — M150 Primary generalized (osteo)arthritis: Secondary | ICD-10-CM | POA: Diagnosis not present

## 2021-05-09 DIAGNOSIS — M546 Pain in thoracic spine: Secondary | ICD-10-CM | POA: Diagnosis not present

## 2021-05-11 ENCOUNTER — Encounter: Payer: Self-pay | Admitting: *Deleted

## 2021-05-11 DIAGNOSIS — J449 Chronic obstructive pulmonary disease, unspecified: Secondary | ICD-10-CM

## 2021-05-11 DIAGNOSIS — J301 Allergic rhinitis due to pollen: Secondary | ICD-10-CM | POA: Diagnosis not present

## 2021-05-11 DIAGNOSIS — R110 Nausea: Secondary | ICD-10-CM | POA: Diagnosis not present

## 2021-05-11 DIAGNOSIS — I781 Nevus, non-neoplastic: Secondary | ICD-10-CM | POA: Diagnosis not present

## 2021-05-11 DIAGNOSIS — G8929 Other chronic pain: Secondary | ICD-10-CM | POA: Diagnosis not present

## 2021-05-11 DIAGNOSIS — E559 Vitamin D deficiency, unspecified: Secondary | ICD-10-CM | POA: Diagnosis not present

## 2021-05-11 DIAGNOSIS — E785 Hyperlipidemia, unspecified: Secondary | ICD-10-CM | POA: Diagnosis not present

## 2021-05-11 NOTE — Progress Notes (Signed)
Pulmonary Individual Treatment Plan  Patient Details  Name: Albert Wong MRN: [id number] Date of Birth: 10-06-1956 Referring Provider:   Flowsheet Row Pulmonary Rehab from 04/18/2021 in [facility] Cardiac and Pulmonary Rehab  Referring Provider Ottie Glazier MD      Initial Encounter Date:  Flowsheet Row Pulmonary Rehab from 04/18/2021 in [facility] Cardiac and Pulmonary Rehab  Date 04/18/21      Visit Diagnosis: Chronic obstructive pulmonary disease, unspecified COPD type ([place])  Patient's Home Medications on Admission:  Current Outpatient Medications:  .  amLODipine (NORVASC) 5 MG tablet, TAKE 1 TABLET BY MOUTH ONCE A DAY (Patient not taking: Reported on 04/06/2021), Disp: 30 tablet, Rfl: 11 .  amLODipine (NORVASC) 5 MG tablet, Take by mouth., Disp: , Rfl:  .  atorvastatin (LIPITOR) 10 MG tablet, Lipitor 10MG  Oral Tablet QTY: 90 tablet Days: 90 Refills: 0  Written: 01/08/17 Patient Instructions: TAKE 1 TABLET AT BEDTIME, Disp: , Rfl:  .  B-D 3CC LUER-LOK SYR 21GX1" 21G X 1" 3 ML MISC, USE AS DIRECTED WITH TESTOSTERONE, Disp: 24 each, Rfl: 6 .  baclofen (LIORESAL) 10 MG tablet, TAKE 1 TABLET BY MOUTH EVERY DAY, Disp: 30 tablet, Rfl: 11 .  baclofen (LIORESAL) 10 MG tablet, Take by mouth. (Patient not taking: Reported on 04/06/2021), Disp: , Rfl:  .  Budeson-Glycopyrrol-Formoterol (BREZTRI AEROSPHERE) 160-9-4.8 MCG/ACT AERO, INHALE 2 INHALATIONS INTO THE LUNGS TWICE DAILY, Disp: , Rfl:  .  cromolyn (OPTICROM) 4 % ophthalmic solution, SMARTSIG:1-2 Drop(s) In Eye(s) 4-6 Times Daily, Disp: , Rfl:  .  diazepam (VALIUM) 10 MG tablet, TAKE 1 TABLET BY MOUTH EVERY 6 HOURS AS NEEDED (Patient not taking: Reported on 04/06/2021), Disp: 30 tablet, Rfl: 3 .  diazepam (VALIUM) 10 MG tablet, Take by mouth., Disp: , Rfl:  .  dicyclomine (BENTYL) 10 MG capsule, Take 1 capsule (10 mg total) by mouth 4 (four) times daily., Disp: 120 capsule, Rfl: 5 .  ENSURE (ENSURE), Take 237 mLs by mouth 2 (two) times daily between  meals., Disp: 237 mL, Rfl: 11 .  fluticasone (FLONASE) 50 MCG/ACT nasal spray, SQUIRT 2 SPRAYS EACH NOSTRIL ONCE A DAY, Disp: 16 g, Rfl: 6 .  Ipratropium-Albuterol (COMBIVENT) 20-100 MCG/ACT AERS respimat, Inhale into the lungs., Disp: , Rfl:  .  ipratropium-albuterol (DUONEB) 0.5-2.5 (3) MG/3ML SOLN, Inhale into the lungs., Disp: , Rfl:  .  lubiprostone (AMITIZA) 24 MCG capsule, Take 24 mcg by mouth 2 (two) times daily., Disp: , Rfl:  .  MARINOL 10 MG capsule, TAKE ONE CAPSULE BY MOUTH TWICE A DAY BEFORE MEALS (Patient not taking: Reported on 04/06/2021), Disp: 60 capsule, Rfl: 1 .  Melatonin 5 MG CAPS, Take 1 capsule by mouth at bedtime., Disp: , Rfl:  .  morphine (MS CONTIN) 30 MG 12 hr tablet, Take 1 tablet (30 mg total) by mouth 2 (two) times daily., Disp: 60 tablet, Rfl: 0 .  morphine (MSIR) 15 MG tablet, Take 1 tablet (15 mg total) by mouth 3 (three) times daily as needed. (Patient not taking: Reported on 04/06/2021), Disp: 90 tablet, Rfl: 0 .  morphine (MSIR) 30 MG tablet, Take by mouth. (Patient not taking: Reported on 04/06/2021), Disp: , Rfl:  .  Olopatadine HCl (PATADAY) 0.2 % SOLN, Place 1 drop into both eyes 2 (two) times daily. (Patient not taking: Reported on 04/06/2021), Disp: 2.5 mL, Rfl: 4 .  PATADAY 0.2 % SOLN, PLACE 1 DROP INTO BOTH EYES 2 TIMES A DAY (Patient not taking: Reported on 04/06/2021), Disp: 5 mL,  Rfl: 3 .  pregabalin (LYRICA) 50 MG capsule, Take 50 mg by mouth at bedtime., Disp: , Rfl:  .  Spacer/Aero-Holding Chambers (EASIVENT) inhaler, See admin instructions., Disp: , Rfl:  .  testosterone cypionate (DEPOTESTOTERONE CYPIONATE) 200 MG/ML injection, INJECT 0.25 MLS INTO THE MUSCLE EVERY 14 DAYS (Patient not taking: Reported on 04/06/2021), Disp: 10 mL, Rfl: 3 .  TRIUMEQ 600-50-300 MG tablet, Take 1 tablet by mouth daily., Disp: , Rfl:   Past Medical History: Past Medical History:  Diagnosis Date  . Anxiety   . Chronic nausea   . Degenerative disk disease   . HIV  (human immunodeficiency virus infection) ([place])   . Insomnia   . Muscle spasm   . Rotator cuff tear     Tobacco Use: Social History   Tobacco Use  Smoking Status Current Every Day Smoker  . Packs/day: 1.00  . Years: 39.00  . Pack years: 39.00  . Types: Cigarettes  Smokeless Tobacco Never Used  Tobacco Comment   Wears patches. Smokes about 6 cigarettes a week.    Labs: Recent Review Flowsheet Data    Labs for ITP Cardiac and Pulmonary Rehab Latest Ref Rng & Units 09/27/2011 01/17/2012 05/27/2012 08/01/2012 01/01/2013   Cholestrol 0 - 200 mg/dL 152 191 178 169 141   LDLCALC 0 - 99 mg/dL 77 107(H) 99 91 75   HDL >39 mg/dL 46 51 57 61 43   Trlycerides <150 mg/dL 145 166(H) 111 83 113       Pulmonary Assessment Scores:  Pulmonary Assessment Scores    Row Name 04/18/21 1302         ADL UCSD   ADL Phase Entry     SOB Score total 36     Rest 0     Walk 1     Stairs 2     Bath 1     Dress 2     Shop 3           CAT Score   CAT Score 20           mMRC Score   mMRC Score 1            UCSD: Self-administered rating of dyspnea associated with activities of daily living (ADLs) 6-point scale (0 = "not at all" to 5 = "maximal or unable to do because of breathlessness")  Scoring Scores range from 0 to 120.  Minimally important difference is 5 units  CAT: CAT can identify the health impairment of COPD patients and is better correlated with disease progression.  CAT has a scoring range of zero to 40. The CAT score is classified into four groups of low (less than 10), medium (10 - 20), high (21-30) and very high (31-40) based on the impact level of disease on health status. A CAT score over 10 suggests significant symptoms.  A worsening CAT score could be explained by an exacerbation, poor medication adherence, poor inhaler technique, or progression of COPD or comorbid conditions.  CAT MCID is 2 points  mMRC: mMRC (Modified Medical Research Council) Dyspnea Scale is used to  assess the degree of baseline functional disability in patients of respiratory disease due to dyspnea. No minimal important difference is established. A decrease in score of 1 point or greater is considered a positive change.   Pulmonary Function Assessment:  Pulmonary Function Assessment - 04/06/21 1044      Breath   Shortness of Breath Yes;Limiting activity  Exercise Target Goals: Exercise Program Goal: Individual exercise prescription set using results from initial 6 min walk test and THRR while considering  patient's activity barriers and safety.   Exercise Prescription Goal: Initial exercise prescription builds to 30-45 minutes a day of aerobic activity, 2-3 days per week.  Home exercise guidelines will be given to patient during program as part of exercise prescription that the participant will acknowledge.  Education: Aerobic Exercise: - Group verbal and visual presentation on the components of exercise prescription. Introduces F.I.T.T principle from ACSM for exercise prescriptions.  Reviews F.I.T.T. principles of aerobic exercise including progression. Written material given at graduation.   Education: Resistance Exercise: - Group verbal and visual presentation on the components of exercise prescription. Introduces F.I.T.T principle from ACSM for exercise prescriptions  Reviews F.I.T.T. principles of resistance exercise including progression. Written material given at graduation.    Education: Exercise & Equipment Safety: - Individual verbal instruction and demonstration of equipment use and safety with use of the equipment. Flowsheet Row Pulmonary Rehab from 04/27/2021 in [facility] Cardiac and Pulmonary Rehab  Date 04/06/21  Educator [facility]  Instruction Review Code 1- Verbalizes Understanding      Education: Exercise Physiology & General Exercise Guidelines: - Group verbal and written instruction with models to review the exercise physiology of the cardiovascular system  and associated critical values. Provides general exercise guidelines with specific guidelines to those with heart or lung disease.    Education: Flexibility, Balance, Mind/Body Relaxation: - Group verbal and visual presentation with interactive activity on the components of exercise prescription. Introduces F.I.T.T principle from ACSM for exercise prescriptions. Reviews F.I.T.T. principles of flexibility and balance exercise training including progression. Also discusses the mind body connection.  Reviews various relaxation techniques to help reduce and manage stress (i.e. Deep breathing, progressive muscle relaxation, and visualization). Balance handout provided to take home. Written material given at graduation. Flowsheet Row Pulmonary Rehab from 04/27/2021 in [facility] Cardiac and Pulmonary Rehab  Date 04/27/21  Educator [facility]  Instruction Review Code 1- Verbalizes Understanding      Activity Barriers & Risk Stratification:   6 Minute Walk:  6 Minute Walk    Row Name 04/18/21 1236         6 Minute Walk   Phase Initial     Distance 905 feet     Walk Time 6 minutes     # of Rest Breaks 0     MPH 1.71     METS 2.65     RPE 9     Perceived Dyspnea  1     VO2 Peak 9.3     Symptoms Yes (comment)     Comments Slight SOB     Resting HR 76 bpm     Resting BP 104/62     Resting Oxygen Saturation  95 %     Exercise Oxygen Saturation  during 6 min walk 87 %     Max Ex. HR 97 bpm     Max Ex. BP 128/64     2 Minute Post BP 110/64           Interval HR   1 Minute HR 89     2 Minute HR 93     3 Minute HR 93     4 Minute HR 96     5 Minute HR 97     6 Minute HR 96     2 Minute Post HR 78     Interval Heart Rate? Yes  Interval Oxygen   Interval Oxygen? Yes     Baseline Oxygen Saturation % 95 %     1 Minute Oxygen Saturation % 93 %     1 Minute Liters of Oxygen 0 L  RA     2 Minute Oxygen Saturation % 92 %     2 Minute Liters of Oxygen 0 L     3 Minute Oxygen Saturation %  89 %     3 Minute Liters of Oxygen 0 L     4 Minute Oxygen Saturation % 88 %     4 Minute Liters of Oxygen 0 L     5 Minute Oxygen Saturation % 88 %     5 Minute Liters of Oxygen 0 L     6 Minute Oxygen Saturation % 87 %     6 Minute Liters of Oxygen 0 L     2 Minute Post Oxygen Saturation % 91 %     2 Minute Post Liters of Oxygen 0 L           Oxygen Initial Assessment:  Oxygen Initial Assessment - 04/18/21 1302      Home Oxygen   Home Oxygen Device None    Sleep Oxygen Prescription None    Home Exercise Oxygen Prescription None    Home Resting Oxygen Prescription None    Compliance with Home Oxygen Use Yes      Initial 6 min Walk   Oxygen Used None      Program Oxygen Prescription   Program Oxygen Prescription None      Intervention   Short Term Goals To learn and understand importance of monitoring SPO2 with pulse oximeter and demonstrate accurate use of the pulse oximeter.;To learn and understand importance of maintaining oxygen saturations>88%;To learn and demonstrate proper pursed lip breathing techniques or other breathing techniques.;To learn and demonstrate proper use of respiratory medications    Long  Term Goals Verbalizes importance of monitoring SPO2 with pulse oximeter and return demonstration;Maintenance of O2 saturations>88%;Exhibits proper breathing techniques, such as pursed lip breathing or other method taught during program session;Compliance with respiratory medication;Demonstrates proper use of MDI's           Oxygen Re-Evaluation:  Oxygen Re-Evaluation    Row Name 04/25/21 1138             Program Oxygen Prescription   Program Oxygen Prescription None               Home Oxygen   Home Oxygen Device None       Sleep Oxygen Prescription None       Home Exercise Oxygen Prescription None       Home Resting Oxygen Prescription None               Goals/Expected Outcomes   Short Term Goals To learn and understand importance of monitoring SPO2  with pulse oximeter and demonstrate accurate use of the pulse oximeter.;To learn and understand importance of maintaining oxygen saturations>88%;To learn and demonstrate proper pursed lip breathing techniques or other breathing techniques.       Long  Term Goals Verbalizes importance of monitoring SPO2 with pulse oximeter and return demonstration;Maintenance of O2 saturations>88%;Exhibits proper breathing techniques, such as pursed lip breathing or other method taught during program session;Exhibits compliance with exercise, home and travel O2 prescription;Compliance with respiratory medication       Comments Reviewed PLB technique with pt.  Talked about how it works and it's importance in  maintaining their exercise saturations.       Goals/Expected Outcomes Short: Become more profiecient at using PLB.   Long: Become independent at using PLB.              Oxygen Discharge (Final Oxygen Re-Evaluation):  Oxygen Re-Evaluation - 04/25/21 1138      Program Oxygen Prescription   Program Oxygen Prescription None      Home Oxygen   Home Oxygen Device None    Sleep Oxygen Prescription None    Home Exercise Oxygen Prescription None    Home Resting Oxygen Prescription None      Goals/Expected Outcomes   Short Term Goals To learn and understand importance of monitoring SPO2 with pulse oximeter and demonstrate accurate use of the pulse oximeter.;To learn and understand importance of maintaining oxygen saturations>88%;To learn and demonstrate proper pursed lip breathing techniques or other breathing techniques.    Long  Term Goals Verbalizes importance of monitoring SPO2 with pulse oximeter and return demonstration;Maintenance of O2 saturations>88%;Exhibits proper breathing techniques, such as pursed lip breathing or other method taught during program session;Exhibits compliance with exercise, home and travel O2 prescription;Compliance with respiratory medication    Comments Reviewed PLB technique with  pt.  Talked about how it works and it's importance in maintaining their exercise saturations.    Goals/Expected Outcomes Short: Become more profiecient at using PLB.   Long: Become independent at using PLB.           Initial Exercise Prescription:  Initial Exercise Prescription - 04/18/21 1200      Date of Initial Exercise RX and Referring Provider   Date 04/18/21    Referring Provider Ottie Glazier MD      Recumbant Bike   Level 2    RPM 60    Watts 15    Minutes 15    METs 2.65      NuStep   Level 2    SPM 80    Minutes 15    METs 2.65      Arm Ergometer   Level --    RPM --    Minutes --    METs --      T5 Nustep   Level 1    SPM 80    Minutes 15    METs 2.65      Track   Laps 25    Minutes 15    METs 2.4      Prescription Details   Frequency (times per week) 3    Duration Progress to 30 minutes of continuous aerobic without signs/symptoms of physical distress      Intensity   THRR 40-80% of Max Heartrate 107-138    Ratings of Perceived Exertion 11-13    Perceived Dyspnea 0-4      Progression   Progression Continue to progress workloads to maintain intensity without signs/symptoms of physical distress.      Resistance Training   Training Prescription Yes    Weight 3 lb    Reps 10-15           Perform Capillary Blood Glucose checks as needed.  Exercise Prescription Changes:  Exercise Prescription Changes    Row Name 04/18/21 1200 04/27/21 1400           Response to Exercise   Blood Pressure (Admit) 104/63 122/60      Blood Pressure (Exercise) 128/64 184/80      Blood Pressure (Exit) 110/64 110/60      Heart Rate (Admit)  76 bpm 85 bpm      Heart Rate (Exercise) 97 bpm 99 bpm      Heart Rate (Exit) 81 bpm 80 bpm      Oxygen Saturation (Admit) 95 % 91 %      Oxygen Saturation (Exercise) 87 % 93 %      Oxygen Saturation (Exit) 94 % 93 %      Rating of Perceived Exertion (Exercise) 9 13      Perceived Dyspnea (Exercise) 1 1       Symptoms Slight SOB --      Comments walk test results second day of exercise      Duration -- Progress to 30 minutes of  aerobic without signs/symptoms of physical distress      Intensity -- THRR unchanged             Progression   Progression -- Continue to progress workloads to maintain intensity without signs/symptoms of physical distress.      Average METs -- 2.13             Resistance Training   Training Prescription -- Yes      Weight -- 3 lb      Reps -- 10-15             Interval Training   Interval Training -- No             Recumbant Bike   Level -- 2      Minutes -- 15      METs -- 3             NuStep   Level -- 2      Minutes -- 15      METs -- 1.8             Exercise Comments:  Exercise Comments    Row Name 04/25/21 1132           Exercise Comments First full day of exercise!  Patient was oriented to gym and equipment including functions, settings, policies, and procedures.  Patient's individual exercise prescription and treatment plan were reviewed.  All starting workloads were established based on the results of the 6 minute walk test done at initial orientation visit.  The plan for exercise progression was also introduced and progression will be customized based on patient's performance and goals.              Exercise Goals and Review:  Exercise Goals    Row Name 04/18/21 1309             Exercise Goals   Increase Physical Activity Yes       Intervention Provide advice, education, support and counseling about physical activity/exercise needs.;Develop an individualized exercise prescription for aerobic and resistive training based on initial evaluation findings, risk stratification, comorbidities and participant's personal goals.       Expected Outcomes Short Term: Attend rehab on a regular basis to increase amount of physical activity.;Long Term: Add in home exercise to make exercise part of routine and to increase amount of physical  activity.;Long Term: Exercising regularly at least 3-5 days a week.       Increase Strength and Stamina Yes       Intervention Provide advice, education, support and counseling about physical activity/exercise needs.;Develop an individualized exercise prescription for aerobic and resistive training based on initial evaluation findings, risk stratification, comorbidities and participant's personal goals.       Expected Outcomes Short  Term: Increase workloads from initial exercise prescription for resistance, speed, and METs.;Short Term: Perform resistance training exercises routinely during rehab and add in resistance training at home;Long Term: Improve cardiorespiratory fitness, muscular endurance and strength as measured by increased METs and functional capacity (6MWT)       Able to understand and use rate of perceived exertion (RPE) scale Yes       Intervention Provide education and explanation on how to use RPE scale       Expected Outcomes Short Term: Able to use RPE daily in rehab to express subjective intensity level;Long Term:  Able to use RPE to guide intensity level when exercising independently       Able to understand and use Dyspnea scale Yes       Intervention Provide education and explanation on how to use Dyspnea scale       Expected Outcomes Short Term: Able to use Dyspnea scale daily in rehab to express subjective sense of shortness of breath during exertion;Long Term: Able to use Dyspnea scale to guide intensity level when exercising independently       Knowledge and understanding of Target Heart Rate Range (THRR) Yes       Intervention Provide education and explanation of THRR including how the numbers were predicted and where they are located for reference       Expected Outcomes Short Term: Able to state/look up THRR;Short Term: Able to use daily as guideline for intensity in rehab;Long Term: Able to use THRR to govern intensity when exercising independently       Able to check  pulse independently Yes       Intervention Provide education and demonstration on how to check pulse in carotid and radial arteries.;Review the importance of being able to check your own pulse for safety during independent exercise       Expected Outcomes Short Term: Able to explain why pulse checking is important during independent exercise;Long Term: Able to check pulse independently and accurately       Understanding of Exercise Prescription Yes       Intervention Provide education, explanation, and written materials on patient's individual exercise prescription       Expected Outcomes Short Term: Able to explain program exercise prescription;Long Term: Able to explain home exercise prescription to exercise independently              Exercise Goals Re-Evaluation :  Exercise Goals Re-Evaluation    [place] Name 04/25/21 1137 04/27/21 1418           Exercise Goal Re-Evaluation   Exercise Goals Review Increase Physical Activity;Able to understand and use rate of perceived exertion (RPE) scale;Knowledge and understanding of Target Heart Rate Range (THRR);Understanding of Exercise Prescription;Increase Strength and Stamina;Able to understand and use Dyspnea scale;Able to check pulse independently Increase Physical Activity;Increase Strength and Stamina;Understanding of Exercise Prescription      Comments Reviewed RPE and dyspnea scales, THR and program prescription with pt today.  Pt voiced understanding and was given a copy of goals to take home. Louie Casa has completed his first two full days of exercise.  He did well these two days.  We will continue to montior his progress.      Expected Outcomes Short: Use RPE daily to regulate intensity. Long: Follow program prescription in THR. Short: Continue to attend rehab regularly Long: Continue to follow program prescription             Discharge Exercise Prescription (Final Exercise Prescription Changes):  Exercise Prescription Changes - 04/27/21 1400       Response to Exercise   Blood Pressure (Admit) 122/60    Blood Pressure (Exercise) 184/80    Blood Pressure (Exit) 110/60    Heart Rate (Admit) 85 bpm    Heart Rate (Exercise) 99 bpm    Heart Rate (Exit) 80 bpm    Oxygen Saturation (Admit) 91 %    Oxygen Saturation (Exercise) 93 %    Oxygen Saturation (Exit) 93 %    Rating of Perceived Exertion (Exercise) 13    Perceived Dyspnea (Exercise) 1    Comments second day of exercise    Duration Progress to 30 minutes of  aerobic without signs/symptoms of physical distress    Intensity THRR unchanged      Progression   Progression Continue to progress workloads to maintain intensity without signs/symptoms of physical distress.    Average METs 2.13      Resistance Training   Training Prescription Yes    Weight 3 lb    Reps 10-15      Interval Training   Interval Training No      Recumbant Bike   Level 2    Minutes 15    METs 3      NuStep   Level 2    Minutes 15    METs 1.8           Nutrition:  Target Goals: Understanding of nutrition guidelines, daily intake of sodium 1500mg , cholesterol 200mg , calories 30% from fat and 7% or less from saturated fats, daily to have 5 or more servings of fruits and vegetables.  Education: All About Nutrition: -Group instruction provided by verbal, written material, interactive activities, discussions, models, and posters to present general guidelines for heart healthy nutrition including fat, fiber, MyPlate, the role of sodium in heart healthy nutrition, utilization of the nutrition label, and utilization of this knowledge for meal planning. Follow up email sent as well. Written material given at graduation.   Biometrics:  Pre Biometrics - 04/18/21 1223      Pre Biometrics   Height 5' 10.2" (1.783 m)    Weight 167 lb 9.6 oz (76 kg)    BMI (Calculated) 23.91    Single Leg Stand 30 seconds            Nutrition Therapy Plan and Nutrition Goals:   Nutrition  Assessments:  MEDIFICTS Score Key:  ?70 Need to make dietary changes   40-70 Heart Healthy Diet  ? 40 Therapeutic Level Cholesterol Diet  Flowsheet Row Pulmonary Rehab from 04/18/2021 in [facility] Cardiac and Pulmonary Rehab  Picture Your Plate Total Score on Admission 52     Picture Your Plate Scores:  [id number] Unhealthy dietary pattern with much room for improvement.  41-50 Dietary pattern unlikely to meet recommendations for good health and room for improvement.  51-60 More healthful dietary pattern, with some room for improvement.   >60 Healthy dietary pattern, although there may be some specific behaviors that could be improved.   Nutrition Goals Re-Evaluation:   Nutrition Goals Discharge (Final Nutrition Goals Re-Evaluation):   Psychosocial: Target Goals: Acknowledge presence or absence of significant depression and/or stress, maximize coping skills, provide positive support system. Participant is able to verbalize types and ability to use techniques and skills needed for reducing stress and depression.   Education: Stress, Anxiety, and Depression - Group verbal and visual presentation to define topics covered.  Reviews how body is impacted by stress, anxiety, and depression.  Also discusses healthy ways to reduce stress and to treat/manage anxiety and depression.  Written material given at graduation.   Education: Sleep Hygiene -Provides group verbal and written instruction about how sleep can affect your health.  Define sleep hygiene, discuss sleep cycles and impact of sleep habits. Review good sleep hygiene tips.    Initial Review & Psychosocial Screening:  Initial Psych Review & Screening - 04/06/21 1047      Initial Review   Current issues with Current Psychotropic Meds;Current Sleep Concerns;History of Depression      Family Dynamics   Good Support System? No    Strains Intra-family strains    Comments He takes medication to help him sleep at night. He can look to  his one sister for support. He lives by himself and is ok with living by himself.      Barriers   Psychosocial barriers to participate in program The patient should benefit from training in stress management and relaxation.      Screening Interventions   Interventions Encouraged to exercise;To provide support and resources with identified psychosocial needs;Provide feedback about the scores to participant    Expected Outcomes Short Term goal: Utilizing psychosocial counselor, staff and physician to assist with identification of specific Stressors or current issues interfering with healing process. Setting desired goal for each stressor or current issue identified.;Long Term Goal: Stressors or current issues are controlled or eliminated.;Short Term goal: Identification and review with participant of any Quality of Life or Depression concerns found by scoring the questionnaire.;Long Term goal: The participant improves quality of Life and PHQ9 Scores as seen by post scores and/or verbalization of changes           Quality of Life Scores:  Scores of 19 and below usually indicate a poorer quality of life in these areas.  A difference of  2-3 points is a clinically meaningful difference.  A difference of 2-3 points in the total score of the Quality of Life Index has been associated with significant improvement in overall quality of life, self-image, physical symptoms, and general health in studies assessing change in quality of life.  PHQ-9: Recent Review Flowsheet Data    Depression screen [facility] 2/9 04/18/2021 05/07/2013 01/30/2013   Decreased Interest 1 0 0   Down, Depressed, Hopeless 0 0 0   PHQ - 2 Score 1 0 0   Altered sleeping 0 - -   Tired, decreased energy 1 - -   Change in appetite 1 - -   Feeling bad or failure about yourself  0 - -   Trouble concentrating 1 - -   Moving slowly or fidgety/restless 0 - -   Suicidal thoughts 0 - -   PHQ-9 Score 4 - -   Difficult doing work/chores Somewhat  difficult - -     Interpretation of Total Score  Total Score Depression Severity:  1-4 = Minimal depression, 5-9 = Mild depression, 10-14 = Moderate depression, 15-19 = Moderately severe depression, 20-27 = Severe depression   Psychosocial Evaluation and Intervention:  Psychosocial Evaluation - 04/06/21 1053      Psychosocial Evaluation & Interventions   Interventions Encouraged to exercise with the program and follow exercise prescription;Relaxation education;Stress management education    Comments He takes medication to help him sleep at night. He can look to his one sister for support. He lives by himself and is ok with living by himself.    Expected Outcomes Short: Exercise regularly to support mental health and notify  staff of any changes. Long: maintain mental health and well being through teaching of rehab or prescribed medications independently.    Continue Psychosocial Services  Follow up required by staff           Psychosocial Re-Evaluation:   Psychosocial Discharge (Final Psychosocial Re-Evaluation):   Education: Education Goals: Education classes will be provided on a weekly basis, covering required topics. Participant will state understanding/return demonstration of topics presented.  Learning Barriers/Preferences:  Learning Barriers/Preferences - 04/06/21 1045      Learning Barriers/Preferences   Learning Barriers None    Learning Preferences None           General Pulmonary Education Topics:  Infection Prevention: - Provides verbal and written material to individual with discussion of infection control including proper hand washing and proper equipment cleaning during exercise session. Flowsheet Row Pulmonary Rehab from 04/27/2021 in [facility]  Date 04/06/21  Educator [facility]  Instruction Review Code 1- Verbalizes Understanding      Falls Prevention: - Provides verbal and written material to individual with discussion of falls  prevention and safety. Flowsheet Row Pulmonary Rehab from 04/27/2021 in [facility]  Date 04/06/21  Educator [facility]  Instruction Review Code 1- Verbalizes Understanding      Chronic Lung Disease Review: - Group verbal instruction with posters, models, PowerPoint presentations and videos,  to review new updates, new respiratory medications, new advancements in procedures and treatments. Providing information on websites and "800" numbers for continued self-education. Includes information about supplement oxygen, available portable oxygen systems, continuous and intermittent flow rates, oxygen safety, concentrators, and Medicare reimbursement for oxygen. Explanation of Pulmonary Drugs, including class, frequency, complications, importance of spacers, rinsing mouth after steroid MDI's, and proper cleaning methods for nebulizers. Review of basic lung anatomy and physiology related to function, structure, and complications of lung disease. Review of risk factors. Discussion about methods for diagnosing sleep apnea and types of masks and machines for OSA. Includes a review of the use of types of environmental controls: home humidity, furnaces, filters, dust mite/pet prevention, HEPA vacuums. Discussion about weather changes, air quality and the benefits of nasal washing. Instruction on Warning signs, infection symptoms, calling MD promptly, preventive modes, and value of vaccinations. Review of effective airway clearance, coughing and/or vibration techniques. Emphasizing that all should Create an Action Plan. Written material given at graduation.   AED/CPR: - Group verbal and written instruction with the use of models to demonstrate the basic use of the AED with the basic ABC's of resuscitation.    Anatomy and Cardiac Procedures: - Group verbal and visual presentation and models provide information about basic cardiac anatomy and function. Reviews the testing methods done to diagnose heart  disease and the outcomes of the test results. Describes the treatment choices: Medical Management, Angioplasty, or Coronary Bypass Surgery for treating various heart conditions including Myocardial Infarction, Angina, Valve Disease, and Cardiac Arrhythmias.  Written material given at graduation.   Medication Safety: - Group verbal and visual instruction to review commonly prescribed medications for heart and lung disease. Reviews the medication, class of the drug, and side effects. Includes the steps to properly store meds and maintain the prescription regimen.  Written material given at graduation.   Other: -Provides group and verbal instruction on various topics (see comments)   Knowledge Questionnaire Score:  Knowledge Questionnaire Score - 04/18/21 1306      Knowledge Questionnaire Score   Pre Score 13/18: Oxygen sats, ADL, Oxygen prescription  Core Components/Risk Factors/Patient Goals at Admission:  Personal Goals and Risk Factors at Admission - 04/18/21 1310      Core Components/Risk Factors/Patient Goals on Admission    Weight Management Yes;Weight Maintenance    Intervention Weight Management: Develop a combined nutrition and exercise program designed to reach desired caloric intake, while maintaining appropriate intake of nutrient and fiber, sodium and fats, and appropriate energy expenditure required for the weight goal.;Weight Management: Provide education and appropriate resources to help participant work on and attain dietary goals.;Weight Management/Obesity: Establish reasonable short term and long term weight goals.    Admit Weight 167 lb (75.8 kg)    Goal Weight: Short Term 167 lb (75.8 kg)    Goal Weight: Long Term 167 lb (75.8 kg)    Expected Outcomes Short Term: Continue to assess and modify interventions until short term weight is achieved;Long Term: Adherence to nutrition and physical activity/exercise program aimed toward attainment of established weight  goal;Weight Maintenance: Understanding of the daily nutrition guidelines, which includes 25-35% calories from fat, 7% or less cal from saturated fats, less than 200mg  cholesterol, less than 1.5gm of sodium, & 5 or more servings of fruits and vegetables daily;Understanding recommendations for meals to include 15-35% energy as protein, 25-35% energy from fat, 35-60% energy from carbohydrates, less than 200mg  of dietary cholesterol, 20-35 gm of total fiber daily;Understanding of distribution of calorie intake throughout the day with the consumption of 4-5 meals/snacks    Tobacco Cessation Yes    Number of packs per day .25    Intervention Assist the participant in steps to quit. Provide individualized education and counseling about committing to Tobacco Cessation, relapse prevention, and pharmacological support that can be provided by physician.;Advice worker, assist with locating and accessing local/national Quit Smoking programs, and support quit date choice.    Expected Outcomes Short Term: Will demonstrate readiness to quit, by selecting a quit date.;Short Term: Will quit all tobacco product use, adhering to prevention of relapse plan.;Long Term: Complete abstinence from all tobacco products for at least 12 months from quit date.    Improve shortness of breath with ADL's Yes    Intervention Provide education, individualized exercise plan and daily activity instruction to help decrease symptoms of SOB with activities of daily living.    Expected Outcomes Short Term: Improve cardiorespiratory fitness to achieve a reduction of symptoms when performing ADLs;Long Term: Be able to perform more ADLs without symptoms or delay the onset of symptoms    Lipids Yes    Intervention Provide education and support for participant on nutrition & aerobic/resistive exercise along with prescribed medications to achieve LDL 70mg , HDL >40mg .    Expected Outcomes Short Term: Participant states understanding of  desired cholesterol values and is compliant with medications prescribed. Participant is following exercise prescription and nutrition guidelines.;Long Term: Cholesterol controlled with medications as prescribed, with individualized exercise RX and with personalized nutrition plan. Value goals: LDL < 70mg , HDL > 40 mg.           Education:Diabetes - Individual verbal and written instruction to review signs/symptoms of diabetes, desired ranges of glucose level fasting, after meals and with exercise. Acknowledge that pre and post exercise glucose checks will be done for 3 sessions at entry of program.   Know Your Numbers and Heart Failure: - Group verbal and visual instruction to discuss disease risk factors for cardiac and pulmonary disease and treatment options.  Reviews associated critical values for Overweight/Obesity, Hypertension, Cholesterol, and Diabetes.  Discusses basics of  heart failure: signs/symptoms and treatments.  Introduces Heart Failure Zone chart for action plan for heart failure.  Written material given at graduation.   Core Components/Risk Factors/Patient Goals Review:    Core Components/Risk Factors/Patient Goals at Discharge (Final Review):    ITP Comments:  ITP Comments    Row Name 04/06/21 1045 04/18/21 1218 04/25/21 1132 05/11/21 0756     ITP Comments Virtual Visit completed. Patient informed on EP and RD appointment and 6 Minute walk test. Patient also informed of patient health questionnaires on My Chart. Patient Verbalizes understanding. Visit diagnosis can be found in [facility] 03/28/2021. Completed 6MWT and gym orientation. Initial ITP created and sent for review to Dr. Emily Filbert, Medical Director. First full day of exercise!  Patient was oriented to gym and equipment including functions, settings, policies, and procedures.  Patient's individual exercise prescription and treatment plan were reviewed.  All starting workloads were established based on the results of the 6  minute walk test done at initial orientation visit.  The plan for exercise progression was also introduced and progression will be customized based on patient's performance and goals. 30 Day review completed. Medical Director ITP review done, changes made as directed, and signed approval by Medical Director.  New to program           Comments:

## 2021-05-13 ENCOUNTER — Encounter: Payer: Self-pay | Admitting: *Deleted

## 2021-05-13 DIAGNOSIS — J449 Chronic obstructive pulmonary disease, unspecified: Secondary | ICD-10-CM

## 2021-05-13 NOTE — Progress Notes (Signed)
Pulmonary Individual Treatment Plan  Patient Details  Name: LEMARCUS CHRISTINE MRN: [id number] Date of Birth: [birth year] Referring Provider:   Flowsheet Row Pulmonary Rehab from 04/18/2021 in [facility] Cardiac and Pulmonary Rehab  Referring Provider Ottie Glazier MD      Initial Encounter Date:  Flowsheet Row Pulmonary Rehab from 04/18/2021 in [facility] Cardiac and Pulmonary Rehab  Date 04/18/21      Visit Diagnosis: Chronic obstructive pulmonary disease, unspecified COPD type ([place])  Patient's Home Medications on Admission:  Current Outpatient Medications:  .  amLODipine (NORVASC) 5 MG tablet, TAKE 1 TABLET BY MOUTH ONCE A DAY (Patient not taking: Reported on 04/06/2021), Disp: 30 tablet, Rfl: 11 .  amLODipine (NORVASC) 5 MG tablet, Take by mouth., Disp: , Rfl:  .  atorvastatin (LIPITOR) 10 MG tablet, Lipitor 10MG  Oral Tablet QTY: 90 tablet Days: 90 Refills: 0  Written: 01/08/17 Patient Instructions: TAKE 1 TABLET AT BEDTIME, Disp: , Rfl:  .  B-D 3CC LUER-LOK SYR 21GX1" 21G X 1" 3 ML MISC, USE AS DIRECTED WITH TESTOSTERONE, Disp: 24 each, Rfl: 6 .  baclofen (LIORESAL) 10 MG tablet, TAKE 1 TABLET BY MOUTH EVERY DAY, Disp: 30 tablet, Rfl: 11 .  baclofen (LIORESAL) 10 MG tablet, Take by mouth. (Patient not taking: Reported on 04/06/2021), Disp: , Rfl:  .  Budeson-Glycopyrrol-Formoterol (BREZTRI AEROSPHERE) 160-9-4.8 MCG/ACT AERO, INHALE 2 INHALATIONS INTO THE LUNGS TWICE DAILY, Disp: , Rfl:  .  cromolyn (OPTICROM) 4 % ophthalmic solution, SMARTSIG:1-2 Drop(s) In Eye(s) 4-6 Times Daily, Disp: , Rfl:  .  diazepam (VALIUM) 10 MG tablet, TAKE 1 TABLET BY MOUTH EVERY 6 HOURS AS NEEDED (Patient not taking: Reported on 04/06/2021), Disp: 30 tablet, Rfl: 3 .  diazepam (VALIUM) 10 MG tablet, Take by mouth., Disp: , Rfl:  .  dicyclomine (BENTYL) 10 MG capsule, Take 1 capsule (10 mg total) by mouth 4 (four) times daily., Disp: 120 capsule, Rfl: 5 .  ENSURE (ENSURE), Take 237 mLs by mouth 2 (two) times daily between  meals., Disp: 237 mL, Rfl: 11 .  fluticasone (FLONASE) 50 MCG/ACT nasal spray, SQUIRT 2 SPRAYS EACH NOSTRIL ONCE A DAY, Disp: 16 g, Rfl: 6 .  Ipratropium-Albuterol (COMBIVENT) 20-100 MCG/ACT AERS respimat, Inhale into the lungs., Disp: , Rfl:  .  ipratropium-albuterol (DUONEB) 0.5-2.5 (3) MG/3ML SOLN, Inhale into the lungs., Disp: , Rfl:  .  lubiprostone (AMITIZA) 24 MCG capsule, Take 24 mcg by mouth 2 (two) times daily., Disp: , Rfl:  .  MARINOL 10 MG capsule, TAKE ONE CAPSULE BY MOUTH TWICE A DAY BEFORE MEALS (Patient not taking: Reported on 04/06/2021), Disp: 60 capsule, Rfl: 1 .  Melatonin 5 MG CAPS, Take 1 capsule by mouth at bedtime., Disp: , Rfl:  .  morphine (MS CONTIN) 30 MG 12 hr tablet, Take 1 tablet (30 mg total) by mouth 2 (two) times daily., Disp: 60 tablet, Rfl: 0 .  morphine (MSIR) 15 MG tablet, Take 1 tablet (15 mg total) by mouth 3 (three) times daily as needed. (Patient not taking: Reported on 04/06/2021), Disp: 90 tablet, Rfl: 0 .  morphine (MSIR) 30 MG tablet, Take by mouth. (Patient not taking: Reported on 04/06/2021), Disp: , Rfl:  .  Olopatadine HCl (PATADAY) 0.2 % SOLN, Place 1 drop into both eyes 2 (two) times daily. (Patient not taking: Reported on 04/06/2021), Disp: 2.5 mL, Rfl: 4 .  PATADAY 0.2 % SOLN, PLACE 1 DROP INTO BOTH EYES 2 TIMES A DAY (Patient not taking: Reported on 04/06/2021), Disp: 5 mL,  Rfl: 3 .  pregabalin (LYRICA) 50 MG capsule, Take 50 mg by mouth at bedtime., Disp: , Rfl:  .  Spacer/Aero-Holding Chambers (EASIVENT) inhaler, See admin instructions., Disp: , Rfl:  .  testosterone cypionate (DEPOTESTOTERONE CYPIONATE) 200 MG/ML injection, INJECT 0.25 MLS INTO THE MUSCLE EVERY 14 DAYS (Patient not taking: Reported on 04/06/2021), Disp: 10 mL, Rfl: 3 .  TRIUMEQ 600-50-300 MG tablet, Take 1 tablet by mouth daily., Disp: , Rfl:   Past Medical History: Past Medical History:  Diagnosis Date  . Anxiety   . Chronic nausea   . Degenerative disk disease   . HIV  (human immunodeficiency virus infection) ([place])   . Insomnia   . Muscle spasm   . Rotator cuff tear     Tobacco Use: Social History   Tobacco Use  Smoking Status Current Every Day Smoker  . Packs/day: 1.00  . Years: 39.00  . Pack years: 39.00  . Types: Cigarettes  Smokeless Tobacco Never Used  Tobacco Comment   Wears patches. Smokes about 6 cigarettes a week.    Labs: Recent Review Flowsheet Data    Labs for ITP Cardiac and Pulmonary Rehab Latest Ref Rng & Units 09/27/2011 01/17/2012 05/27/2012 08/01/2012 01/01/2013   Cholestrol 0 - 200 mg/dL 152 191 178 169 141   LDLCALC 0 - 99 mg/dL 77 107(H) 99 91 75   HDL >39 mg/dL 46 51 57 61 43   Trlycerides <150 mg/dL 145 166(H) 111 83 113       Pulmonary Assessment Scores:  Pulmonary Assessment Scores    Row Name 04/18/21 1302         ADL UCSD   ADL Phase Entry     SOB Score total 36     Rest 0     Walk 1     Stairs 2     Bath 1     Dress 2     Shop 3           CAT Score   CAT Score 20           mMRC Score   mMRC Score 1            UCSD: Self-administered rating of dyspnea associated with activities of daily living (ADLs) 6-point scale (0 = "not at all" to 5 = "maximal or unable to do because of breathlessness")  Scoring Scores range from 0 to 120.  Minimally important difference is 5 units  CAT: CAT can identify the health impairment of COPD patients and is better correlated with disease progression.  CAT has a scoring range of zero to 40. The CAT score is classified into four groups of low (less than 10), medium (10 - 20), high (21-30) and very high (31-40) based on the impact level of disease on health status. A CAT score over 10 suggests significant symptoms.  A worsening CAT score could be explained by an exacerbation, poor medication adherence, poor inhaler technique, or progression of COPD or comorbid conditions.  CAT MCID is 2 points  mMRC: mMRC (Modified Medical Research Council) Dyspnea Scale is used to  assess the degree of baseline functional disability in patients of respiratory disease due to dyspnea. No minimal important difference is established. A decrease in score of 1 point or greater is considered a positive change.   Pulmonary Function Assessment:  Pulmonary Function Assessment - 04/06/21 1044      Breath   Shortness of Breath Yes;Limiting activity  Exercise Target Goals: Exercise Program Goal: Individual exercise prescription set using results from initial 6 min walk test and THRR while considering  patient's activity barriers and safety.   Exercise Prescription Goal: Initial exercise prescription builds to 30-45 minutes a day of aerobic activity, 2-3 days per week.  Home exercise guidelines will be given to patient during program as part of exercise prescription that the participant will acknowledge.  Education: Aerobic Exercise: - Group verbal and visual presentation on the components of exercise prescription. Introduces F.I.T.T principle from ACSM for exercise prescriptions.  Reviews F.I.T.T. principles of aerobic exercise including progression. Written material given at graduation.   Education: Resistance Exercise: - Group verbal and visual presentation on the components of exercise prescription. Introduces F.I.T.T principle from ACSM for exercise prescriptions  Reviews F.I.T.T. principles of resistance exercise including progression. Written material given at graduation.    Education: Exercise & Equipment Safety: - Individual verbal instruction and demonstration of equipment use and safety with use of the equipment. Flowsheet Row Pulmonary Rehab from 04/27/2021 in [facility] Cardiac and Pulmonary Rehab  Date 04/06/21  Educator [facility]  Instruction Review Code 1- Verbalizes Understanding      Education: Exercise Physiology & General Exercise Guidelines: - Group verbal and written instruction with models to review the exercise physiology of the cardiovascular system  and associated critical values. Provides general exercise guidelines with specific guidelines to those with heart or lung disease.    Education: Flexibility, Balance, Mind/Body Relaxation: - Group verbal and visual presentation with interactive activity on the components of exercise prescription. Introduces F.I.T.T principle from ACSM for exercise prescriptions. Reviews F.I.T.T. principles of flexibility and balance exercise training including progression. Also discusses the mind body connection.  Reviews various relaxation techniques to help reduce and manage stress (i.e. Deep breathing, progressive muscle relaxation, and visualization). Balance handout provided to take home. Written material given at graduation. Flowsheet Row Pulmonary Rehab from 04/27/2021 in [facility] Cardiac and Pulmonary Rehab  Date 04/27/21  Educator [facility]  Instruction Review Code 1- Verbalizes Understanding      Activity Barriers & Risk Stratification:   6 Minute Walk:  6 Minute Walk    Row Name 04/18/21 1236         6 Minute Walk   Phase Initial     Distance 905 feet     Walk Time 6 minutes     # of Rest Breaks 0     MPH 1.71     METS 2.65     RPE 9     Perceived Dyspnea  1     VO2 Peak 9.3     Symptoms Yes (comment)     Comments Slight SOB     Resting HR 76 bpm     Resting BP 104/62     Resting Oxygen Saturation  95 %     Exercise Oxygen Saturation  during 6 min walk 87 %     Max Ex. HR 97 bpm     Max Ex. BP 128/64     2 Minute Post BP 110/64           Interval HR   1 Minute HR 89     2 Minute HR 93     3 Minute HR 93     4 Minute HR 96     5 Minute HR 97     6 Minute HR 96     2 Minute Post HR 78     Interval Heart Rate? Yes  Interval Oxygen   Interval Oxygen? Yes     Baseline Oxygen Saturation % 95 %     1 Minute Oxygen Saturation % 93 %     1 Minute Liters of Oxygen 0 L  RA     2 Minute Oxygen Saturation % 92 %     2 Minute Liters of Oxygen 0 L     3 Minute Oxygen Saturation %  89 %     3 Minute Liters of Oxygen 0 L     4 Minute Oxygen Saturation % 88 %     4 Minute Liters of Oxygen 0 L     5 Minute Oxygen Saturation % 88 %     5 Minute Liters of Oxygen 0 L     6 Minute Oxygen Saturation % 87 %     6 Minute Liters of Oxygen 0 L     2 Minute Post Oxygen Saturation % 91 %     2 Minute Post Liters of Oxygen 0 L           Oxygen Initial Assessment:  Oxygen Initial Assessment - 04/18/21 1302      Home Oxygen   Home Oxygen Device None    Sleep Oxygen Prescription None    Home Exercise Oxygen Prescription None    Home Resting Oxygen Prescription None    Compliance with Home Oxygen Use Yes      Initial 6 min Walk   Oxygen Used None      Program Oxygen Prescription   Program Oxygen Prescription None      Intervention   Short Term Goals To learn and understand importance of monitoring SPO2 with pulse oximeter and demonstrate accurate use of the pulse oximeter.;To learn and understand importance of maintaining oxygen saturations>88%;To learn and demonstrate proper pursed lip breathing techniques or other breathing techniques.;To learn and demonstrate proper use of respiratory medications    Long  Term Goals Verbalizes importance of monitoring SPO2 with pulse oximeter and return demonstration;Maintenance of O2 saturations>88%;Exhibits proper breathing techniques, such as pursed lip breathing or other method taught during program session;Compliance with respiratory medication;Demonstrates proper use of MDI's           Oxygen Re-Evaluation:  Oxygen Re-Evaluation    Row Name 04/25/21 1138             Program Oxygen Prescription   Program Oxygen Prescription None               Home Oxygen   Home Oxygen Device None       Sleep Oxygen Prescription None       Home Exercise Oxygen Prescription None       Home Resting Oxygen Prescription None               Goals/Expected Outcomes   Short Term Goals To learn and understand importance of monitoring SPO2  with pulse oximeter and demonstrate accurate use of the pulse oximeter.;To learn and understand importance of maintaining oxygen saturations>88%;To learn and demonstrate proper pursed lip breathing techniques or other breathing techniques.       Long  Term Goals Verbalizes importance of monitoring SPO2 with pulse oximeter and return demonstration;Maintenance of O2 saturations>88%;Exhibits proper breathing techniques, such as pursed lip breathing or other method taught during program session;Exhibits compliance with exercise, home and travel O2 prescription;Compliance with respiratory medication       Comments Reviewed PLB technique with pt.  Talked about how it works and it's importance in  maintaining their exercise saturations.       Goals/Expected Outcomes Short: Become more profiecient at using PLB.   Long: Become independent at using PLB.              Oxygen Discharge (Final Oxygen Re-Evaluation):  Oxygen Re-Evaluation - 04/25/21 1138      Program Oxygen Prescription   Program Oxygen Prescription None      Home Oxygen   Home Oxygen Device None    Sleep Oxygen Prescription None    Home Exercise Oxygen Prescription None    Home Resting Oxygen Prescription None      Goals/Expected Outcomes   Short Term Goals To learn and understand importance of monitoring SPO2 with pulse oximeter and demonstrate accurate use of the pulse oximeter.;To learn and understand importance of maintaining oxygen saturations>88%;To learn and demonstrate proper pursed lip breathing techniques or other breathing techniques.    Long  Term Goals Verbalizes importance of monitoring SPO2 with pulse oximeter and return demonstration;Maintenance of O2 saturations>88%;Exhibits proper breathing techniques, such as pursed lip breathing or other method taught during program session;Exhibits compliance with exercise, home and travel O2 prescription;Compliance with respiratory medication    Comments Reviewed PLB technique with  pt.  Talked about how it works and it's importance in maintaining their exercise saturations.    Goals/Expected Outcomes Short: Become more profiecient at using PLB.   Long: Become independent at using PLB.           Initial Exercise Prescription:  Initial Exercise Prescription - 04/18/21 1200      Date of Initial Exercise RX and Referring Provider   Date 04/18/21    Referring Provider Ottie Glazier MD      Recumbant Bike   Level 2    RPM 60    Watts 15    Minutes 15    METs 2.65      NuStep   Level 2    SPM 80    Minutes 15    METs 2.65      Arm Ergometer   Level --    RPM --    Minutes --    METs --      T5 Nustep   Level 1    SPM 80    Minutes 15    METs 2.65      Track   Laps 25    Minutes 15    METs 2.4      Prescription Details   Frequency (times per week) 3    Duration Progress to 30 minutes of continuous aerobic without signs/symptoms of physical distress      Intensity   THRR 40-80% of Max Heartrate 107-138    Ratings of Perceived Exertion 11-13    Perceived Dyspnea 0-4      Progression   Progression Continue to progress workloads to maintain intensity without signs/symptoms of physical distress.      Resistance Training   Training Prescription Yes    Weight 3 lb    Reps 10-15           Perform Capillary Blood Glucose checks as needed.  Exercise Prescription Changes:   Exercise Prescription Changes    Row Name 04/18/21 1200 04/27/21 1400           Response to Exercise   Blood Pressure (Admit) 104/63 122/60      Blood Pressure (Exercise) 128/64 184/80      Blood Pressure (Exit) 110/64 110/60      Heart Rate (  Admit) 76 bpm 85 bpm      Heart Rate (Exercise) 97 bpm 99 bpm      Heart Rate (Exit) 81 bpm 80 bpm      Oxygen Saturation (Admit) 95 % 91 %      Oxygen Saturation (Exercise) 87 % 93 %      Oxygen Saturation (Exit) 94 % 93 %      Rating of Perceived Exertion (Exercise) 9 13      Perceived Dyspnea (Exercise) 1 1       Symptoms Slight SOB --      Comments walk test results second day of exercise      Duration -- Progress to 30 minutes of  aerobic without signs/symptoms of physical distress      Intensity -- THRR unchanged             Progression   Progression -- Continue to progress workloads to maintain intensity without signs/symptoms of physical distress.      Average METs -- 2.13             Resistance Training   Training Prescription -- Yes      Weight -- 3 lb      Reps -- 10-15             Interval Training   Interval Training -- No             Recumbant Bike   Level -- 2      Minutes -- 15      METs -- 3             NuStep   Level -- 2      Minutes -- 15      METs -- 1.8             Exercise Comments:   Exercise Comments    Row Name 04/25/21 1132           Exercise Comments First full day of exercise!  Patient was oriented to gym and equipment including functions, settings, policies, and procedures.  Patient's individual exercise prescription and treatment plan were reviewed.  All starting workloads were established based on the results of the 6 minute walk test done at initial orientation visit.  The plan for exercise progression was also introduced and progression will be customized based on patient's performance and goals.              Exercise Goals and Review:   Exercise Goals    Row Name 04/18/21 1309             Exercise Goals   Increase Physical Activity Yes       Intervention Provide advice, education, support and counseling about physical activity/exercise needs.;Develop an individualized exercise prescription for aerobic and resistive training based on initial evaluation findings, risk stratification, comorbidities and participant's personal goals.       Expected Outcomes Short Term: Attend rehab on a regular basis to increase amount of physical activity.;Long Term: Add in home exercise to make exercise part of routine and to increase amount of  physical activity.;Long Term: Exercising regularly at least 3-5 days a week.       Increase Strength and Stamina Yes       Intervention Provide advice, education, support and counseling about physical activity/exercise needs.;Develop an individualized exercise prescription for aerobic and resistive training based on initial evaluation findings, risk stratification, comorbidities and participant's personal goals.  Expected Outcomes Short Term: Increase workloads from initial exercise prescription for resistance, speed, and METs.;Short Term: Perform resistance training exercises routinely during rehab and add in resistance training at home;Long Term: Improve cardiorespiratory fitness, muscular endurance and strength as measured by increased METs and functional capacity (6MWT)       Able to understand and use rate of perceived exertion (RPE) scale Yes       Intervention Provide education and explanation on how to use RPE scale       Expected Outcomes Short Term: Able to use RPE daily in rehab to express subjective intensity level;Long Term:  Able to use RPE to guide intensity level when exercising independently       Able to understand and use Dyspnea scale Yes       Intervention Provide education and explanation on how to use Dyspnea scale       Expected Outcomes Short Term: Able to use Dyspnea scale daily in rehab to express subjective sense of shortness of breath during exertion;Long Term: Able to use Dyspnea scale to guide intensity level when exercising independently       Knowledge and understanding of Target Heart Rate Range (THRR) Yes       Intervention Provide education and explanation of THRR including how the numbers were predicted and where they are located for reference       Expected Outcomes Short Term: Able to state/look up THRR;Short Term: Able to use daily as guideline for intensity in rehab;Long Term: Able to use THRR to govern intensity when exercising independently       Able to  check pulse independently Yes       Intervention Provide education and demonstration on how to check pulse in carotid and radial arteries.;Review the importance of being able to check your own pulse for safety during independent exercise       Expected Outcomes Short Term: Able to explain why pulse checking is important during independent exercise;Long Term: Able to check pulse independently and accurately       Understanding of Exercise Prescription Yes       Intervention Provide education, explanation, and written materials on patient's individual exercise prescription       Expected Outcomes Short Term: Able to explain program exercise prescription;Long Term: Able to explain home exercise prescription to exercise independently              Exercise Goals Re-Evaluation :  Exercise Goals Re-Evaluation    [place] Name 04/25/21 1137 04/27/21 1418           Exercise Goal Re-Evaluation   Exercise Goals Review Increase Physical Activity;Able to understand and use rate of perceived exertion (RPE) scale;Knowledge and understanding of Target Heart Rate Range (THRR);Understanding of Exercise Prescription;Increase Strength and Stamina;Able to understand and use Dyspnea scale;Able to check pulse independently Increase Physical Activity;Increase Strength and Stamina;Understanding of Exercise Prescription      Comments Reviewed RPE and dyspnea scales, THR and program prescription with pt today.  Pt voiced understanding and was given a copy of goals to take home. Louie Casa has completed his first two full days of exercise.  He did well these two days.  We will continue to montior his progress.      Expected Outcomes Short: Use RPE daily to regulate intensity. Long: Follow program prescription in THR. Short: Continue to attend rehab regularly Long: Continue to follow program prescription             Discharge Exercise Prescription (Final  Exercise Prescription Changes):  Exercise Prescription Changes - 04/27/21  1400      Response to Exercise   Blood Pressure (Admit) 122/60    Blood Pressure (Exercise) 184/80    Blood Pressure (Exit) 110/60    Heart Rate (Admit) 85 bpm    Heart Rate (Exercise) 99 bpm    Heart Rate (Exit) 80 bpm    Oxygen Saturation (Admit) 91 %    Oxygen Saturation (Exercise) 93 %    Oxygen Saturation (Exit) 93 %    Rating of Perceived Exertion (Exercise) 13    Perceived Dyspnea (Exercise) 1    Comments second day of exercise    Duration Progress to 30 minutes of  aerobic without signs/symptoms of physical distress    Intensity THRR unchanged      Progression   Progression Continue to progress workloads to maintain intensity without signs/symptoms of physical distress.    Average METs 2.13      Resistance Training   Training Prescription Yes    Weight 3 lb    Reps 10-15      Interval Training   Interval Training No      Recumbant Bike   Level 2    Minutes 15    METs 3      NuStep   Level 2    Minutes 15    METs 1.8           Nutrition:  Target Goals: Understanding of nutrition guidelines, daily intake of sodium 1500mg , cholesterol 200mg , calories 30% from fat and 7% or less from saturated fats, daily to have 5 or more servings of fruits and vegetables.  Education: All About Nutrition: -Group instruction provided by verbal, written material, interactive activities, discussions, models, and posters to present general guidelines for heart healthy nutrition including fat, fiber, MyPlate, the role of sodium in heart healthy nutrition, utilization of the nutrition label, and utilization of this knowledge for meal planning. Follow up email sent as well. Written material given at graduation.   Biometrics:  Pre Biometrics - 04/18/21 1223      Pre Biometrics   Height 5' 10.2" (1.783 m)    Weight 167 lb 9.6 oz (76 kg)    BMI (Calculated) 23.91    Single Leg Stand 30 seconds            Nutrition Therapy Plan and Nutrition Goals:   Nutrition  Assessments:  MEDIFICTS Score Key:  ?70 Need to make dietary changes   40-70 Heart Healthy Diet  ? 40 Therapeutic Level Cholesterol Diet  Flowsheet Row Pulmonary Rehab from 04/18/2021 in [facility] Cardiac and Pulmonary Rehab  Picture Your Plate Total Score on Admission 52     Picture Your Plate Scores:  <01 Unhealthy dietary pattern with much room for improvement.  41-50 Dietary pattern unlikely to meet recommendations for good health and room for improvement.  51-60 More healthful dietary pattern, with some room for improvement.   >60 Healthy dietary pattern, although there may be some specific behaviors that could be improved.   Nutrition Goals Re-Evaluation:   Nutrition Goals Discharge (Final Nutrition Goals Re-Evaluation):   Psychosocial: Target Goals: Acknowledge presence or absence of significant depression and/or stress, maximize coping skills, provide positive support system. Participant is able to verbalize types and ability to use techniques and skills needed for reducing stress and depression.   Education: Stress, Anxiety, and Depression - Group verbal and visual presentation to define topics covered.  Reviews how body is impacted by stress,  anxiety, and depression.  Also discusses healthy ways to reduce stress and to treat/manage anxiety and depression.  Written material given at graduation.   Education: Sleep Hygiene -Provides group verbal and written instruction about how sleep can affect your health.  Define sleep hygiene, discuss sleep cycles and impact of sleep habits. Review good sleep hygiene tips.    Initial Review & Psychosocial Screening:  Initial Psych Review & Screening - 04/06/21 1047      Initial Review   Current issues with Current Psychotropic Meds;Current Sleep Concerns;History of Depression      Family Dynamics   Good Support System? No    Strains Intra-family strains    Comments He takes medication to help him sleep at night. He can look to  his one sister for support. He lives by himself and is ok with living by himself.      Barriers   Psychosocial barriers to participate in program The patient should benefit from training in stress management and relaxation.      Screening Interventions   Interventions Encouraged to exercise;To provide support and resources with identified psychosocial needs;Provide feedback about the scores to participant    Expected Outcomes Short Term goal: Utilizing psychosocial counselor, staff and physician to assist with identification of specific Stressors or current issues interfering with healing process. Setting desired goal for each stressor or current issue identified.;Long Term Goal: Stressors or current issues are controlled or eliminated.;Short Term goal: Identification and review with participant of any Quality of Life or Depression concerns found by scoring the questionnaire.;Long Term goal: The participant improves quality of Life and PHQ9 Scores as seen by post scores and/or verbalization of changes           Quality of Life Scores:  Scores of 19 and below usually indicate a poorer quality of life in these areas.  A difference of  2-3 points is a clinically meaningful difference.  A difference of 2-3 points in the total score of the Quality of Life Index has been associated with significant improvement in overall quality of life, self-image, physical symptoms, and general health in studies assessing change in quality of life.  PHQ-9: Recent Review Flowsheet Data    Depression screen [facility] 2/9 04/18/2021 05/07/2013 01/30/2013   Decreased Interest 1 0 0   Down, Depressed, Hopeless 0 0 0   PHQ - 2 Score 1 0 0   Altered sleeping 0 - -   Tired, decreased energy 1 - -   Change in appetite 1 - -   Feeling bad or failure about yourself  0 - -   Trouble concentrating 1 - -   Moving slowly or fidgety/restless 0 - -   Suicidal thoughts 0 - -   PHQ-9 Score 4 - -   Difficult doing work/chores Somewhat  difficult - -     Interpretation of Total Score  Total Score Depression Severity:  1-4 = Minimal depression, 5-9 = Mild depression, 10-14 = Moderate depression, 15-19 = Moderately severe depression, 20-27 = Severe depression   Psychosocial Evaluation and Intervention:  Psychosocial Evaluation - 04/06/21 1053      Psychosocial Evaluation & Interventions   Interventions Encouraged to exercise with the program and follow exercise prescription;Relaxation education;Stress management education    Comments He takes medication to help him sleep at night. He can look to his one sister for support. He lives by himself and is ok with living by himself.    Expected Outcomes Short: Exercise regularly to support  mental health and notify staff of any changes. Long: maintain mental health and well being through teaching of rehab or prescribed medications independently.    Continue Psychosocial Services  Follow up required by staff           Psychosocial Re-Evaluation:   Psychosocial Discharge (Final Psychosocial Re-Evaluation):   Education: Education Goals: Education classes will be provided on a weekly basis, covering required topics. Participant will state understanding/return demonstration of topics presented.  Learning Barriers/Preferences:  Learning Barriers/Preferences - 04/06/21 1045      Learning Barriers/Preferences   Learning Barriers None    Learning Preferences None           General Pulmonary Education Topics:  Infection Prevention: - Provides verbal and written material to individual with discussion of infection control including proper hand washing and proper equipment cleaning during exercise session. Flowsheet Row Pulmonary Rehab from 04/27/2021 in [facility]  Date 04/06/21  Educator [facility]  Instruction Review Code 1- Verbalizes Understanding      Falls Prevention: - Provides verbal and written material to individual with discussion of falls  prevention and safety. Flowsheet Row Pulmonary Rehab from 04/27/2021 in [facility]  Date 04/06/21  Educator [facility]  Instruction Review Code 1- Verbalizes Understanding      Chronic Lung Disease Review: - Group verbal instruction with posters, models, PowerPoint presentations and videos,  to review new updates, new respiratory medications, new advancements in procedures and treatments. Providing information on websites and "800" numbers for continued self-education. Includes information about supplement oxygen, available portable oxygen systems, continuous and intermittent flow rates, oxygen safety, concentrators, and Medicare reimbursement for oxygen. Explanation of Pulmonary Drugs, including class, frequency, complications, importance of spacers, rinsing mouth after steroid MDI's, and proper cleaning methods for nebulizers. Review of basic lung anatomy and physiology related to function, structure, and complications of lung disease. Review of risk factors. Discussion about methods for diagnosing sleep apnea and types of masks and machines for OSA. Includes a review of the use of types of environmental controls: home humidity, furnaces, filters, dust mite/pet prevention, HEPA vacuums. Discussion about weather changes, air quality and the benefits of nasal washing. Instruction on Warning signs, infection symptoms, calling MD promptly, preventive modes, and value of vaccinations. Review of effective airway clearance, coughing and/or vibration techniques. Emphasizing that all should Create an Action Plan. Written material given at graduation.   AED/CPR: - Group verbal and written instruction with the use of models to demonstrate the basic use of the AED with the basic ABC's of resuscitation.    Anatomy and Cardiac Procedures: - Group verbal and visual presentation and models provide information about basic cardiac anatomy and function. Reviews the testing methods done to diagnose heart  disease and the outcomes of the test results. Describes the treatment choices: Medical Management, Angioplasty, or Coronary Bypass Surgery for treating various heart conditions including Myocardial Infarction, Angina, Valve Disease, and Cardiac Arrhythmias.  Written material given at graduation.   Medication Safety: - Group verbal and visual instruction to review commonly prescribed medications for heart and lung disease. Reviews the medication, class of the drug, and side effects. Includes the steps to properly store meds and maintain the prescription regimen.  Written material given at graduation.   Other: -Provides group and verbal instruction on various topics (see comments)   Knowledge Questionnaire Score:  Knowledge Questionnaire Score - 04/18/21 1306      Knowledge Questionnaire Score   Pre Score 13/18: Oxygen sats, ADL,  Oxygen prescription            Core Components/Risk Factors/Patient Goals at Admission:  Personal Goals and Risk Factors at Admission - 04/18/21 1310      Core Components/Risk Factors/Patient Goals on Admission    Weight Management Yes;Weight Maintenance    Intervention Weight Management: Develop a combined nutrition and exercise program designed to reach desired caloric intake, while maintaining appropriate intake of nutrient and fiber, sodium and fats, and appropriate energy expenditure required for the weight goal.;Weight Management: Provide education and appropriate resources to help participant work on and attain dietary goals.;Weight Management/Obesity: Establish reasonable short term and long term weight goals.    Admit Weight 167 lb (75.8 kg)    Goal Weight: Short Term 167 lb (75.8 kg)    Goal Weight: Long Term 167 lb (75.8 kg)    Expected Outcomes Short Term: Continue to assess and modify interventions until short term weight is achieved;Long Term: Adherence to nutrition and physical activity/exercise program aimed toward attainment of established weight  goal;Weight Maintenance: Understanding of the daily nutrition guidelines, which includes 25-35% calories from fat, 7% or less cal from saturated fats, less than 200mg  cholesterol, less than 1.5gm of sodium, & 5 or more servings of fruits and vegetables daily;Understanding recommendations for meals to include 15-35% energy as protein, 25-35% energy from fat, 35-60% energy from carbohydrates, less than 200mg  of dietary cholesterol, 20-35 gm of total fiber daily;Understanding of distribution of calorie intake throughout the day with the consumption of 4-5 meals/snacks    Tobacco Cessation Yes    Number of packs per day .25    Intervention Assist the participant in steps to quit. Provide individualized education and counseling about committing to Tobacco Cessation, relapse prevention, and pharmacological support that can be provided by physician.;Advice worker, assist with locating and accessing local/national Quit Smoking programs, and support quit date choice.    Expected Outcomes Short Term: Will demonstrate readiness to quit, by selecting a quit date.;Short Term: Will quit all tobacco product use, adhering to prevention of relapse plan.;Long Term: Complete abstinence from all tobacco products for at least 12 months from quit date.    Improve shortness of breath with ADL's Yes    Intervention Provide education, individualized exercise plan and daily activity instruction to help decrease symptoms of SOB with activities of daily living.    Expected Outcomes Short Term: Improve cardiorespiratory fitness to achieve a reduction of symptoms when performing ADLs;Long Term: Be able to perform more ADLs without symptoms or delay the onset of symptoms    Lipids Yes    Intervention Provide education and support for participant on nutrition & aerobic/resistive exercise along with prescribed medications to achieve LDL 70mg , HDL >40mg .    Expected Outcomes Short Term: Participant states understanding of  desired cholesterol values and is compliant with medications prescribed. Participant is following exercise prescription and nutrition guidelines.;Long Term: Cholesterol controlled with medications as prescribed, with individualized exercise RX and with personalized nutrition plan. Value goals: LDL < 70mg , HDL > 40 mg.           Education:Diabetes - Individual verbal and written instruction to review signs/symptoms of diabetes, desired ranges of glucose level fasting, after meals and with exercise. Acknowledge that pre and post exercise glucose checks will be done for 3 sessions at entry of program.   Know Your Numbers and Heart Failure: - Group verbal and visual instruction to discuss disease risk factors for cardiac and pulmonary disease and treatment options.  Reviews  associated critical values for Overweight/Obesity, Hypertension, Cholesterol, and Diabetes.  Discusses basics of heart failure: signs/symptoms and treatments.  Introduces Heart Failure Zone chart for action plan for heart failure.  Written material given at graduation.   Core Components/Risk Factors/Patient Goals Review:    Core Components/Risk Factors/Patient Goals at Discharge (Final Review):    ITP Comments:  ITP Comments    Row Name 04/06/21 1045 04/18/21 1218 04/25/21 1132 05/11/21 0756 05/12/21 1333   ITP Comments Virtual Visit completed. Patient informed on EP and RD appointment and 6 Minute walk test. Patient also informed of patient health questionnaires on My Chart. Patient Verbalizes understanding. Visit diagnosis can be found in [facility] 03/28/2021. Completed 6MWT and gym orientation. Initial ITP created and sent for review to Dr. Emily Filbert, Medical Director. First full day of exercise!  Patient was oriented to gym and equipment including functions, settings, policies, and procedures.  Patient's individual exercise prescription and treatment plan were reviewed.  All starting workloads were established based on the  results of the 6 minute walk test done at initial orientation visit.  The plan for exercise progression was also introduced and progression will be customized based on patient's performance and goals. 30 Day review completed. Medical Director ITP review done, changes made as directed, and signed approval by Medical Director.  New to program Andras has not attended since last review.   [place] Name 05/13/21 1029           ITP Comments Kirk wishes to be discharged from Pulmonary Rehab because of his back pain. Staff encouraged him to get his pain evaluated and under control before pursuing returning to Pulmonary Rehab.              Comments: Discharge ITP

## 2021-05-24 ENCOUNTER — Ambulatory Visit: Payer: Medicare Other | Admitting: Infectious Diseases

## 2021-05-31 DIAGNOSIS — Z79891 Long term (current) use of opiate analgesic: Secondary | ICD-10-CM | POA: Diagnosis not present

## 2021-06-06 DIAGNOSIS — M150 Primary generalized (osteo)arthritis: Secondary | ICD-10-CM | POA: Diagnosis not present

## 2021-06-06 DIAGNOSIS — M546 Pain in thoracic spine: Secondary | ICD-10-CM | POA: Diagnosis not present

## 2021-06-06 DIAGNOSIS — G894 Chronic pain syndrome: Secondary | ICD-10-CM | POA: Diagnosis not present

## 2021-06-06 DIAGNOSIS — M545 Low back pain, unspecified: Secondary | ICD-10-CM | POA: Diagnosis not present

## 2021-06-06 DIAGNOSIS — G8929 Other chronic pain: Secondary | ICD-10-CM | POA: Diagnosis not present

## 2021-06-06 DIAGNOSIS — M542 Cervicalgia: Secondary | ICD-10-CM | POA: Diagnosis not present

## 2021-06-15 DIAGNOSIS — Z79891 Long term (current) use of opiate analgesic: Secondary | ICD-10-CM | POA: Diagnosis not present

## 2021-06-15 DIAGNOSIS — M546 Pain in thoracic spine: Secondary | ICD-10-CM | POA: Diagnosis not present

## 2021-06-15 DIAGNOSIS — M545 Low back pain, unspecified: Secondary | ICD-10-CM | POA: Diagnosis not present

## 2021-06-15 DIAGNOSIS — M542 Cervicalgia: Secondary | ICD-10-CM | POA: Diagnosis not present

## 2021-06-15 DIAGNOSIS — G894 Chronic pain syndrome: Secondary | ICD-10-CM | POA: Diagnosis not present

## 2021-06-15 DIAGNOSIS — M150 Primary generalized (osteo)arthritis: Secondary | ICD-10-CM | POA: Diagnosis not present

## 2021-06-15 DIAGNOSIS — G8929 Other chronic pain: Secondary | ICD-10-CM | POA: Diagnosis not present

## 2021-06-21 ENCOUNTER — Ambulatory Visit: Payer: Medicare Other | Attending: Infectious Diseases | Admitting: Infectious Diseases

## 2021-06-21 ENCOUNTER — Other Ambulatory Visit
Admission: RE | Admit: 2021-06-21 | Discharge: 2021-06-21 | Disposition: A | Discharge status: Home / Self Care | Payer: Medicare Other | Source: Ambulatory Visit | Attending: Infectious Diseases | Admitting: Infectious Diseases

## 2021-06-21 ENCOUNTER — Other Ambulatory Visit: Payer: Self-pay

## 2021-06-21 VITALS — BP 125/76 | HR 88 | Temp 97.5°F | Resp 16 | Ht 70.5 in | Wt 166.2 lb

## 2021-06-21 DIAGNOSIS — J449 Chronic obstructive pulmonary disease, unspecified: Secondary | ICD-10-CM | POA: Diagnosis not present

## 2021-06-21 DIAGNOSIS — B20 Human immunodeficiency virus [HIV] disease: Secondary | ICD-10-CM | POA: Diagnosis not present

## 2021-06-21 DIAGNOSIS — Z8619 Personal history of other infectious and parasitic diseases: Secondary | ICD-10-CM | POA: Diagnosis not present

## 2021-06-21 DIAGNOSIS — E785 Hyperlipidemia, unspecified: Secondary | ICD-10-CM | POA: Insufficient documentation

## 2021-06-21 DIAGNOSIS — F17210 Nicotine dependence, cigarettes, uncomplicated: Secondary | ICD-10-CM | POA: Diagnosis not present

## 2021-06-21 DIAGNOSIS — I10 Essential (primary) hypertension: Secondary | ICD-10-CM | POA: Diagnosis not present

## 2021-06-21 DIAGNOSIS — Z79891 Long term (current) use of opiate analgesic: Secondary | ICD-10-CM | POA: Diagnosis not present

## 2021-06-21 LAB — COMPREHENSIVE METABOLIC PANEL
ALT: 10 U/L (ref 0–44)
AST: 28 U/L (ref 15–41)
Albumin: 4.7 g/dL (ref 3.5–5.0)
Alkaline Phosphatase: 39 U/L (ref 38–126)
Anion gap: 9 (ref 5–15)
BUN: 9 mg/dL (ref 8–23)
CO2: 30 mmol/L (ref 22–32)
Calcium: 9.2 mg/dL (ref 8.9–10.3)
Chloride: 101 mmol/L (ref 98–111)
Creatinine, Ser: 1.22 mg/dL (ref 0.61–1.24)
GFR, Estimated: >60 mL/min (ref >60)
Glucose, Bld: 132 mg/dL — ABNORMAL HIGH (ref 70–99)
Potassium: 4.1 mmol/L (ref 3.5–5.1)
Sodium: 140 mmol/L (ref 135–145)
Total Bilirubin: 0.7 mg/dL (ref 0.3–1.2)
Total Protein: 7.7 g/dL (ref 6.5–8.1)

## 2021-06-21 LAB — HEPATITIS B SURFACE ANTIGEN: Hepatitis B Surface Ag: NONREACTIVE

## 2021-06-21 LAB — HEPATITIS C ANTIBODY: HCV Ab: REACTIVE — AB

## 2021-06-21 NOTE — Progress Notes (Signed)
NAME: Albert Wong  DOB: 1956/01/28  MRN: [id number]  Date/Time: 06/21/2021 10:12 AM   Subjective:  Pt is transferring his HIV care to me from [facility] center ? Albert Wong is a 65 y.o. with a history of HIV disease diagnose din early 23s , Treated hepatitis C , COPD, current smoker, on MS contin for back pain ( pain management by Dr.Montique  Humphrey Rolls), HTN, hyperlipidemia  HIV diagnosed  early 60s., says it was a routine blood test, - was in care at [facility] for many years and then went to see Dr.Fitzgerald in 2014-2019, then [facility]  Nadir Cd4 DK- on  08/14/1991 was 440 VL  OI -None  HAARt history First regimen  Azt based Atripla Now on Triumeq 100% adherent to HAART- says last Vl < 20  Acquired thru sex with men Not sexually active in 15 years he says Genotype unknown ? Past Medical History:  Diagnosis Date   Anxiety    Chronic nausea    Degenerative disk disease    HIV (human immunodeficiency virus infection) ([place])    Insomnia    Muscle spasm    Rotator cuff tear     No past surgical history on file.  Social History   Socioeconomic History   Marital status: Single    Spouse name: Not on file   Number of children: Not on file   Years of education: Not on file   Highest education level: Not on file  Occupational History   Not on file  Tobacco Use   Smoking status: Every Day    Packs/day: 1.00    Years: 39.00    Pack years: 39.00    Types: Cigarettes   Smokeless tobacco: Never   Tobacco comments:    Wears patches. Smokes about 6 cigarettes a week.  Substance and Sexual Activity   Alcohol use: Yes   Drug use: No   Sexual activity: Not on file  Other Topics Concern   Not on file  Social History Narrative   Not on file   Social Determinants of Health   Financial Resource Strain: Not on file  Food Insecurity: Not on file  Transportation Needs: Not on file  Physical Activity: Not on file  Stress: Not on file  Social Connections: Not on file  Intimate  Partner Violence: Not on file    Family History  Problem Relation Age of Onset   Heart disease Father   Mother breast cancer No Known Allergies ? Current Outpatient Medications  Medication Sig Dispense Refill   amLODipine (NORVASC) 5 MG tablet Take by mouth.     atorvastatin (LIPITOR) 20 MG tablet SMARTSIG:1 Tablet(s) By Mouth Every Evening     B-D 3CC LUER-LOK SYR 21GX1" 21G X 1" 3 ML MISC USE AS DIRECTED WITH TESTOSTERONE 24 each 6   baclofen (LIORESAL) 10 MG tablet TAKE 1 TABLET BY MOUTH EVERY DAY 30 tablet 11   Budeson-Glycopyrrol-Formoterol (BREZTRI AEROSPHERE) 160-9-4.8 MCG/ACT AERO INHALE 2 INHALATIONS INTO THE LUNGS TWICE DAILY     cromolyn (OPTICROM) 4 % ophthalmic solution SMARTSIG:1-2 Drop(s) In Eye(s) 4-6 Times Daily     diazepam (VALIUM) 10 MG tablet Take by mouth.     dicyclomine (BENTYL) 10 MG capsule Take 1 capsule (10 mg total) by mouth 4 (four) times daily. 120 capsule 5   Ipratropium-Albuterol (COMBIVENT) 20-100 MCG/ACT AERS respimat Inhale into the lungs.     ipratropium-albuterol (DUONEB) 0.5-2.5 (3) MG/3ML SOLN Inhale into the lungs.     lubiprostone (  AMITIZA) 24 MCG capsule Take 24 mcg by mouth 2 (two) times daily.     Melatonin 5 MG CAPS Take 1 capsule by mouth at bedtime.     morphine (MS CONTIN) 30 MG 12 hr tablet Take 1 tablet (30 mg total) by mouth 2 (two) times daily. 60 tablet 0   pregabalin (LYRICA) 50 MG capsule Take 50 mg by mouth at bedtime.     Spacer/Aero-Holding Chambers (EASIVENT) inhaler See admin instructions.     TRIUMEQ 600-50-300 MG tablet Take 1 tablet by mouth daily.     No current facility-administered medications for this visit.    REVIEW OF SYSTEMS:  Const: negative fever, negative chills, fluctuating weight- when he was taking care of his mother he woeigher 129 pounds- After she passe daway he was 180. He also wa son marinol for PTSD. Now he is between 166-170 Eyes: negative diplopia or visual changes, negative eye pain ENT: negative  coryza, negative sore throat Resp: has copd but says it does not bother him much-  Cards: negative for chest pain, palpitations, lower extremity edema GU: negative for frequency, dysuria and hematuria Skin: negative for rash and pruritus Heme: negative for easy bruising and gum/nose bleeding MS: back pain Neurolo:negative for headaches, dizziness, vertigo, memory problems  Psych: PTSD  Objective:  VITALS:  BP 125/76   Pulse 88   Temp (!) 97.5 F (36.4 C) (Temporal)   Resp 16   Ht 5' 10.5" (1.791 m)   Wt 166 lb 3.2 oz (75.4 kg)   SpO2 93%   BMI 23.51 kg/m  PHYSICAL EXAM:  General: Alert, cooperative, no distress, appears stated age.  Head: Normocephalic, without obvious abnormality, atraumatic. Eyes: Conjunctivae clear, anicteric sclerae. Pupils are equal Nose: Nares normal. No drainage or sinus tenderness. Throat: Lips, mucosa, and tongue normal. No Thrush Neck: Supple, symmetrical, no adenopathy, thyroid: non tender no carotid bruit and no JVD. Back: No CVA tenderness. Lungs: Clear to auscultation bilaterally. No Wheezing or Rhonchi. No rales. Heart: Regular rate and rhythm, no murmur, rub or gallop. Abdomen: Soft, non-tender,not distended. Bowel sounds normal. No masses Extremities: Extremities normal, atraumatic, no cyanosis. No edema. No clubbing Skin: dry skin Lymph: Cervical, supraclavicular normal. Neurologic: Grossly non-focal Pertinent Labs {NA  IMAGING RESULTS: Health maintenance Vaccination  Vaccine Date last given comment  Influenza    Hepatitis B    Hepatitis A    Prevnar-PCV-13    Pneumovac-PPSV-23    TdaP    HPV    Shingrix ( zoster vaccine)     ______________________  Labs Lab Result  Date comment  HIV VL     CD4     Genotype     [id number]     HIV antibody     RPR     Quantiferon Gold     Hep C ab     Hepatitis B-ab,ag,c     Hepatitis A-IgM, IgG /T     Lipid     GC/CHL     PAP     HB,PLT,Cr, LFT       Preventive  Procedure  Result  Date comment  colonoscopy          Dental exam     Opthal       Impression/Recommendation ? ?HIV disease- as per patient he is well controlled- on triumeq which is a fixed drug combination of abacavir, 3TC, dolutegravir. Will get VL and cd4 Will check genosure archive to see any mutation M 184 V- if none we  may be able to switch to 2 drug combo Will get all medical records from [place] [facility] Has no sexual partners in 15 years   HTN on amlodipine- has not been taking it for the past 2 weeks- BP is okay- he says he checks BP every day  Treated Hepatits C- will check RNA today to confirm SVR  Current smoker- had quit twice before for a prolonged period- says he is reducing the number of cigarettes and using patch COPD- on inhalers, not on  home oxygen followed by Dr.Aleskerov  LBA- on MS contin ? ___________________________________________________ Spent 60 min during this visit  gathering clinical data, discussing with patient  regarding the management plan Labs will be done today and follow up in 1 month

## 2021-06-21 NOTE — Patient Instructions (Signed)
You are here to engage in care- transferring from [facility]. Today will do labs- you are on triumeq- will do [employer] and see whether you can qualify for Dovato if no mutations

## 2021-06-22 DIAGNOSIS — G894 Chronic pain syndrome: Secondary | ICD-10-CM | POA: Diagnosis not present

## 2021-06-22 DIAGNOSIS — M150 Primary generalized (osteo)arthritis: Secondary | ICD-10-CM | POA: Diagnosis not present

## 2021-06-22 DIAGNOSIS — M542 Cervicalgia: Secondary | ICD-10-CM | POA: Diagnosis not present

## 2021-06-22 DIAGNOSIS — M545 Low back pain, unspecified: Secondary | ICD-10-CM | POA: Diagnosis not present

## 2021-06-22 DIAGNOSIS — M546 Pain in thoracic spine: Secondary | ICD-10-CM | POA: Diagnosis not present

## 2021-06-22 DIAGNOSIS — G8929 Other chronic pain: Secondary | ICD-10-CM | POA: Diagnosis not present

## 2021-06-22 LAB — T-HELPER CELLS CD4/CD8 %
% CD 4 Pos. Lymph.: 35.1 % (ref 30.8–58.5)
Absolute CD 4 Helper: 527 /uL (ref 359–1519)
Basophils Absolute: 0.1 10*3/uL (ref 0.0–0.2)
Basos: 1 %
CD3+CD4+ Cells/CD3+CD8+ Cells Bld: 0.79 — ABNORMAL LOW (ref 0.92–3.72)
CD3+CD8+ Cells # Bld: 665 /uL (ref 109–897)
CD3+CD8+ Cells NFr Bld: 44.3 % — ABNORMAL HIGH (ref 12.0–35.5)
EOS (ABSOLUTE): 0.1 10*3/uL (ref 0.0–0.4)
Eos: 2 %
Hematocrit: 45.6 % (ref 37.5–51.0)
Hemoglobin: 16.5 g/dL (ref 13.0–17.7)
Immature Grans (Abs): 0 10*3/uL (ref 0.0–0.1)
Immature Granulocytes: 0 %
Lymphocytes Absolute: 1.5 10*3/uL (ref 0.7–3.1)
Lymphs: 20 %
MCH: 37 pg — ABNORMAL HIGH (ref 26.6–33.0)
MCHC: 36.2 g/dL — ABNORMAL HIGH (ref 31.5–35.7)
MCV: 102 fL — ABNORMAL HIGH (ref 79–97)
Monocytes Absolute: 0.5 10*3/uL (ref 0.1–0.9)
Monocytes: 7 %
Neutrophils Absolute: 5.1 10*3/uL (ref 1.4–7.0)
Neutrophils: 70 %
Platelets: 253 10*3/uL (ref 150–450)
RBC: 4.46 x10E6/uL (ref 4.14–5.80)
RDW: 11.7 % (ref 11.6–15.4)
WBC: 7.3 10*3/uL (ref 3.4–10.8)

## 2021-06-22 LAB — HIV-1 RNA QUANT-NO REFLEX-BLD
HIV 1 RNA Quant: <20 copies/mL
LOG10 HIV-1 RNA: UNDETERMINED log10copy/mL

## 2021-06-22 LAB — HEPATITIS B SURFACE ANTIBODY, QUANTITATIVE: Hep B S AB Quant (Post): 12.7 m[IU]/mL (ref >9.9)

## 2021-06-22 LAB — RPR: RPR Ser Ql: NONREACTIVE

## 2021-06-24 ENCOUNTER — Other Ambulatory Visit: Payer: Self-pay | Admitting: Family

## 2021-06-24 ENCOUNTER — Ambulatory Visit
Admission: RE | Admit: 2021-06-24 | Discharge: 2021-06-24 | Disposition: A | Discharge status: Home / Self Care | Payer: Medicare Other | Source: Ambulatory Visit | Attending: Family | Admitting: Family

## 2021-06-24 ENCOUNTER — Ambulatory Visit: Payer: Medicare Other

## 2021-06-24 ENCOUNTER — Other Ambulatory Visit: Payer: Self-pay

## 2021-06-24 DIAGNOSIS — N442 Benign cyst of testis: Secondary | ICD-10-CM | POA: Diagnosis not present

## 2021-06-24 DIAGNOSIS — K4020 Bilateral inguinal hernia, without obstruction or gangrene, not specified as recurrent: Secondary | ICD-10-CM | POA: Diagnosis not present

## 2021-06-25 LAB — QUANTIFERON-TB GOLD PLUS (RQFGPL)
QuantiFERON Mitogen Value: >10 IU/mL
QuantiFERON Nil Value: 0.03 IU/mL
QuantiFERON TB1 Ag Value: 0.03 IU/mL
QuantiFERON TB2 Ag Value: 0.05 IU/mL

## 2021-06-25 LAB — QUANTIFERON-TB GOLD PLUS: QuantiFERON-TB Gold Plus: NEGATIVE

## 2021-06-28 ENCOUNTER — Telehealth: Payer: Self-pay

## 2021-06-28 NOTE — Telephone Encounter (Signed)
-----   Message from Lazarus Gowda, [place] sent at 06/28/2021  2:29 PM EDT ----- Requested records from [place] at [facility].  ----- Message ----- From: Tsosie Billing, MD Sent: 06/21/2021   3:04 PM EDT To: Lazarus Gowda, CMA  Need all records from [facility] vaccination status, labs , meds etc. thx

## 2021-07-11 DIAGNOSIS — Z79891 Long term (current) use of opiate analgesic: Secondary | ICD-10-CM | POA: Diagnosis not present

## 2021-07-11 DIAGNOSIS — M546 Pain in thoracic spine: Secondary | ICD-10-CM | POA: Diagnosis not present

## 2021-07-11 DIAGNOSIS — M150 Primary generalized (osteo)arthritis: Secondary | ICD-10-CM | POA: Diagnosis not present

## 2021-07-11 DIAGNOSIS — G894 Chronic pain syndrome: Secondary | ICD-10-CM | POA: Diagnosis not present

## 2021-07-11 DIAGNOSIS — M542 Cervicalgia: Secondary | ICD-10-CM | POA: Diagnosis not present

## 2021-07-11 DIAGNOSIS — M545 Low back pain, unspecified: Secondary | ICD-10-CM | POA: Diagnosis not present

## 2021-07-21 ENCOUNTER — Other Ambulatory Visit
Admission: RE | Admit: 2021-07-21 | Discharge: 2021-07-21 | Disposition: A | Discharge status: Home / Self Care | Payer: Medicare Other | Source: Ambulatory Visit | Attending: Infectious Diseases | Admitting: Infectious Diseases

## 2021-07-21 ENCOUNTER — Ambulatory Visit: Payer: Medicare Other | Attending: Infectious Diseases | Admitting: Infectious Diseases

## 2021-07-21 ENCOUNTER — Other Ambulatory Visit: Payer: Self-pay

## 2021-07-21 VITALS — BP 143/79 | HR 82 | Resp 16 | Ht 70.5 in | Wt 166.2 lb

## 2021-07-21 DIAGNOSIS — F17210 Nicotine dependence, cigarettes, uncomplicated: Secondary | ICD-10-CM | POA: Diagnosis not present

## 2021-07-21 DIAGNOSIS — B20 Human immunodeficiency virus [HIV] disease: Secondary | ICD-10-CM | POA: Insufficient documentation

## 2021-07-21 DIAGNOSIS — E785 Hyperlipidemia, unspecified: Secondary | ICD-10-CM | POA: Diagnosis not present

## 2021-07-21 DIAGNOSIS — J449 Chronic obstructive pulmonary disease, unspecified: Secondary | ICD-10-CM | POA: Insufficient documentation

## 2021-07-21 DIAGNOSIS — R5382 Chronic fatigue, unspecified: Secondary | ICD-10-CM | POA: Insufficient documentation

## 2021-07-21 DIAGNOSIS — R5383 Other fatigue: Secondary | ICD-10-CM | POA: Diagnosis not present

## 2021-07-21 DIAGNOSIS — R351 Nocturia: Secondary | ICD-10-CM | POA: Diagnosis not present

## 2021-07-21 DIAGNOSIS — G8929 Other chronic pain: Secondary | ICD-10-CM | POA: Diagnosis not present

## 2021-07-21 DIAGNOSIS — Z7951 Long term (current) use of inhaled steroids: Secondary | ICD-10-CM | POA: Insufficient documentation

## 2021-07-21 DIAGNOSIS — R7989 Other specified abnormal findings of blood chemistry: Secondary | ICD-10-CM | POA: Diagnosis not present

## 2021-07-21 DIAGNOSIS — I10 Essential (primary) hypertension: Secondary | ICD-10-CM | POA: Diagnosis not present

## 2021-07-21 DIAGNOSIS — B182 Chronic viral hepatitis C: Secondary | ICD-10-CM | POA: Diagnosis not present

## 2021-07-21 DIAGNOSIS — Z79891 Long term (current) use of opiate analgesic: Secondary | ICD-10-CM | POA: Insufficient documentation

## 2021-07-21 DIAGNOSIS — M549 Dorsalgia, unspecified: Secondary | ICD-10-CM | POA: Insufficient documentation

## 2021-07-21 DIAGNOSIS — N401 Enlarged prostate with lower urinary tract symptoms: Secondary | ICD-10-CM

## 2021-07-21 LAB — TSH: TSH: 2.517 u[IU]/mL (ref 0.350–4.500)

## 2021-07-21 NOTE — Patient Instructions (Signed)
Today we will do some blood work to look into your fatigue- will refer you to urology  for frequent urination at night time. Will follow up in 4 months

## 2021-07-21 NOTE — Progress Notes (Signed)
NAME: Albert Wong  DOB: 10/14/1956  MRN: [id number]  Date/Time: 07/21/2021 10:58 AM   Subjective:  Pt is here for follow up  Last seen on 06/21/21 That was his first visit with me as he was transferring his care to me from [facility] Here to discuss his labs He says he has been tired, fatigued , low energy for a few months Says he had low testosterone and used to get shots from his PCP. He also gets up many times at night to go to micturate Pt says he has good appetite Last Vl < 20 Cd4  526 100% adherent to triumeq ? Albert Wong is a 65 y.o. with a history of HIV disease diagnose din early 66s , Treated hepatitis C , COPD, current smoker, on MS contin for back pain ( pain management by Dr.Montique  Humphrey Rolls), HTN, hyperlipidemia  HIV diagnosed  early 65s. says it was a routine blood test, - was in care at [facility] for many years and then went to see Albert Wong in 2014-2019, then [facility]  Nadir Cd4 DK- on  08/14/1991 was 440 VL  OI -None  HAARt history First regimen  Azt based Atripla Now on Triumeq 100% adherent to HAART- says last Vl < 20  Acquired thru sex with men Not sexually active in 15 years he says Genotype unknown ? Past Medical History:  Diagnosis Date   Anxiety    Chronic nausea    Degenerative disk disease    HIV (human immunodeficiency virus infection) ([place])    Insomnia    Muscle spasm    Rotator cuff tear     No past surgical history on file.  Social History   Socioeconomic History   Marital status: Single    Spouse name: Not on file   Number of children: Not on file   Years of education: Not on file   Highest education level: Not on file  Occupational History   Not on file  Tobacco Use   Smoking status: Every Day    Packs/day: 1.00    Years: 39.00    Pack years: 39.00    Types: Cigarettes   Smokeless tobacco: Never   Tobacco comments:    Wears patches. Smokes about 6 cigarettes a week.  Substance and Sexual Activity   Alcohol use: Yes   Drug use: No    Sexual activity: Not on file  Other Topics Concern   Not on file  Social History Narrative   Not on file   Social Determinants of Health   Financial Resource Strain: Not on file  Food Insecurity: Not on file  Transportation Needs: Not on file  Physical Activity: Not on file  Stress: Not on file  Social Connections: Not on file  Intimate Partner Violence: Not on file    Family History  Problem Relation Age of Onset   Heart disease Father   Mother breast cancer No Known Allergies ? Current Outpatient Medications  Medication Sig Dispense Refill   amLODipine (NORVASC) 5 MG tablet Take by mouth.     atorvastatin (LIPITOR) 20 MG tablet SMARTSIG:1 Tablet(s) By Mouth Every Evening     B-D 3CC LUER-LOK SYR 21GX1" 21G X 1" 3 ML MISC USE AS DIRECTED WITH TESTOSTERONE 24 each 6   baclofen (LIORESAL) 10 MG tablet TAKE 1 TABLET BY MOUTH EVERY DAY 30 tablet 11   Budeson-Glycopyrrol-Formoterol (BREZTRI AEROSPHERE) 160-9-4.8 MCG/ACT AERO INHALE 2 INHALATIONS INTO THE LUNGS TWICE DAILY     cromolyn (OPTICROM) 4 %  ophthalmic solution SMARTSIG:1-2 Drop(s) In Eye(s) 4-6 Times Daily     diazepam (VALIUM) 10 MG tablet Take by mouth.     dicyclomine (BENTYL) 10 MG capsule Take 1 capsule (10 mg total) by mouth 4 (four) times daily. 120 capsule 5   Ipratropium-Albuterol (COMBIVENT) 20-100 MCG/ACT AERS respimat Inhale into the lungs.     ipratropium-albuterol (DUONEB) 0.5-2.5 (3) MG/3ML SOLN Inhale into the lungs.     lubiprostone (AMITIZA) 24 MCG capsule Take 24 mcg by mouth 2 (two) times daily.     Melatonin 5 MG CAPS Take 1 capsule by mouth at bedtime.     morphine (MS CONTIN) 30 MG 12 hr tablet Take 1 tablet (30 mg total) by mouth 2 (two) times daily. 60 tablet 0   pregabalin (LYRICA) 50 MG capsule Take 50 mg by mouth at bedtime.     TRIUMEQ 600-50-300 MG tablet Take 1 tablet by mouth daily.     No current facility-administered medications for this visit.    REVIEW OF SYSTEMS:  Const: negative  fever, negative chills, fluctuating weight- when he was taking care of his mother he woeigher 129 pounds- After she passe [place]way he was 180. He also wa son marinol for PTSD. Now he is between 166-170 Eyes: negative diplopia or visual changes, negative eye pain ENT: negative coryza, negative sore throat Resp: has copd but says it does not bother him much-  Cards: negative for chest pain, palpitations, lower extremity edema GU: negative for frequency, dysuria and hematuria Skin: negative for rash and pruritus Heme: negative for easy bruising and gum/nose bleeding MS: back pain Neurolo:negative for headaches, dizziness, vertigo, memory problems  Psych: PTSD  Objective:  VITALS:  BP (!) 143/79   Pulse 82   Resp 16   Ht 5' 10.5" (1.791 m)   Wt 166 lb 3.2 oz (75.4 kg)   SpO2 91%   BMI 23.51 kg/m  PHYSICAL EXAM:  General: Alert, cooperative, no distress, appears stated age.  Head: Normocephalic, without obvious abnormality, atraumatic. Eyes: Conjunctivae clear, anicteric sclerae. Pupils are equal Nose: Nares normal. No drainage or sinus tenderness. Throat: Lips, mucosa, and tongue normal. No Thrush Neck: Supple, symmetrical, no adenopathy, thyroid: non tender no carotid bruit and no JVD. Back: No CVA tenderness. Lungs: Clear to auscultation bilaterally. No Wheezing or Rhonchi. No rales. Heart: Regular rate and rhythm, no murmur, rub or gallop. Abdomen: Soft, non-tender,not distended. Bowel sounds normal. No masses Extremities: Extremities normal, atraumatic, no cyanosis. No edema. No clubbing Skin: dry skin Lymph: Cervical, supraclavicular normal. Neurologic: Grossly non-focal Pertinent Labs {NA  IMAGING RESULTS: Health maintenance Vaccination  Vaccine Date last given comment  Influenza    Hepatitis B Has antibodies   Hepatitis A    Prevnar-PCV-13 ?   Pneumovac-PPSV-23 2020   TdaP 2021   HPV    Shingrix ( zoster vaccine)     ______________________  Labs Lab Result   Date comment  HIV VL <20 06/21/21   CD4 527 06/21/21   Genotype     [id number] NR    HIV antibody     RPR NR    Quantiferon Gold Negative    Hep C ab reactive    Hepatitis B-ab,ag,c Sab > 12.7    Hepatitis A-IgM, IgG /T Total-pos    Lipid     GC/CHL          HB,PLT,Cr, LFT       Preventive  Procedure Result  Date comment  colonoscopy  2018  Dental exam     Opthal       Impression/Recommendation ? ?HIV disease- he is well controlled- Vl < 20 and cd4 is 527 on triumeq which is a fixed drug combination of abacavir, 3TC, dolutegravir. Genosure archive was not done last visit eventhough it was ordered. Will get one day- if no resisatnce we may be able to do 2 drug combo- Dovato  Fatigue- check TSH and testosterone  Had low testosterone before  Nocturia- refer to urology  HTN on amlodipine- has not been taking it for the past 2 weeks- BP is okay- he says he checks BP every day  Treated Hepatits C- will check RNA today to confirm SVR  Current smoker- had quit twice before for a prolonged period- says he is reducing the number of cigarettes and using patch  COPD- on inhalers, not on  home oxygen followed by Dr.Aleskerov  LBA- on MS contin ? __need to get complete vaccination records_________________________________________________ Follow up 4 months or earlier

## 2021-07-22 LAB — TESTOSTERONE: Testosterone: 321 ng/dL (ref 264–916)

## 2021-07-23 LAB — HCV RNA QUANT: HCV Quantitative: NOT DETECTED IU/mL (ref >50)

## 2021-07-25 LAB — TESTOSTERONE, FREE: Testosterone, Free: 1.5 pg/mL — ABNORMAL LOW (ref 6.6–18.1)

## 2021-07-27 ENCOUNTER — Ambulatory Visit (INDEPENDENT_AMBULATORY_CARE_PROVIDER_SITE_OTHER): Payer: Medicare Other | Admitting: Urology

## 2021-07-27 ENCOUNTER — Encounter: Payer: Self-pay | Admitting: Urology

## 2021-07-27 ENCOUNTER — Other Ambulatory Visit: Payer: Self-pay

## 2021-07-27 VITALS — BP 142/75 | HR 107 | Ht 70.0 in | Wt 166.0 lb

## 2021-07-27 DIAGNOSIS — R351 Nocturia: Secondary | ICD-10-CM

## 2021-07-27 DIAGNOSIS — N401 Enlarged prostate with lower urinary tract symptoms: Secondary | ICD-10-CM | POA: Diagnosis not present

## 2021-07-27 DIAGNOSIS — R350 Frequency of micturition: Secondary | ICD-10-CM

## 2021-07-27 LAB — MICROSCOPIC EXAMINATION
Bacteria, UA: NONE SEEN
RBC, Urine: NONE SEEN /hpf (ref 0–2)

## 2021-07-27 LAB — URINALYSIS, COMPLETE
Bilirubin, UA: NEGATIVE
Glucose, UA: NEGATIVE
Ketones, UA: NEGATIVE
Leukocytes,UA: NEGATIVE
Nitrite, UA: NEGATIVE
Protein,UA: NEGATIVE
RBC, UA: NEGATIVE
Specific Gravity, UA: 1.02 (ref 1.005–1.030)
Urobilinogen, Ur: 0.2 mg/dL (ref 0.2–1.0)
pH, UA: 5.5 (ref 5.0–7.5)

## 2021-07-27 LAB — BLADDER SCAN AMB NON-IMAGING: Scan Result: 15

## 2021-07-27 MED ORDER — TAMSULOSIN HCL 0.4 MG PO CAPS: 0.4000 mg | ORAL_CAPSULE | Freq: Every day | ORAL | 0 refills | Status: DC

## 2021-07-27 NOTE — Progress Notes (Signed)
07/27/2021 2:52 PM   Danial K Reisig [birth year] [id number]  Referring provider: Jodi Marble, MD [place],  [place] [address]  Chief Complaint  Patient presents with   Other    HPI: Michaeal Reichert is a 65 y.o. male referred for evaluation of BPH with lower urinary tract symptoms.  Complains of a several year history of urinary frequency, hesitancy, weak stream and nocturia x5-7 IPSS completed today 20/35 Denies dysuria, gross hematuria Has chronic bilateral low back pain and states history of ruptured disks Nighttime voided urine volumes are variable from "a few drops" to increased urine volume   PMH: Past Medical History:  Diagnosis Date   Anxiety    Chronic nausea    Degenerative disk disease    HIV (human immunodeficiency virus infection) ([place])    Insomnia    Muscle spasm    Rotator cuff tear     Surgical History: No past surgical history on file.  Home Medications:  Allergies as of 07/27/2021   No Known Allergies      Medication List        Accurate as of July 27, 2021  2:52 PM. If you have any questions, ask your nurse or doctor.          amLODipine 5 MG tablet Commonly known as: NORVASC Take by mouth.   atorvastatin 20 MG tablet Commonly known as: LIPITOR SMARTSIG:1 Tablet(s) By Mouth Every Evening   B-D 3CC LUER-LOK SYR 21GX1" 21G X 1" 3 ML Misc Generic drug: SYRINGE-NEEDLE (DISP) 3 ML USE AS DIRECTED WITH TESTOSTERONE   baclofen 10 MG tablet Commonly known as: LIORESAL TAKE 1 TABLET BY MOUTH EVERY DAY   Breztri Aerosphere 160-9-4.8 MCG/ACT Aero Generic drug: Budeson-Glycopyrrol-Formoterol INHALE 2 INHALATIONS INTO THE LUNGS TWICE DAILY   cromolyn 4 % ophthalmic solution Commonly known as: OPTICROM SMARTSIG:1-2 Drop(s) In Eye(s) 4-6 Times Daily   diazepam 10 MG tablet Commonly known as: VALIUM Take by mouth.   dicyclomine 10 MG capsule Commonly known as: Bentyl Take 1 capsule (10 mg total) by mouth 4 (four) times  daily.   Ipratropium-Albuterol 20-100 MCG/ACT Aers respimat Commonly known as: COMBIVENT Inhale into the lungs.   ipratropium-albuterol 0.5-2.5 (3) MG/3ML Soln Commonly known as: DUONEB Inhale into the lungs.   lubiprostone 24 MCG capsule Commonly known as: AMITIZA Take 24 mcg by mouth 2 (two) times daily.   Melatonin 5 MG Caps Take 1 capsule by mouth at bedtime.   morphine 30 MG 12 hr tablet Commonly known as: MS CONTIN Take 1 tablet (30 mg total) by mouth 2 (two) times daily.   pregabalin 50 MG capsule Commonly known as: LYRICA Take 50 mg by mouth at bedtime.   Triumeq 600-50-300 MG tablet Generic drug: abacavir-dolutegravir-lamiVUDine Take 1 tablet by mouth daily.        Allergies: No Known Allergies  Family History: Family History  Problem Relation Age of Onset   Heart disease Father     Social History:  reports that he has been smoking cigarettes. He has a 39.00 pack-year smoking history. He has never used smokeless tobacco. He reports current alcohol use. He reports that he does not use drugs.   Physical Exam: BP (!) 142/75   Pulse (!) 107   Ht '5\' 10"'$  (1.778 m)   Wt 166 lb (75.3 kg)   BMI 23.82 kg/m   Constitutional:  Alert and oriented, No acute distress. HEENT: [place] AT, moist mucus membranes.  Trachea midline, no masses. Cardiovascular: No  clubbing, cyanosis, or edema. Respiratory: Normal respiratory effort, no increased work of breathing. GI: Abdomen is soft, nontender, nondistended, no abdominal masses GU: Prostate 50 g, flat smooth without nodules Skin: No rashes, bruises or suspicious lesions. Neurologic: Grossly intact, no focal deficits, moving all 4 extremities. Psychiatric: Normal mood and affect.  Laboratory Data:  Urinalysis Dipstick/microscopy negative   Assessment & Plan:    1.  BPH with LUTS Severe lower urinary tract symptoms Bladder scan PVR 15 mL Recommended trial tamsulosin 0.4 mg daily PA follow-up 1 month for symptom  reassessment  2.  Nocturia We discussed the multiple potential etiologies of nocturia including BPH, bladder overactivity, sleep apnea and nocturnal polyuria If no improvement in his nocturia on tamsulosin could try a short acting anticholinergic at bedtime and if no improvement would recommend sleep study   Abbie Sons, MD  [facility] [address] [place], [place] [address] [phone]

## 2021-07-28 ENCOUNTER — Encounter: Payer: Self-pay | Admitting: Urology

## 2021-08-08 DIAGNOSIS — Z79899 Other long term (current) drug therapy: Secondary | ICD-10-CM | POA: Diagnosis not present

## 2021-08-08 DIAGNOSIS — M150 Primary generalized (osteo)arthritis: Secondary | ICD-10-CM | POA: Diagnosis not present

## 2021-08-08 DIAGNOSIS — G8929 Other chronic pain: Secondary | ICD-10-CM | POA: Diagnosis not present

## 2021-08-08 DIAGNOSIS — M546 Pain in thoracic spine: Secondary | ICD-10-CM | POA: Diagnosis not present

## 2021-08-08 DIAGNOSIS — M545 Low back pain, unspecified: Secondary | ICD-10-CM | POA: Diagnosis not present

## 2021-08-08 DIAGNOSIS — M542 Cervicalgia: Secondary | ICD-10-CM | POA: Diagnosis not present

## 2021-08-11 DIAGNOSIS — E785 Hyperlipidemia, unspecified: Secondary | ICD-10-CM | POA: Diagnosis not present

## 2021-08-11 DIAGNOSIS — E559 Vitamin D deficiency, unspecified: Secondary | ICD-10-CM | POA: Diagnosis not present

## 2021-08-15 ENCOUNTER — Telehealth: Payer: Self-pay

## 2021-08-15 DIAGNOSIS — R110 Nausea: Secondary | ICD-10-CM | POA: Diagnosis not present

## 2021-08-15 DIAGNOSIS — J449 Chronic obstructive pulmonary disease, unspecified: Secondary | ICD-10-CM | POA: Diagnosis not present

## 2021-08-15 DIAGNOSIS — G8929 Other chronic pain: Secondary | ICD-10-CM | POA: Diagnosis not present

## 2021-08-15 DIAGNOSIS — I10 Essential (primary) hypertension: Secondary | ICD-10-CM | POA: Diagnosis not present

## 2021-08-15 DIAGNOSIS — E785 Hyperlipidemia, unspecified: Secondary | ICD-10-CM | POA: Diagnosis not present

## 2021-08-15 DIAGNOSIS — J301 Allergic rhinitis due to pollen: Secondary | ICD-10-CM | POA: Diagnosis not present

## 2021-08-15 DIAGNOSIS — E559 Vitamin D deficiency, unspecified: Secondary | ICD-10-CM | POA: Diagnosis not present

## 2021-08-15 DIAGNOSIS — L240 Irritant contact dermatitis due to detergents: Secondary | ICD-10-CM | POA: Diagnosis not present

## 2021-08-15 DIAGNOSIS — Z23 Encounter for immunization: Secondary | ICD-10-CM | POA: Diagnosis not present

## 2021-08-15 LAB — MISC LABCORP TEST (SEND OUT): Labcorp test code: 551776

## 2021-08-15 NOTE — Telephone Encounter (Signed)
Patient left voicemail requesting refill for Triumeq. Will route to provider.   Beryle Flock, RN

## 2021-08-16 ENCOUNTER — Other Ambulatory Visit: Payer: Self-pay | Admitting: Infectious Diseases

## 2021-08-16 MED ORDER — TRIUMEQ 600-50-300 MG PO TABS: 1.0000 | ORAL_TABLET | Freq: Every day | ORAL | 5 refills | Status: DC

## 2021-09-01 ENCOUNTER — Encounter: Payer: Self-pay | Admitting: Urology

## 2021-09-01 ENCOUNTER — Other Ambulatory Visit: Payer: Self-pay

## 2021-09-01 ENCOUNTER — Ambulatory Visit (INDEPENDENT_AMBULATORY_CARE_PROVIDER_SITE_OTHER): Payer: Medicare Other | Admitting: Urology

## 2021-09-01 VITALS — BP 110/72 | HR 91 | Ht 70.0 in | Wt 166.0 lb

## 2021-09-01 DIAGNOSIS — N401 Enlarged prostate with lower urinary tract symptoms: Secondary | ICD-10-CM

## 2021-09-01 DIAGNOSIS — R351 Nocturia: Secondary | ICD-10-CM | POA: Diagnosis not present

## 2021-09-01 DIAGNOSIS — R350 Frequency of micturition: Secondary | ICD-10-CM | POA: Diagnosis not present

## 2021-09-01 NOTE — Progress Notes (Signed)
09/01/2021 1:01 PM   Albert Wong 08-18-1956 [id number]  Referring provider: Jodi Marble, MD [place],  [place] [address]  Chief Complaint  Patient presents with   Follow-up    HPI: 65 y.o. male presents for follow-up of lower urinary tract symptoms.  See my prior note 07/27/2021 Noted improvement in his symptoms on tamsulosin with less urinary frequency, decreased nocturia and resolution of his postvoid dribbling He presently has no bothersome lower urinary tract symptoms   PMH: Past Medical History:  Diagnosis Date   Anxiety    Chronic nausea    Degenerative disk disease    HIV (human immunodeficiency virus infection) ([place])    Insomnia    Muscle spasm    Rotator cuff tear     Surgical History: No past surgical history on file.  Home Medications:  Allergies as of 09/01/2021   No Known Allergies      Medication List        Accurate as of September 01, 2021  1:01 PM. If you have any questions, ask your nurse or doctor.          STOP taking these medications    atorvastatin 20 MG tablet Commonly known as: LIPITOR Stopped by: Abbie Sons, MD   Judithann Sauger Aerosphere 160-9-4.8 MCG/ACT Aero Generic drug: Budeson-Glycopyrrol-Formoterol Stopped by: Abbie Sons, MD       TAKE these medications    amLODipine 5 MG tablet Commonly known as: NORVASC Take by mouth.   B-D 3CC LUER-LOK SYR 21GX1" 21G X 1" 3 ML Misc Generic drug: SYRINGE-NEEDLE (DISP) 3 ML USE AS DIRECTED WITH TESTOSTERONE   baclofen 10 MG tablet Commonly known as: LIORESAL TAKE 1 TABLET BY MOUTH EVERY DAY   cromolyn 4 % ophthalmic solution Commonly known as: OPTICROM SMARTSIG:1-2 Drop(s) In Eye(s) 4-6 Times Daily   diazepam 10 MG tablet Commonly known as: VALIUM Take by mouth.   dicyclomine 20 MG tablet Commonly known as: BENTYL Take 20 mg by mouth 4 (four) times daily. What changed: Another medication with the same name was removed. Continue taking this  medication, and follow the directions you see here. Changed by: Abbie Sons, MD   Ipratropium-Albuterol 20-100 MCG/ACT Aers respimat Commonly known as: COMBIVENT Inhale into the lungs. What changed: Another medication with the same name was removed. Continue taking this medication, and follow the directions you see here. Changed by: Abbie Sons, MD   lubiprostone 24 MCG capsule Commonly known as: AMITIZA Take 24 mcg by mouth 2 (two) times daily.   Melatonin 5 MG Caps Take 1 capsule by mouth at bedtime.   morphine 30 MG 12 hr tablet Commonly known as: MS CONTIN Take 1 tablet (30 mg total) by mouth 2 (two) times daily.   pregabalin 50 MG capsule Commonly known as: LYRICA Take 50 mg by mouth at bedtime.   rosuvastatin 20 MG tablet Commonly known as: CRESTOR SMARTSIG:1 Tablet(s) By Mouth Every Evening   Symbicort 80-4.5 MCG/ACT inhaler Generic drug: budesonide-formoterol Inhale 2 puffs into the lungs 2 (two) times daily.   tamsulosin 0.4 MG Caps capsule Commonly known as: FLOMAX Take 1 capsule (0.4 mg total) by mouth daily.   triamcinolone cream 0.1 % Commonly known as: KENALOG Apply topically 2 (two) times daily.   Triumeq 600-50-300 MG tablet Generic drug: abacavir-dolutegravir-lamiVUDine Take 1 tablet by mouth daily.        Allergies: No Known Allergies  Family History: Family History  Problem Relation Age of  Onset   Heart disease Father     Social History:  reports that he has been smoking cigarettes. He has a 39.00 pack-year smoking history. He has never used smokeless tobacco. He reports current alcohol use. He reports that he does not use drugs.   Physical Exam: BP 110/72   Pulse 91   Ht '5\' 10"'$  (1.778 m)   Wt 166 lb (75.3 kg)   BMI 23.82 kg/m   Constitutional:  Alert and oriented, No acute distress. HEENT: [place] AT, moist mucus membranes.  Trachea midline, no masses. Cardiovascular: No clubbing, cyanosis, or edema. Respiratory: Normal  respiratory effort, no increased work of breathing.   Assessment & Plan:    1.  BPH with LUTS Significant improvement on tamsulosin I offered to send in refills however he wanted to finish his current 1 month supply and see how his symptoms were doing.  We discussed it is likely he will have recurrent symptoms off the medication and was instructed to call for refills if he desire 1 year follow-up scheduled   Abbie Sons, MD  [facility] [address], [place], [place] [address] [phone]

## 2021-09-02 ENCOUNTER — Other Ambulatory Visit: Payer: Self-pay | Admitting: Urology

## 2021-09-05 DIAGNOSIS — G894 Chronic pain syndrome: Secondary | ICD-10-CM | POA: Diagnosis not present

## 2021-09-05 DIAGNOSIS — M546 Pain in thoracic spine: Secondary | ICD-10-CM | POA: Diagnosis not present

## 2021-09-05 DIAGNOSIS — Z79891 Long term (current) use of opiate analgesic: Secondary | ICD-10-CM | POA: Diagnosis not present

## 2021-09-05 DIAGNOSIS — M542 Cervicalgia: Secondary | ICD-10-CM | POA: Diagnosis not present

## 2021-09-05 DIAGNOSIS — M150 Primary generalized (osteo)arthritis: Secondary | ICD-10-CM | POA: Diagnosis not present

## 2021-09-05 DIAGNOSIS — Z79899 Other long term (current) drug therapy: Secondary | ICD-10-CM | POA: Diagnosis not present

## 2021-09-05 DIAGNOSIS — M545 Low back pain, unspecified: Secondary | ICD-10-CM | POA: Diagnosis not present

## 2021-09-12 ENCOUNTER — Other Ambulatory Visit (HOSPITAL_COMMUNITY): Payer: Self-pay

## 2021-09-12 ENCOUNTER — Other Ambulatory Visit: Payer: Self-pay | Admitting: Infectious Diseases

## 2021-09-12 ENCOUNTER — Telehealth: Payer: Self-pay

## 2021-09-12 MED ORDER — DOVATO 50-300 MG PO TABS: 1.0000 | ORAL_TABLET | Freq: Every day | ORAL | 3 refills | Status: DC

## 2021-09-12 NOTE — Progress Notes (Signed)
Genosure archive no <184V rmutation- susceptible to all integrase inhibitors- So truimeq  ( epivir, Abacavir and dolutegravir) changed to Dovata ( Epivir+ Dolutegravir). Pt has  undetectable viral load as well

## 2021-09-12 NOTE — Telephone Encounter (Signed)
Rcvd vm for lab results. Per Dr Delaine Lame Geno type labs are good and he can start Dovato. Verified with [name] in pharmacy who determined we can send this to patients local pharmacy of choice and there is a 0$ copay. I have tried to reach him at the number he left [phone] and left vm at the cell listed. Need to verify that he still uses [facility]. Once verified please send secure chat to Dr Delaine Lame and she will send in RX.

## 2021-09-16 ENCOUNTER — Observation Stay
Admission: EM | Admit: 2021-09-16 | Discharge: 2021-09-17 | Disposition: A | Discharge status: Home / Self Care | Payer: Medicare Other | Attending: Internal Medicine | Admitting: Internal Medicine

## 2021-09-16 ENCOUNTER — Emergency Department: Payer: Medicare Other

## 2021-09-16 ENCOUNTER — Encounter
Admission: EM | Disposition: A | Discharge status: Home / Self Care | Payer: Self-pay | Source: Home / Self Care | Attending: Emergency Medicine

## 2021-09-16 ENCOUNTER — Other Ambulatory Visit: Payer: Self-pay

## 2021-09-16 DIAGNOSIS — I10 Essential (primary) hypertension: Secondary | ICD-10-CM | POA: Insufficient documentation

## 2021-09-16 DIAGNOSIS — J449 Chronic obstructive pulmonary disease, unspecified: Secondary | ICD-10-CM | POA: Insufficient documentation

## 2021-09-16 DIAGNOSIS — F419 Anxiety disorder, unspecified: Secondary | ICD-10-CM | POA: Diagnosis not present

## 2021-09-16 DIAGNOSIS — F17210 Nicotine dependence, cigarettes, uncomplicated: Secondary | ICD-10-CM | POA: Insufficient documentation

## 2021-09-16 DIAGNOSIS — B20 Human immunodeficiency virus [HIV] disease: Secondary | ICD-10-CM | POA: Insufficient documentation

## 2021-09-16 DIAGNOSIS — Z8739 Personal history of other diseases of the musculoskeletal system and connective tissue: Secondary | ICD-10-CM

## 2021-09-16 DIAGNOSIS — E785 Hyperlipidemia, unspecified: Secondary | ICD-10-CM | POA: Diagnosis present

## 2021-09-16 DIAGNOSIS — Z20822 Contact with and (suspected) exposure to covid-19: Secondary | ICD-10-CM | POA: Diagnosis not present

## 2021-09-16 DIAGNOSIS — I2510 Atherosclerotic heart disease of native coronary artery without angina pectoris: Secondary | ICD-10-CM | POA: Diagnosis not present

## 2021-09-16 DIAGNOSIS — Z743 Need for continuous supervision: Secondary | ICD-10-CM | POA: Diagnosis not present

## 2021-09-16 DIAGNOSIS — Z79899 Other long term (current) drug therapy: Secondary | ICD-10-CM | POA: Insufficient documentation

## 2021-09-16 DIAGNOSIS — R5381 Other malaise: Secondary | ICD-10-CM | POA: Diagnosis not present

## 2021-09-16 DIAGNOSIS — I214 Non-ST elevation (NSTEMI) myocardial infarction: Secondary | ICD-10-CM | POA: Diagnosis not present

## 2021-09-16 DIAGNOSIS — Z720 Tobacco use: Secondary | ICD-10-CM | POA: Diagnosis present

## 2021-09-16 DIAGNOSIS — R0602 Shortness of breath: Secondary | ICD-10-CM | POA: Diagnosis not present

## 2021-09-16 DIAGNOSIS — R6889 Other general symptoms and signs: Secondary | ICD-10-CM | POA: Diagnosis not present

## 2021-09-16 HISTORY — PX: LEFT HEART CATH AND CORONARY ANGIOGRAPHY: CATH118249

## 2021-09-16 LAB — RESP PANEL BY RT-PCR (FLU A&B, COVID) ARPGX2
Influenza A by PCR: NEGATIVE
Influenza B by PCR: NEGATIVE
SARS Coronavirus 2 by RT PCR: NEGATIVE

## 2021-09-16 LAB — CBC WITH DIFFERENTIAL/PLATELET
Abs Immature Granulocytes: 0.02 10*3/uL (ref 0.00–0.07)
Basophils Absolute: 0.1 10*3/uL (ref 0.0–0.1)
Basophils Relative: 1 %
Eosinophils Absolute: 0.1 10*3/uL (ref 0.0–0.5)
Eosinophils Relative: 2 %
HCT: 46.9 % (ref 39.0–52.0)
Hemoglobin: 16 g/dL (ref 13.0–17.0)
Immature Granulocytes: 0 %
Lymphocytes Relative: 30 %
Lymphs Abs: 2 10*3/uL (ref 0.7–4.0)
MCH: 36.2 pg — ABNORMAL HIGH (ref 26.0–34.0)
MCHC: 34.1 g/dL (ref 30.0–36.0)
MCV: 106.1 fL — ABNORMAL HIGH (ref 80.0–100.0)
Monocytes Absolute: 0.5 10*3/uL (ref 0.1–1.0)
Monocytes Relative: 7 %
Neutro Abs: 4.1 10*3/uL (ref 1.7–7.7)
Neutrophils Relative %: 60 %
Platelets: 220 10*3/uL (ref 150–400)
RBC: 4.42 MIL/uL (ref 4.22–5.81)
RDW: 12.3 % (ref 11.5–15.5)
WBC: 6.7 10*3/uL (ref 4.0–10.5)
nRBC: 0 % (ref 0.0–0.2)

## 2021-09-16 LAB — TROPONIN I (HIGH SENSITIVITY)
Troponin I (High Sensitivity): 30 ng/L — ABNORMAL HIGH (ref <18)
Troponin I (High Sensitivity): 531 ng/L (ref <18)
Troponin I (High Sensitivity): 736 ng/L (ref <18)
Troponin I (High Sensitivity): 580 ng/L (ref <18)
Troponin I (High Sensitivity): 450 ng/L (ref <18)

## 2021-09-16 LAB — COMPREHENSIVE METABOLIC PANEL
ALT: 8 U/L (ref 0–44)
AST: 21 U/L (ref 15–41)
Albumin: 4.1 g/dL (ref 3.5–5.0)
Alkaline Phosphatase: 35 U/L — ABNORMAL LOW (ref 38–126)
Anion gap: 9 (ref 5–15)
BUN: 9 mg/dL (ref 8–23)
CO2: 27 mmol/L (ref 22–32)
Calcium: 8.9 mg/dL (ref 8.9–10.3)
Chloride: 105 mmol/L (ref 98–111)
Creatinine, Ser: 1.08 mg/dL (ref 0.61–1.24)
GFR, Estimated: >60 mL/min (ref >60)
Glucose, Bld: 110 mg/dL — ABNORMAL HIGH (ref 70–99)
Potassium: 3.6 mmol/L (ref 3.5–5.1)
Sodium: 141 mmol/L (ref 135–145)
Total Bilirubin: 0.8 mg/dL (ref 0.3–1.2)
Total Protein: 7.2 g/dL (ref 6.5–8.1)

## 2021-09-16 LAB — PROCALCITONIN: Procalcitonin: 2.3 ng/mL

## 2021-09-16 LAB — PROTIME-INR
INR: 1.1 (ref 0.8–1.2)
Prothrombin Time: 13.7 seconds (ref 11.4–15.2)

## 2021-09-16 LAB — APTT: aPTT: 29 seconds (ref 24–36)

## 2021-09-16 LAB — BRAIN NATRIURETIC PEPTIDE: B Natriuretic Peptide: 48.7 pg/mL (ref 0.0–100.0)

## 2021-09-16 SURGERY — LEFT HEART CATH AND CORONARY ANGIOGRAPHY
Anesthesia: Moderate Sedation

## 2021-09-16 MED ORDER — INFLUENZA VAC A&B SA ADJ QUAD 0.5 ML IM PRSY
0.5000 mL | PREFILLED_SYRINGE | INTRAMUSCULAR | Status: DC
  Filled 2021-09-16: qty 0.5

## 2021-09-16 MED ORDER — ASPIRIN EC 81 MG PO TBEC
81.0000 mg | DELAYED_RELEASE_TABLET | Freq: Every day | ORAL | Status: DC
  Administered 2021-09-17: 81 mg via ORAL
  Filled 2021-09-16: qty 1

## 2021-09-16 MED ORDER — SODIUM CHLORIDE 0.9 % IV SOLN
250.0000 mL | INTRAVENOUS | Status: DC | PRN
Start: 2021-09-16 — End: 2021-09-16

## 2021-09-16 MED ORDER — HEPARIN SODIUM (PORCINE) 1000 UNIT/ML IJ SOLN
INTRAMUSCULAR | Status: AC
  Filled 2021-09-16: qty 1

## 2021-09-16 MED ORDER — TAMSULOSIN HCL 0.4 MG PO CAPS
0.4000 mg | ORAL_CAPSULE | Freq: Every day | ORAL | Status: DC
  Administered 2021-09-16: 0.4 mg via ORAL
  Filled 2021-09-16: qty 1

## 2021-09-16 MED ORDER — ROSUVASTATIN CALCIUM 10 MG PO TABS
20.0000 mg | ORAL_TABLET | Freq: Every day | ORAL | Status: DC
  Administered 2021-09-16: 20 mg via ORAL
  Filled 2021-09-16: qty 2

## 2021-09-16 MED ORDER — AMLODIPINE BESYLATE 5 MG PO TABS
5.0000 mg | ORAL_TABLET | Freq: Every day | ORAL | Status: DC
  Administered 2021-09-16: 5 mg via ORAL
  Filled 2021-09-16: qty 1

## 2021-09-16 MED ORDER — HEPARIN BOLUS VIA INFUSION
4000.0000 [IU] | Freq: Once | INTRAVENOUS | Status: DC
  Filled 2021-09-16: qty 4000

## 2021-09-16 MED ORDER — SODIUM CHLORIDE 0.9% FLUSH: 3.0000 mL | INTRAVENOUS | Status: DC | PRN

## 2021-09-16 MED ORDER — IOHEXOL 350 MG/ML SOLN
INTRAVENOUS | Status: DC | PRN
  Administered 2021-09-16: 47 mL

## 2021-09-16 MED ORDER — ASPIRIN 81 MG PO CHEW
324.0000 mg | CHEWABLE_TABLET | Freq: Once | ORAL | Status: AC
  Administered 2021-09-16: 324 mg via ORAL
  Filled 2021-09-16: qty 4

## 2021-09-16 MED ORDER — HEPARIN (PORCINE) IN NACL 1000-0.9 UT/500ML-% IV SOLN
INTRAVENOUS | Status: AC
  Filled 2021-09-16: qty 1000

## 2021-09-16 MED ORDER — MIDAZOLAM HCL 2 MG/2ML IJ SOLN
INTRAMUSCULAR | Status: DC | PRN
  Administered 2021-09-16: 1 mg via INTRAVENOUS

## 2021-09-16 MED ORDER — FLUTICASONE FUROATE-VILANTEROL 100-25 MCG/INH IN AEPB
1.0000 | INHALATION_SPRAY | Freq: Every day | RESPIRATORY_TRACT | Status: DC
  Administered 2021-09-17: 1 via RESPIRATORY_TRACT
  Filled 2021-09-16: qty 28

## 2021-09-16 MED ORDER — LIDOCAINE HCL 1 % IJ SOLN
INTRAMUSCULAR | Status: AC
  Filled 2021-09-16: qty 20

## 2021-09-16 MED ORDER — ASPIRIN 81 MG PO CHEW: 81.0000 mg | CHEWABLE_TABLET | ORAL | Status: DC

## 2021-09-16 MED ORDER — SODIUM CHLORIDE 0.9 % WEIGHT BASED INFUSION: 1.0000 mL/kg/h | INTRAVENOUS | Status: DC

## 2021-09-16 MED ORDER — NITROGLYCERIN 0.4 MG SL SUBL: 0.4000 mg | SUBLINGUAL_TABLET | SUBLINGUAL | Status: DC | PRN

## 2021-09-16 MED ORDER — IOHEXOL 350 MG/ML SOLN
75.0000 mL | Freq: Once | INTRAVENOUS | Status: AC | PRN
  Administered 2021-09-16: 75 mL via INTRAVENOUS

## 2021-09-16 MED ORDER — IPRATROPIUM-ALBUTEROL 0.5-2.5 (3) MG/3ML IN SOLN: 3.0000 mL | Freq: Four times a day (QID) | RESPIRATORY_TRACT | Status: DC | PRN

## 2021-09-16 MED ORDER — IPRATROPIUM-ALBUTEROL 0.5-2.5 (3) MG/3ML IN SOLN
3.0000 mL | Freq: Once | RESPIRATORY_TRACT | Status: AC
  Filled 2021-09-16: qty 3

## 2021-09-16 MED ORDER — MORPHINE SULFATE (PF) 4 MG/ML IV SOLN
4.0000 mg | Freq: Once | INTRAVENOUS | Status: AC
  Administered 2021-09-16: 4 mg via INTRAVENOUS
  Filled 2021-09-16: qty 1

## 2021-09-16 MED ORDER — SODIUM CHLORIDE 0.9% FLUSH
3.0000 mL | Freq: Two times a day (BID) | INTRAVENOUS | Status: DC
Start: 2021-09-16 — End: 2021-09-16

## 2021-09-16 MED ORDER — MIDAZOLAM HCL 2 MG/2ML IJ SOLN
INTRAMUSCULAR | Status: AC
  Filled 2021-09-16: qty 2

## 2021-09-16 MED ORDER — LIDOCAINE HCL (PF) 1 % IJ SOLN
INTRAMUSCULAR | Status: DC | PRN
  Administered 2021-09-16: 2 mL

## 2021-09-16 MED ORDER — DM-GUAIFENESIN ER 30-600 MG PO TB12: 1.0000 | ORAL_TABLET | Freq: Two times a day (BID) | ORAL | Status: DC | PRN

## 2021-09-16 MED ORDER — HEPARIN SODIUM (PORCINE) 1000 UNIT/ML IJ SOLN
INTRAMUSCULAR | Status: DC | PRN
  Administered 2021-09-16: 4000 [IU] via INTRAVENOUS

## 2021-09-16 MED ORDER — MELATONIN 5 MG PO TABS
5.0000 mg | ORAL_TABLET | Freq: Every day | ORAL | Status: DC
  Administered 2021-09-16: 5 mg via ORAL
  Filled 2021-09-16: qty 1

## 2021-09-16 MED ORDER — NICOTINE 21 MG/24HR TD PT24
21.0000 mg | MEDICATED_PATCH | Freq: Every day | TRANSDERMAL | Status: DC
  Filled 2021-09-16: qty 1

## 2021-09-16 MED ORDER — BENZONATATE 100 MG PO CAPS
200.0000 mg | ORAL_CAPSULE | Freq: Once | ORAL | Status: AC
  Administered 2021-09-16: 200 mg via ORAL
  Filled 2021-09-16: qty 2

## 2021-09-16 MED ORDER — LUBIPROSTONE 24 MCG PO CAPS
24.0000 ug | ORAL_CAPSULE | Freq: Two times a day (BID) | ORAL | Status: DC
  Administered 2021-09-16 – 2021-09-17 (×3): 24 ug via ORAL
  Filled 2021-09-16 (×4): qty 1

## 2021-09-16 MED ORDER — ONDANSETRON HCL 4 MG/2ML IJ SOLN: 4.0000 mg | Freq: Three times a day (TID) | INTRAMUSCULAR | Status: DC | PRN

## 2021-09-16 MED ORDER — SODIUM CHLORIDE 0.9 % WEIGHT BASED INFUSION
3.0000 mL/kg/h | INTRAVENOUS | Status: DC
Start: 2021-09-17 — End: 2021-09-16

## 2021-09-16 MED ORDER — HYDRALAZINE HCL 20 MG/ML IJ SOLN: 5.0000 mg | INTRAMUSCULAR | Status: DC | PRN

## 2021-09-16 MED ORDER — PREGABALIN 50 MG PO CAPS
50.0000 mg | ORAL_CAPSULE | Freq: Every day | ORAL | Status: DC
  Administered 2021-09-16: 50 mg via ORAL
  Filled 2021-09-16: qty 1

## 2021-09-16 MED ORDER — DIAZEPAM 5 MG PO TABS
10.0000 mg | ORAL_TABLET | Freq: Three times a day (TID) | ORAL | Status: DC | PRN
  Administered 2021-09-16: 10 mg via ORAL
  Filled 2021-09-16: qty 2

## 2021-09-16 MED ORDER — VERAPAMIL HCL 2.5 MG/ML IV SOLN
INTRAVENOUS | Status: DC | PRN
  Administered 2021-09-16: 2.5 mg via INTRA_ARTERIAL

## 2021-09-16 MED ORDER — SODIUM CHLORIDE 0.9% FLUSH
3.0000 mL | Freq: Two times a day (BID) | INTRAVENOUS | Status: DC
  Administered 2021-09-16: 3 mL via INTRAVENOUS

## 2021-09-16 MED ORDER — SODIUM CHLORIDE 0.9 % IV SOLN: 250.0000 mL | INTRAVENOUS | Status: DC | PRN

## 2021-09-16 MED ORDER — MORPHINE SULFATE ER 15 MG PO TBCR
30.0000 mg | EXTENDED_RELEASE_TABLET | Freq: Two times a day (BID) | ORAL | Status: DC
Start: 2021-09-16 — End: 2021-09-16

## 2021-09-16 MED ORDER — ALBUTEROL SULFATE (2.5 MG/3ML) 0.083% IN NEBU: 2.5000 mg | INHALATION_SOLUTION | RESPIRATORY_TRACT | Status: DC | PRN

## 2021-09-16 MED ORDER — FENTANYL CITRATE (PF) 100 MCG/2ML IJ SOLN
INTRAMUSCULAR | Status: DC | PRN
  Administered 2021-09-16: 25 ug via INTRAVENOUS

## 2021-09-16 MED ORDER — MORPHINE SULFATE ER 15 MG PO TBCR
30.0000 mg | EXTENDED_RELEASE_TABLET | Freq: Two times a day (BID) | ORAL | Status: DC
  Administered 2021-09-16 – 2021-09-17 (×3): 30 mg via ORAL
  Filled 2021-09-16: qty 1
  Filled 2021-09-16 (×2): qty 2

## 2021-09-16 MED ORDER — SODIUM CHLORIDE 0.9 % IV SOLN: INTRAVENOUS | Status: AC

## 2021-09-16 MED ORDER — CROMOLYN SODIUM 4 % OP SOLN
1.0000 [drp] | Freq: Four times a day (QID) | OPHTHALMIC | Status: DC
  Administered 2021-09-16 – 2021-09-17 (×3): 1 [drp] via OPHTHALMIC
  Filled 2021-09-16: qty 10

## 2021-09-16 MED ORDER — SODIUM CHLORIDE 0.9 % IV SOLN: INTRAVENOUS | Status: DC

## 2021-09-16 MED ORDER — IPRATROPIUM-ALBUTEROL 0.5-2.5 (3) MG/3ML IN SOLN
RESPIRATORY_TRACT | Status: AC
  Administered 2021-09-16: 3 mL via RESPIRATORY_TRACT
  Filled 2021-09-16: qty 3

## 2021-09-16 MED ORDER — ACETAMINOPHEN 325 MG PO TABS: 650.0000 mg | ORAL_TABLET | Freq: Four times a day (QID) | ORAL | Status: DC | PRN

## 2021-09-16 MED ORDER — HEPARIN (PORCINE) IN NACL 1000-0.9 UT/500ML-% IV SOLN
INTRAVENOUS | Status: DC | PRN
  Administered 2021-09-16: 1000 mL

## 2021-09-16 MED ORDER — VERAPAMIL HCL 2.5 MG/ML IV SOLN
INTRAVENOUS | Status: AC
  Filled 2021-09-16: qty 2

## 2021-09-16 MED ORDER — IPRATROPIUM-ALBUTEROL 0.5-2.5 (3) MG/3ML IN SOLN
3.0000 mL | RESPIRATORY_TRACT | Status: DC
  Administered 2021-09-16 – 2021-09-17 (×3): 3 mL via RESPIRATORY_TRACT
  Filled 2021-09-16 (×4): qty 3

## 2021-09-16 MED ORDER — MORPHINE SULFATE (PF) 2 MG/ML IV SOLN
2.0000 mg | INTRAVENOUS | Status: DC | PRN
Start: 2021-09-16 — End: 2021-09-17

## 2021-09-16 MED ORDER — HEPARIN (PORCINE) 25000 UT/250ML-% IV SOLN
900.0000 [IU]/h | INTRAVENOUS | Status: DC
  Filled 2021-09-16: qty 250

## 2021-09-16 MED ORDER — FENTANYL CITRATE (PF) 100 MCG/2ML IJ SOLN
INTRAMUSCULAR | Status: AC
  Filled 2021-09-16: qty 2

## 2021-09-16 SURGICAL SUPPLY — 10 items
CATH INFINITI 5FR JK (CATHETERS) ×2 IMPLANT
DEVICE RAD TR BAND REGULAR (VASCULAR PRODUCTS) ×2 IMPLANT
DRAPE BRACHIAL (DRAPES) ×2 IMPLANT
GLIDESHEATH SLEND SS 6F .021 (SHEATH) ×2 IMPLANT
GUIDEWIRE INQWIRE 1.5J.035X260 (WIRE) ×1 IMPLANT
INQWIRE 1.5J .035X260CM (WIRE) ×2
PACK CARDIAC CATH (CUSTOM PROCEDURE TRAY) ×2 IMPLANT
PROTECTION STATION PRESSURIZED (MISCELLANEOUS) ×2
SET ATX SIMPLICITY (MISCELLANEOUS) ×2 IMPLANT
STATION PROTECTION PRESSURIZED (MISCELLANEOUS) ×1 IMPLANT

## 2021-09-16 NOTE — ED Triage Notes (Addendum)
Pt presents to ED with c/o of SOB that has started since he started taking a new HIV medications that he started this past Saturday. Pt states he was taking other HIV meds but states they "pushed this one on me because it was newer". Pt reports chest pain with SOB. Pt denies N/V/D. Pt states he did skip one dose due to feeling not well. Pt has clear lungs throughout but does appear to have a productive.   Pt states he was told to call 911 or MD if SOB occurred. Pt is A&Ox4. Pt is speaking in full sentences at this time. Pt is on RA with a 02 sat of 94-96%. NAD noted at this time. Pt is A&Ox4.

## 2021-09-16 NOTE — Consult Note (Signed)
ANTICOAGULATION CONSULT NOTE - Initial Consult  Pharmacy Consult for heparin drip Indication: chest pain/ACS  Allergies  Allergen Reactions   Dovato [Dolutegravir-Lamivudine] Other (See Comments)    Severe Flu like symptoms Sweating  Shortness of breath Body aches    Patient Measurements: Height: 5\' 10"  (177.8 cm) Weight: 75.3 kg (166 lb) IBW/kg (Calculated) : 73 Heparin Dosing Weight: 75kg  Vital Signs: Temp: 98.4 F (36.9 C) (09/23 0747) Temp Source: Oral (09/23 0747) BP: 143/78 (09/23 1230) Pulse Rate: 62 (09/23 1230)  Labs: Recent Labs    09/16/21 0748 09/16/21 1033  HGB 16.0  --   HCT 46.9  --   PLT 220  --   CREATININE 1.08  --   TROPONINIHS 30* 531*    Estimated Creatinine Clearance: 69.5 mL/min (by C-G formula based on SCr of 1.08 mg/dL).   Medical History: Past Medical History:  Diagnosis Date   Anxiety    Chronic nausea    Degenerative disk disease    HIV (human immunodeficiency virus infection) ([facility])    HIV (human immunodeficiency virus infection) ([place])    Insomnia    Muscle spasm    Rotator cuff tear     Medications:  No PTA anticoagulation of record  Assessment: 65 yo male with chest pain and elevated troponin (531) - pharmacy has been consulted to initiate and monitor a heparin drip per protocol.  Goal of Therapy:  Heparin level 0.3-0.7 units/ml Monitor platelets by anticoagulation protocol: Yes   Plan:  Give 4000 units bolus x 1 Start heparin infusion at 900 units/hr Check anti-Xa level in 6 hours and daily while on heparin Continue to monitor H&H and platelets  Lu Duffel, PharmD, BCPS Clinical Pharmacist 09/16/2021 12:47 PM

## 2021-09-16 NOTE — Consult Note (Signed)
Cardiology Consultation:   Patient ID: Albert Wong MRN: [id number]; DOB: [birth year]  Admit date: 09/16/2021 Date of Consult: 09/16/2021  PCP:  Jodi Marble, MD   [facility] Providers Cardiologist:  [place]   Patient Profile:   Albert Wong is a 65 y.o. male with a hx of HIV disease diagnosed in early 57s, treated hepatitis, COPD, tobacco use who is being seen 09/16/2021 for the evaluation of shortness of breath and NSTEMI at the request of Dr. Blaine Hamper.  History of Present Illness:   Mr. Albert Wong has not been seen be cardiology in the past. No prior MI, stent, CHF. Family history positive for MI in father in his 11s. Patient smokes intermittently. No alcohol or drug use. He lives by himself and able to take care of himself ok. PTA able to mow the lawn without chest pain, but has chronic SOB.   Patient came into the [facility] ER 09/16/21 for shortness of breath. Patient's HIV medications were recently switched to [place]. After taking the medication he would feel generally bad with flu-like symptoms and shortness of breath. This morning symptoms were more acute and severe. Shortness of breath was significant. Patient denies chest pain, but is unsure. He denies LLE, orthopnea, pnd.   In the ER BP 136/60, pulse 87, temp 6F, RR 24, 94%O2. Labs showed sodium 141, potassium 3.6, Scr 1.08, BUN 9, alk phose 35, normal LFTs, glucose 110, WBC 6.7, Hgb 16.0, BNP 48. HS trop 30>>531. CXR unremarkable. CTA chest showed no PE, aspiration, emphysema, aortic atherosclerosis. EK with NSR and TWI aVL. Patient was started on IV heparin and admitted.   Past Medical History:  Diagnosis Date   Anxiety    Chronic nausea    Degenerative disk disease    HIV (human immunodeficiency virus infection) ([place])    HIV (human immunodeficiency virus infection) ([place])    Insomnia    Muscle spasm    Rotator cuff tear     History reviewed. No pertinent surgical history.   Home Medications:  Prior to Admission  medications   Medication Sig Start Date End Date Taking? Authorizing Provider  BREZTRI AEROSPHERE 160-9-4.8 MCG/ACT AERO Inhale 2 puffs into the lungs 2 (two) times daily. 09/05/21  Yes [provider]  SYMBICORT 80-4.5 MCG/ACT inhaler Inhale 2 puffs into the lungs 2 (two) times daily. 08/15/21  Yes [provider]  tamsulosin (FLOMAX) 0.4 MG CAPS capsule TAKE 1 CAPSULE BY MOUTH ONCE DAILY 09/02/21  Yes Stoioff, Ronda Fairly, MD  amLODipine (NORVASC) 5 MG tablet Take by mouth. Patient not taking: Reported on 09/16/2021    [provider]  B-D 3CC LUER-LOK SYR 21GX1" 21G X 1" 3 ML MISC USE AS DIRECTED WITH TESTOSTERONE 09/04/13   Tommy Medal, Lavell Islam, MD  baclofen (LIORESAL) 10 MG tablet TAKE 1 TABLET BY MOUTH EVERY DAY 11/16/13   Tommy Medal, Lavell Islam, MD  cromolyn (OPTICROM) 4 % ophthalmic solution SMARTSIG:1-2 Drop(s) In Eye(s) 4-6 Times Daily 03/25/21   [provider]  diazepam (VALIUM) 10 MG tablet Take by mouth.    [provider]  dicyclomine (BENTYL) 20 MG tablet Take 20 mg by mouth 4 (four) times daily. 08/15/21   [provider]  Dolutegravir-lamiVUDine (DOVATO) 50-300 MG TABS Take 1 tablet by mouth daily. 09/12/21   Tsosie Billing, MD  Ipratropium-Albuterol (COMBIVENT) 20-100 MCG/ACT AERS respimat Inhale into the lungs. Patient not taking: Reported on 09/16/2021    [provider]  lubiprostone (AMITIZA) 24 MCG capsule Take 24  mcg by mouth 2 (two) times daily. 03/22/21   [provider]  Melatonin 5 MG CAPS Take 1 capsule by mouth at bedtime.    [provider]  morphine (MS CONTIN) 30 MG 12 hr tablet Take 1 tablet (30 mg total) by mouth 2 (two) times daily. 11/03/13   Truman Hayward, MD  pregabalin (LYRICA) 50 MG capsule Take 50 mg by mouth at bedtime. 01/19/21   [provider]  rosuvastatin (CRESTOR) 20 MG tablet SMARTSIG:1 Tablet(s) By Mouth Every Evening 08/15/21   [provider]   triamcinolone cream (KENALOG) 0.1 % Apply topically 2 (two) times daily. 08/15/21   [provider]    Inpatient Medications: Scheduled Meds:  [START ON 09/17/2021] aspirin EC  81 mg Oral Daily   ipratropium-albuterol  3 mL Nebulization Q4H   morphine  30 mg Oral Q12H   nicotine  21 mg Transdermal Daily   Continuous Infusions:  sodium chloride     PRN Meds: acetaminophen, albuterol, dextromethorphan-guaiFENesin, hydrALAZINE, morphine injection, nitroGLYCERIN, ondansetron (ZOFRAN) IV  Allergies:    Allergies  Allergen Reactions   Dovato [Dolutegravir-Lamivudine] Other (See Comments)    Severe Flu like symptoms Sweating  Shortness of breath Body aches    Social History:   Social History   Socioeconomic History   Marital status: Single    Spouse name: Not on file   Number of children: Not on file   Years of education: Not on file   Highest education level: Not on file  Occupational History   Not on file  Tobacco Use   Smoking status: Every Day    Packs/day: 1.00    Years: 39.00    Pack years: 39.00    Types: Cigarettes   Smokeless tobacco: Never   Tobacco comments:    Wears patches. Smokes about 6 cigarettes a week.  Substance and Sexual Activity   Alcohol use: Yes   Drug use: No   Sexual activity: Not on file  Other Topics Concern   Not on file  Social History Narrative   Not on file   Social Determinants of Health   Financial Resource Strain: Not on file  Food Insecurity: Not on file  Transportation Needs: Not on file  Physical Activity: Not on file  Stress: Not on file  Social Connections: Not on file  Intimate Partner Violence: Not on file    Family History:    Family History  Problem Relation Age of Onset   Heart disease Father      ROS:  Please see the history of present illness.   All other ROS reviewed and negative.     Physical Exam/Data:   Vitals:   09/16/21 0747 09/16/21 0748 09/16/21 1045  BP: 136/60  (!) 150/71   Pulse: 87  70  Resp: (!) 24  14  Temp: 98.4 F (36.9 C)    TempSrc: Oral    SpO2: 94%  95%  Weight:  75.3 kg   Height:  5' 10" (1.778 m)    No intake or output data in the 24 hours ending 09/16/21 1228 Last 3 Weights 09/16/2021 09/01/2021 07/27/2021  Weight (lbs) 166 lb 166 lb 166 lb  Weight (kg) 75.297 kg 75.297 kg 75.297 kg     Body mass index is 23.82 kg/m.  General:  Well nourished, well developed, in no acute distress HEENT: normal Neck: no JVD Vascular: No carotid bruits; Distal pulses 2+ bilaterally Cardiac:  normal S1, S2; RRR; no murmur  Lungs:  diffusely diminished, +wheezing  Abd: soft, nontender, no hepatomegaly  Ext: no edema Musculoskeletal:  No deformities, BUE and BLE strength normal and equal Skin: warm and dry  Neuro:  CNs 2-12 intact, no focal abnormalities noted Psych:  Normal affect   EKG:  The EKG was personally reviewed and demonstrates: NSR, 63bpm, TWI aVL, low voltage Telemetry:  Telemetry was personally reviewed and demonstrates:  NSR HR 60-70  Relevant CV Studies:  Order echo  Laboratory Data:  High Sensitivity Troponin:   Recent Labs  Lab 09/16/21 0748 09/16/21 1033  TROPONINIHS 30* 531*     Chemistry Recent Labs  Lab 09/16/21 0748  NA 141  K 3.6  CL 105  CO2 27  GLUCOSE 110*  BUN 9  CREATININE 1.08  CALCIUM 8.9  GFRNONAA >60  ANIONGAP 9    Recent Labs  Lab 09/16/21 0748  PROT 7.2  ALBUMIN 4.1  AST 21  ALT 8  ALKPHOS 35*  BILITOT 0.8   Lipids No results for input(s): CHOL, TRIG, HDL, LABVLDL, LDLCALC, CHOLHDL in the last 168 hours.  Hematology Recent Labs  Lab 09/16/21 0748  WBC 6.7  RBC 4.42  HGB 16.0  HCT 46.9  MCV 106.1*  MCH 36.2*  MCHC 34.1  RDW 12.3  PLT 220   Thyroid No results for input(s): TSH, FREET4 in the last 168 hours.  BNP Recent Labs  Lab 09/16/21 0748  BNP 48.7    DDimer No results for input(s): DDIMER in the last 168 hours.   Radiology/Studies:  DG Chest 2 View  Result Date:  09/16/2021 CLINICAL DATA:  Short of breath. EXAM: CHEST - 2 VIEW COMPARISON:  Nine/21/20 FINDINGS: The heart size and mediastinal contours are within normal limits. Both lungs are clear. The visualized skeletal structures are unremarkable. IMPRESSION: No active cardiopulmonary disease. Electronically Signed   By: Kerby Moors M.D.   On: 09/16/2021 08:35   CT Angio Chest PE W and/or Wo Contrast  Result Date: 09/16/2021 CLINICAL DATA:  Shortness of breath EXAM: CT ANGIOGRAPHY CHEST WITH CONTRAST TECHNIQUE: Multidetector CT imaging of the chest was performed using the standard protocol during bolus administration of intravenous contrast. Multiplanar CT image reconstructions and MIPs were obtained to evaluate the vascular anatomy. CONTRAST:  7m OMNIPAQUE IOHEXOL 350 MG/ML SOLN COMPARISON:  Same day chest radiograph, CT chest 04/05/2021 FINDINGS: Cardiovascular: There is satisfactory opacification of the pulmonary arteries to the segmental level. There is no evidence of acute or chronic pulmonary embolism. The heart is not enlarged. There is no pericardial effusion. There is mild scattered coronary artery calcification and minimal calcified atherosclerotic plaque of the aortic arch. Mediastinum/Nodes: The imaged thyroid is unremarkable. The esophagus is grossly unremarkable. There is no mediastinal, hilar, or axillary lymphadenopathy. Lungs/Pleura: The trachea and central airways are patent. There is scattered debris in the trachea and central airways with areas of mucoid impaction distally. There is mild central bronchial wall thickening. There is a background of moderate centrilobular and paraseptal emphysema. Patchy opacities in the right middle lobe and lingula are similar to the prior CT and likely reflect scar. There is no new focal consolidation. There is no pulmonary edema. There is no pleural effusion or pneumothorax. Upper Abdomen: The imaged portions of the upper abdominal viscera are unremarkable.  Musculoskeletal: There is no acute osseous abnormality or aggressive osseous lesion. Review of the MIP images confirms the above findings. IMPRESSION: 1. No evidence of pulmonary embolism. 2. Debris in the trachea and airways consistent with aspiration.  3. Background of moderate centrilobular and paraseptal emphysema, unchanged. Aortic Atherosclerosis (ICD10-I70.0) and Emphysema (ICD10-J43.9). Electronically Signed   By: Valetta Mole M.D.   On: 09/16/2021 11:38     Assessment and Plan:   NSTEMI - presented with SOB after being switched to new HIV medication. CXR and CTA chest unremarkable. HS Trop elevated to 531 and started on IV heparin. EKG with NSR and TWI aVL inversion. Respiratory panel negative. - continue IV heparin - start Aspirin 82m daily and BB - continue home crestor - continue to trend troponin - risk stratify with lipid panel and A1C - will order echo - plan for cardiac catheterization Risks and benefits of cardiac catheterization have been discussed with the patient.  These include bleeding, infection, kidney damage, stroke, heart attack, death.  The patient understands these risks and is willing to proceed.   HTN - continue PTA amlodipine 555mdaily - Bps elevated, add BB as above  COPD/tobacco use - reports he smokes intermittently>>complete cessation recommended  HIV - ID consulted  HLD - PTA crestor 2077maily>>continue - re-check lipid panel   For questions or updates, please contact [email or web address] Please consult [email or web address] for contact info under    Signed,  H FNinfa MeekerA-C  09/16/2021 12:28 PM

## 2021-09-16 NOTE — H&P (Signed)
History and Physical    Albert Wong DOB: September 02, 1956 DOA: 09/16/2021  Referring MD/NP/PA:   PCP: Jodi Marble, MD   Patient coming from:  The patient is coming from home.  At baseline, pt is independent for most of ADL.        Chief Complaint: SOB  HPI: Albert Wong is a 65 y.o. male with medical history significant of hypertension, hyperlipidemia, COPD, HIV (CD4=570 on 01/01/13, and VL undetectable on 07/21/2021), HCV, insomnia, degenerative disc disease on MS Contin, tobacco abuse, anxiety, who presents with shortness of breath.  Pt states that he has shortness of breath for more than 3 days, particularly on exertion.  Patient has dry cough, denies chest pain.  No fever or chills.  Patient states that his symptoms started after he was switched to a new HIV medication.  Patient has nausea, no vomiting, diarrhea or abdominal pain, no symptoms of UTI.  No recent fall or head injury.  No rectal bleeding or dark stool.  ED Course: pt was found to have troponin level 3 --> 531, negative COVID PCR, BNP 48, electrolytes renal function okay, temperature normal, blood pressure 115/71, heart rate 87, RR 24, 10, oxygen saturation 90-100% on room air.  Chest x-ray negative.  CT angiogram is negative for PE.  Patient is admitted to progressive bed as inpatient.  Dr. Sophronia Simas of cardiology is consulted.  CTA of chest: 1. No evidence of pulmonary embolism. 2. Debris in the trachea and airways consistent with aspiration. 3. Background of moderate centrilobular and paraseptal emphysema, unchanged.   Aortic Atherosclerosis (ICD10-I70.0) and Emphysema (ICD10-J43.9).  Review of Systems:   General: no fevers, chills, no body weight gain, has fatigue HEENT: no blurry vision, hearing changes or sore throat Respiratory: has dyspnea, coughing, no wheezing CV: no chest pain, no palpitations GI: has nausea,  no vomiting, abdominal pain, diarrhea, constipation GU: no dysuria, burning on  urination, increased urinary frequency, hematuria  Ext: no leg edema Neuro: no unilateral weakness, numbness, or tingling, no vision change or hearing loss Skin: no rash, no skin tear. MSK: No muscle spasm, no deformity, no limitation of range of movement in spin Heme: No easy bruising.  Travel history: No recent long distant travel.  Allergy:  Allergies  Allergen Reactions   Dovato [Dolutegravir-Lamivudine] Other (See Comments)    Severe Flu like symptoms Sweating  Shortness of breath Body aches    Past Medical History:  Diagnosis Date   Anxiety    Chronic nausea    Degenerative disk disease    HIV (human immunodeficiency virus infection) ([place])    HIV (human immunodeficiency virus infection) ([place])    Insomnia    Muscle spasm    Rotator cuff tear     Past Surgical History:  Procedure Laterality Date   dental procedure N/A     Social History:  reports that he has been smoking cigarettes. He has a 39.00 pack-year smoking history. He has never used smokeless tobacco. He reports current alcohol use. He reports that he does not use drugs.  Family History:  Family History  Problem Relation Age of Onset   Heart disease Father      Prior to Admission medications   Medication Sig Start Date End Date Taking? Authorizing Provider  amLODipine (NORVASC) 5 MG tablet Take by mouth.    [provider]  B-D 3CC LUER-LOK SYR 21GX1" 21G X 1" 3 ML MISC USE AS DIRECTED WITH TESTOSTERONE 09/04/13   Tommy Medal,  Lavell Islam, MD  baclofen (LIORESAL) 10 MG tablet TAKE 1 TABLET BY MOUTH EVERY DAY 11/16/13   Tommy Medal, Lavell Islam, MD  BREZTRI AEROSPHERE 160-9-4.8 MCG/ACT AERO Inhale 2 puffs into the lungs 2 (two) times daily. 09/05/21   [provider]  cromolyn (OPTICROM) 4 % ophthalmic solution SMARTSIG:1-2 Drop(s) In Eye(s) 4-6 Times Daily 03/25/21   [provider]  diazepam (VALIUM) 10 MG tablet Take by mouth.    [provider]  dicyclomine (BENTYL) 20 MG  tablet Take 20 mg by mouth 4 (four) times daily. 08/15/21   [provider]  Dolutegravir-lamiVUDine (DOVATO) 50-300 MG TABS Take 1 tablet by mouth daily. 09/12/21   Tsosie Billing, MD  Ipratropium-Albuterol (COMBIVENT) 20-100 MCG/ACT AERS respimat Inhale into the lungs.    [provider]  lubiprostone (AMITIZA) 24 MCG capsule Take 24 mcg by mouth 2 (two) times daily. 03/22/21   [provider]  Melatonin 5 MG CAPS Take 1 capsule by mouth at bedtime.    [provider]  morphine (MS CONTIN) 30 MG 12 hr tablet Take 1 tablet (30 mg total) by mouth 2 (two) times daily. 11/03/13   Truman Hayward, MD  pregabalin (LYRICA) 50 MG capsule Take 50 mg by mouth at bedtime. 01/19/21   [provider]  rosuvastatin (CRESTOR) 20 MG tablet SMARTSIG:1 Tablet(s) By Mouth Every Evening 08/15/21   [provider]  SYMBICORT 80-4.5 MCG/ACT inhaler Inhale 2 puffs into the lungs 2 (two) times daily. 08/15/21   [provider]  tamsulosin (FLOMAX) 0.4 MG CAPS capsule TAKE 1 CAPSULE BY MOUTH ONCE DAILY 09/02/21   Stoioff, Ronda Fairly, MD  triamcinolone cream (KENALOG) 0.1 % Apply topically 2 (two) times daily. 08/15/21   [provider]    Physical Exam: Vitals:   09/16/21 1351 09/16/21 1450 09/16/21 1500 09/16/21 1515  BP: (!) 144/81 (!) 145/69 135/77 (!) 144/72  Pulse: 66 64 65 65  Resp: 16 13 14 10   Temp: 98.3 F (36.8 C)     TempSrc: Oral     SpO2: 94% 97% 95% 96%  Weight:      Height:       General: Not in acute distress HEENT:       Eyes: PERRL, EOMI, no scleral icterus.       ENT: No discharge from the ears and nose, no pharynx injection, no tonsillar enlargement.        Neck: No JVD, no bruit, no mass felt. Heme: No neck lymph node enlargement. Cardiac: S1/S2, RRR, No murmurs, No gallops or rubs. Respiratory: No rales, wheezing, rhonchi or rubs. GI: Soft, nondistended, nontender, no rebound pain, no organomegaly, BS  present. GU: No hematuria Ext: No pitting leg edema bilaterally. 1+DP/PT pulse bilaterally. Musculoskeletal: No joint deformities, No joint redness or warmth, no limitation of ROM in spin. Skin: No rashes.  Neuro: Alert, oriented X3, cranial nerves II-XII grossly intact, moves all extremities normally. Psych: Patient is not psychotic, no suicidal or hemocidal ideation.  Labs on Admission: I have personally reviewed following labs and imaging studies  CBC: Recent Labs  Lab 09/16/21 0748  WBC 6.7  NEUTROABS 4.1  HGB 16.0  HCT 46.9  MCV 106.1*  PLT 818   Basic Metabolic Panel: Recent Labs  Lab 09/16/21 0748  NA 141  K 3.6  CL 105  CO2 27  GLUCOSE 110*  BUN 9  CREATININE 1.08  CALCIUM 8.9   GFR: Estimated Creatinine Clearance: 69.5 mL/min (by C-G  formula based on SCr of 1.08 mg/dL). Liver Function Tests: Recent Labs  Lab 09/16/21 0748  AST 21  ALT 8  ALKPHOS 35*  BILITOT 0.8  PROT 7.2  ALBUMIN 4.1   No results for input(s): LIPASE, AMYLASE in the last 168 hours. No results for input(s): AMMONIA in the last 168 hours. Coagulation Profile: Recent Labs  Lab 09/16/21 1244  INR 1.1   Cardiac Enzymes: No results for input(s): CKTOTAL, CKMB, CKMBINDEX, TROPONINI in the last 168 hours. BNP (last 3 results) No results for input(s): PROBNP in the last 8760 hours. HbA1C: No results for input(s): HGBA1C in the last 72 hours. CBG: No results for input(s): GLUCAP in the last 168 hours. Lipid Profile: No results for input(s): CHOL, HDL, LDLCALC, TRIG, CHOLHDL, LDLDIRECT in the last 72 hours. Thyroid Function Tests: No results for input(s): TSH, T4TOTAL, FREET4, T3FREE, THYROIDAB in the last 72 hours. Anemia Panel: No results for input(s): VITAMINB12, FOLATE, FERRITIN, TIBC, IRON, RETICCTPCT in the last 72 hours. Urine analysis:    Component Value Date/Time   COLORURINE Straw 07/23/2013 1732   APPEARANCEUR Clear 07/27/2021 1534   LABSPEC 1.005 07/23/2013 1732    PHURINE 7.0 07/23/2013 1732   GLUCOSEU Negative 07/27/2021 1534   GLUCOSEU Negative 07/23/2013 1732   HGBUR Negative 07/23/2013 1732   BILIRUBINUR Negative 07/27/2021 1534   BILIRUBINUR Negative 07/23/2013 1732   KETONESUR Negative 07/23/2013 1732   PROTEINUR Negative 07/27/2021 1534   PROTEINUR Negative 07/23/2013 1732   NITRITE Negative 07/27/2021 1534   NITRITE Negative 07/23/2013 1732   LEUKOCYTESUR Negative 07/27/2021 1534   LEUKOCYTESUR Negative 07/23/2013 1732   Sepsis Labs: @LABRCNTIP (procalcitonin:4,lacticidven:4) ) Recent Results (from the past 240 hour(s))  Resp Panel by RT-PCR (Flu A&B, Covid) Nasopharyngeal Swab     Status: None   Collection Time: 09/16/21  7:49 AM   Specimen: Nasopharyngeal Swab; Nasopharyngeal(NP) swabs in vial transport medium  Result Value Ref Range Status   SARS Coronavirus 2 by RT PCR NEGATIVE NEGATIVE Final    Comment: (NOTE) SARS-CoV-2 target nucleic acids are NOT DETECTED.  The SARS-CoV-2 RNA is generally detectable in upper respiratory specimens during the acute phase of infection. The lowest concentration of SARS-CoV-2 viral copies this assay can detect is 138 copies/mL. A negative result does not preclude SARS-Cov-2 infection and should not be used as the sole basis for treatment or other patient management decisions. A negative result may occur with  improper specimen collection/handling, submission of specimen other than nasopharyngeal swab, presence of viral mutation(s) within the areas targeted by this assay, and inadequate number of viral copies(<138 copies/mL). A negative result must be combined with clinical observations, patient history, and epidemiological information. The expected result is Negative.  Fact Sheet for Patients:  EntrepreneurPulse.com.au  Fact Sheet for Healthcare Providers:  IncredibleEmployment.be  This test is no t yet approved or cleared by the [place] FDA and   has been authorized for detection and/or diagnosis of SARS-CoV-2 by FDA under an Emergency Use Authorization (EUA). This EUA will remain  in effect (meaning this test can be used) for the duration of the COVID-19 declaration under Section 564(b)(1) of the Act, 21 U.S.C.section 360bbb-3(b)(1), unless the authorization is terminated  or revoked sooner.       Influenza A by PCR NEGATIVE NEGATIVE Final   Influenza B by PCR NEGATIVE NEGATIVE Final    Comment: (NOTE) The Xpert Xpress SARS-CoV-2/FLU/RSV plus assay is intended as an aid in the diagnosis of influenza from Nasopharyngeal swab specimens and should  not be used as a sole basis for treatment. Nasal washings and aspirates are unacceptable for Xpert Xpress SARS-CoV-2/FLU/RSV testing.  Fact Sheet for Patients: EntrepreneurPulse.com.au  Fact Sheet for Healthcare Providers: IncredibleEmployment.be  This test is not yet approved or cleared by the [place] FDA and has been authorized for detection and/or diagnosis of SARS-CoV-2 by FDA under an Emergency Use Authorization (EUA). This EUA will remain in effect (meaning this test can be used) for the duration of the COVID-19 declaration under Section 564(b)(1) of the Act, 21 U.S.C. section 360bbb-3(b)(1), unless the authorization is terminated or revoked.  Performed at [facility], [address], [place], [place] [address]      Radiological Exams on Admission: DG Chest 2 View  Result Date: 09/16/2021 CLINICAL DATA:  Short of breath. EXAM: CHEST - 2 VIEW COMPARISON:  Nine/21/20 FINDINGS: The heart size and mediastinal contours are within normal limits. Both lungs are clear. The visualized skeletal structures are unremarkable. IMPRESSION: No active cardiopulmonary disease. Electronically Signed   By: Kerby Moors M.D.   On: 09/16/2021 08:35   CT Angio Chest PE W and/or Wo Contrast  Result Date: 09/16/2021 CLINICAL DATA:   Shortness of breath EXAM: CT ANGIOGRAPHY CHEST WITH CONTRAST TECHNIQUE: Multidetector CT imaging of the chest was performed using the standard protocol during bolus administration of intravenous contrast. Multiplanar CT image reconstructions and MIPs were obtained to evaluate the vascular anatomy. CONTRAST:  50mL OMNIPAQUE IOHEXOL 350 MG/ML SOLN COMPARISON:  Same day chest radiograph, CT chest 04/05/2021 FINDINGS: Cardiovascular: There is satisfactory opacification of the pulmonary arteries to the segmental level. There is no evidence of acute or chronic pulmonary embolism. The heart is not enlarged. There is no pericardial effusion. There is mild scattered coronary artery calcification and minimal calcified atherosclerotic plaque of the aortic arch. Mediastinum/Nodes: The imaged thyroid is unremarkable. The esophagus is grossly unremarkable. There is no mediastinal, hilar, or axillary lymphadenopathy. Lungs/Pleura: The trachea and central airways are patent. There is scattered debris in the trachea and central airways with areas of mucoid impaction distally. There is mild central bronchial wall thickening. There is a background of moderate centrilobular and paraseptal emphysema. Patchy opacities in the right middle lobe and lingula are similar to the prior CT and likely reflect scar. There is no new focal consolidation. There is no pulmonary edema. There is no pleural effusion or pneumothorax. Upper Abdomen: The imaged portions of the upper abdominal viscera are unremarkable. Musculoskeletal: There is no acute osseous abnormality or aggressive osseous lesion. Review of the MIP images confirms the above findings. IMPRESSION: 1. No evidence of pulmonary embolism. 2. Debris in the trachea and airways consistent with aspiration. 3. Background of moderate centrilobular and paraseptal emphysema, unchanged. Aortic Atherosclerosis (ICD10-I70.0) and Emphysema (ICD10-J43.9). Electronically Signed   By: Valetta Mole M.D.    On: 09/16/2021 11:38   CARDIAC CATHETERIZATION  Result Date: 09/16/2021   Mid Cx lesion is 40% stenosed.   Mid RCA lesion is 30% stenosed.   Dist RCA lesion is 40% stenosed.   There is mild left ventricular systolic dysfunction.   LV end diastolic pressure is moderately elevated.   The left ventricular ejection fraction is 35-45% by visual estimate. 1.  Mild to moderate nonobstructive coronary artery disease. 2.  Moderately reduced LV systolic function with wall motion abnormality suggestive of stress-induced cardiomyopathy.  Mildly elevated left ventricular end-diastolic pressure. Recommendations: Recommend medical therapy for nonobstructive coronary artery disease. Obtain an echocardiogram to better evaluate wall motion.  EKG: I have personally reviewed.  Sinus rhythm, QTC 440, low voltage, poor R wave progression  Assessment/Plan Principal Problem:   NSTEMI (non-ST elevated myocardial infarction) (HCC) Active Problems:   H/O degenerative disc disease   Anxiety   HTN (hypertension)   HIV (human immunodeficiency virus infection) (HCC)   HLD (hyperlipidemia)   Tobacco abuse   COPD (chronic obstructive pulmonary disease) (HCC)  NSTEMI (non-ST elevated myocardial infarction) ([place]): trop 30 --> 531. CTA negative for PE. Dr. Fletcher Anon of card is consulted, will do cardiac catheterization today.   - admit to progressive unit as inpatient - IV heparin  - Trend Trop - prn Nitroglycerin, Morphine, and aspirin, cretor - Risk factor stratification: will check FLP and A1C  - check UDS - 2d echo  H/O degenerative disc disease: -continue home MS-contin and baclofen  Anxiety -Continue home as needed Valium  HTN (hypertension) -IV hydralazine as needed -Amlodipine 5 mg daily  HIV (human immunodeficiency virus infection) ([place]): His HIV is well controlled.  Viral load is not detectable on 07/18/2021.  Patient keeps saying that his symptoms started after having switched to new HIV medication  Dovato.  He does not want to continue this medication.  I consulted Dr. Delaine Lame of ID, who does not think this medication caused his NSTEMI, but Dr. Delaine Lame recommend to hold  Dovato now.  HLD (hyperlipidemia) -Crestor  Tobacco abuse -Nicotine patch  COPD (chronic obstructive pulmonary disease) ([place]): Patient does not have wheezing or rhonchi on auscultation. -Continue bronchodilators     DVT ppx: on IV Heparin     Code Status: Full code Family Communication: I have tried to call her sister, but that phone number is wrong Disposition Plan:  Anticipate discharge back to previous environment Consults called:  Dr. Fletcher Anon of card Admission status and Level of care: Progressive Cardiac:   as inpt      Status is: Inpatient  Remains inpatient appropriate because:Inpatient level of care appropriate due to severity of illness  Dispo: The patient is from: Home              Anticipated d/c is to: Home              Patient currently is not medically stable to d/c.   Difficult to place patient No        Date of Service 09/16/2021    [facility]   If 7PM-7AM, please contact night-coverage [email or web address] 09/16/2021, 3:25 PM

## 2021-09-16 NOTE — ED Provider Notes (Addendum)
[facility] Emergency Department Provider Note  ____________________________________________   Event Date/Time   First MD Initiated Contact with Patient 09/16/21 [phone]     (approximate)  I have reviewed the triage vital signs and the nursing notes.   HISTORY  Chief Complaint Shortness of Breath    HPI Albert Wong is a 65 y.o. male HIV disease diagnose din early 92s , Treated hepatitis C , COPD, who comes in with SOB.  Patient reports shortness of breath that started yesterday, constant, worse with exertion, better at rest.  States that his last viral load was undetectable and his last CD4 count was in the 600s.  Patient reports being compliant with his HIV medications until a few days ago he was switched to Dovato from [place].  Patient states that he was just switched because it was a newer medicine that was less pills.  He states that he took the medication on Tuesday, Wednesday but then when he started having symptoms on Thursday he missed a dose on Thursday.  He thought it could be side effects from the medication.  Patient does report a little bit of associated chest pain but states that the biggest issue is the coughing.  Patient denies any leg swelling, history of blood clots          Past Medical History:  Diagnosis Date   Anxiety    Chronic nausea    Degenerative disk disease    HIV (human immunodeficiency virus infection) ([place])    Insomnia    Muscle spasm    Rotator cuff tear     Patient Active Problem List   Diagnosis Date Noted   Weight loss 08/21/2012   Allergic rhinitis, cause unspecified    HTN (hypertension) 04/17/2012   Poor appetite 04/17/2012   Lumbago 01/17/2012   Fall 01/17/2012   Hepatitis C 01/17/2012   Conjunctivitis, allergic 01/10/2012   Degenerative disk disease    Rotator cuff tear    Insomnia    Anxiety    Chronic nausea    Muscle spasm    HIV INFECTION 02/10/2010    No past surgical history on  file.  Prior to Admission medications   Medication Sig Start Date End Date Taking? Authorizing Provider  amLODipine (NORVASC) 5 MG tablet Take by mouth.    [provider]  B-D 3CC LUER-LOK SYR 21GX1" 21G X 1" 3 ML MISC USE AS DIRECTED WITH TESTOSTERONE 09/04/13   Tommy Medal, Lavell Islam, MD  baclofen (LIORESAL) 10 MG tablet TAKE 1 TABLET BY MOUTH EVERY DAY 11/16/13   Tommy Medal, Lavell Islam, MD  cromolyn (OPTICROM) 4 % ophthalmic solution SMARTSIG:1-2 Drop(s) In Eye(s) 4-6 Times Daily 03/25/21   [provider]  diazepam (VALIUM) 10 MG tablet Take by mouth.    [provider]  dicyclomine (BENTYL) 20 MG tablet Take 20 mg by mouth 4 (four) times daily. 08/15/21   [provider]  Dolutegravir-lamiVUDine (DOVATO) 50-300 MG TABS Take 1 tablet by mouth daily. 09/12/21   Tsosie Billing, MD  Ipratropium-Albuterol (COMBIVENT) 20-100 MCG/ACT AERS respimat Inhale into the lungs.    [provider]  lubiprostone (AMITIZA) 24 MCG capsule Take 24 mcg by mouth 2 (two) times daily. 03/22/21   [provider]  Melatonin 5 MG CAPS Take 1 capsule by mouth at bedtime.    [provider]  morphine (MS CONTIN) 30 MG 12 hr tablet Take 1 tablet (30 mg total) by mouth 2 (two) times daily. 11/03/13  Tommy Medal, Lavell Islam, MD  pregabalin (LYRICA) 50 MG capsule Take 50 mg by mouth at bedtime. 01/19/21   [provider]  rosuvastatin (CRESTOR) 20 MG tablet SMARTSIG:1 Tablet(s) By Mouth Every Evening 08/15/21   [provider]  SYMBICORT 80-4.5 MCG/ACT inhaler Inhale 2 puffs into the lungs 2 (two) times daily. 08/15/21   [provider]  tamsulosin (FLOMAX) 0.4 MG CAPS capsule TAKE 1 CAPSULE BY MOUTH ONCE DAILY 09/02/21   Stoioff, Ronda Fairly, MD  triamcinolone cream (KENALOG) 0.1 % Apply topically 2 (two) times daily. 08/15/21   [provider]    Allergies Patient has no known allergies.  Family History  Problem Relation Age of  Onset   Heart disease Father     Social History Social History   Tobacco Use   Smoking status: Every Day    Packs/day: 1.00    Years: 39.00    Pack years: 39.00    Types: Cigarettes   Smokeless tobacco: Never   Tobacco comments:    Wears patches. Smokes about 6 cigarettes a week.  Substance Use Topics   Alcohol use: Yes   Drug use: No      Review of Systems Constitutional: No fever/chills Eyes: No visual changes. ENT: No sore throat. Cardiovascular: Positive chest pain Respiratory: Positive for SOB, cough Gastrointestinal: No abdominal pain.  No nausea, no vomiting.  No diarrhea.  No constipation. Genitourinary: Negative for dysuria. Musculoskeletal: Negative for back pain. Skin: Negative for rash. Neurological: Negative for headaches, focal weakness or numbness. All other ROS negative ____________________________________________   PHYSICAL EXAM:  VITAL SIGNS: Blood pressure 136/60, pulse 87, temperature 98.4 F (36.9 C), temperature source Oral, resp. rate (!) 24, height 5\' 10"  (1.778 m), weight 75.3 kg, SpO2 94 %.   Constitutional: Alert and oriented. Well appearing and in no acute distress. Eyes: Conjunctivae are normal. EOMI. Head: Atraumatic. Nose: No congestion/rhinnorhea. Mouth/Throat: Mucous membranes are moist.   Neck: No stridor. Trachea Midline. FROM Cardiovascular: Normal rate, regular rhythm. Grossly normal heart sounds.  Good peripheral circulation. Respiratory: Lungs are overall clear but patient is frequently coughing Gastrointestinal: Soft and nontender. No distention. No abdominal bruits.  Musculoskeletal: No lower extremity tenderness nor edema.  No joint effusions. Neurologic:  Normal speech and language. No gross focal neurologic deficits are appreciated.  Skin:  Skin is warm, dry and intact. No rash noted. Psychiatric: Mood and affect are normal. Speech and behavior are normal. GU: Deferred    ____________________________________________   LABS (all labs ordered are listed, but only abnormal results are displayed)  Labs Reviewed  RESP PANEL BY RT-PCR (FLU A&B, COVID) ARPGX2  CBC WITH DIFFERENTIAL/PLATELET  COMPREHENSIVE METABOLIC PANEL  BRAIN NATRIURETIC PEPTIDE  PROCALCITONIN  TROPONIN I (HIGH SENSITIVITY)   ____________________________________________   ED ECG REPORT I, Vanessa [place], the attending physician, personally viewed and interpreted this ECG.  Normal sinus rate of 67, no ST elevation, no T wave versions, normal intervals, some artifact noted for some reason this first EKG was not been uploaded into the system so a repeat EKG was done  Repeat EKG is normal sinus rate of 63, no ST elevation, no T wave inversions except for slightly in aVL, normal intervals  ____________________________________________  RADIOLOGY I, Vanessa [place], personally viewed and evaluated these images (plain radiographs) as part of my medical decision making, as well as reviewing the written report by the radiologist.  ED MD interpretation:  no pna   Official radiology report(s): DG Chest 2 View  Result Date: 09/16/2021 CLINICAL DATA:  Short of breath. EXAM: CHEST - 2 VIEW COMPARISON:  Nine/21/20 FINDINGS: The heart size and mediastinal contours are within normal limits. Both lungs are clear. The visualized skeletal structures are unremarkable. IMPRESSION: No active cardiopulmonary disease. Electronically Signed   By: Kerby Moors M.D.   On: 09/16/2021 08:35   CT Angio Chest PE W and/or Wo Contrast  Result Date: 09/16/2021 CLINICAL DATA:  Shortness of breath EXAM: CT ANGIOGRAPHY CHEST WITH CONTRAST TECHNIQUE: Multidetector CT imaging of the chest was performed using the standard protocol during bolus administration of intravenous contrast. Multiplanar CT image reconstructions and MIPs were obtained to evaluate the vascular anatomy. CONTRAST:  32mL OMNIPAQUE IOHEXOL 350 MG/ML SOLN  COMPARISON:  Same day chest radiograph, CT chest 04/05/2021 FINDINGS: Cardiovascular: There is satisfactory opacification of the pulmonary arteries to the segmental level. There is no evidence of acute or chronic pulmonary embolism. The heart is not enlarged. There is no pericardial effusion. There is mild scattered coronary artery calcification and minimal calcified atherosclerotic plaque of the aortic arch. Mediastinum/Nodes: The imaged thyroid is unremarkable. The esophagus is grossly unremarkable. There is no mediastinal, hilar, or axillary lymphadenopathy. Lungs/Pleura: The trachea and central airways are patent. There is scattered debris in the trachea and central airways with areas of mucoid impaction distally. There is mild central bronchial wall thickening. There is a background of moderate centrilobular and paraseptal emphysema. Patchy opacities in the right middle lobe and lingula are similar to the prior CT and likely reflect scar. There is no new focal consolidation. There is no pulmonary edema. There is no pleural effusion or pneumothorax. Upper Abdomen: The imaged portions of the upper abdominal viscera are unremarkable. Musculoskeletal: There is no acute osseous abnormality or aggressive osseous lesion. Review of the MIP images confirms the above findings. IMPRESSION: 1. No evidence of pulmonary embolism. 2. Debris in the trachea and airways consistent with aspiration. 3. Background of moderate centrilobular and paraseptal emphysema, unchanged. Aortic Atherosclerosis (ICD10-I70.0) and Emphysema (ICD10-J43.9). Electronically Signed   By: Valetta Mole M.D.   On: 09/16/2021 11:38    ____________________________________________   PROCEDURES  Procedure(s) performed (including Critical Care):  .1-3 Lead EKG Interpretation Performed by: Vanessa [place], MD Authorized by: Vanessa [place], MD     Interpretation: normal     ECG rate:  60s   ECG rate assessment: normal     Rhythm: sinus rhythm      Ectopy: none     Conduction: normal   .Critical Care Performed by: Vanessa [place], MD Authorized by: Vanessa [place], MD   Critical care provider statement:    Critical care time (minutes):  45   Critical care was necessary to treat or prevent imminent or life-threatening deterioration of the following conditions:  Cardiac failure   Critical care was time spent personally by me on the following activities:  Discussions with consultants, evaluation of patient's response to treatment, examination of patient, ordering and performing treatments and interventions, ordering and review of laboratory studies, ordering and review of radiographic studies, pulse oximetry, re-evaluation of patient's condition, obtaining history from patient or surrogate and review of old charts   ____________________________________________   INITIAL IMPRESSION / ASSESSMENT AND PLAN / ED COURSE   Soham K Modica was evaluated in Emergency Department on 09/16/2021 for the symptoms described in the history of present illness. He was evaluated in the context of the global COVID-19 pandemic, which necessitated consideration that the patient might be at risk for  infection with the SARS-CoV-2 virus that causes COVID-19. Institutional protocols and algorithms that pertain to the evaluation of patients at risk for COVID-19 are in a state of rapid change based on information released by regulatory bodies including the CDC and federal and state organizations. These policies and algorithms were followed during the patient's care in the ED.     Pt presents with SOB in setting of known HIV.  Given patient's history of COPD do not hear a lot of wheezing at this time we will try 1 DuoNeb to see if that helps improve patient's symptoms   differential includes: PNA-will get xray to evaluation Anemia-CBC to evaluate ACS- will get trops Arrhythmia-Will get EKG and keep on monitor.  COVID- will get testing per algorithm. PE-consider if xray  negative   9:42 AM patient reporting some chronic back pain and he missed his MS Contin dose.  Issue with lab with the CMP analyzer being down.  Explained to patient I wanted to do a CT scan given x-rays negative and procalcitonin significantly elevated to make sure no evidence of pneumonia, cancer, pulmonary embolism or other acute pathology that is causing his shortness of breath and cough.  His abdomen is soft and nontender at this time.  He is not febrile is otherwise well-appearing.  Do not feel like he is septic.  We will give a dose of IV morphine to help with pain while the MS Contin is taking place given he is 2 hours past his dose.  12:00 PM CT scan is negative.  No chest pain.  Reports some continued shortness of breath when he tried to move.   It does show some concern for aspiration patient is afebrile.  Procalcitonin is elevated but is unclear exactly what that means and we will continue to monitor that at this time we will hold off on any antibiotics given no pneumonia on CT scan.  However patient's troponin did greatly elevate from 30 into the 500s the patient started on heparin for NSTEMI.  Patient was given a dose of aspirin.  We will discussed with the hospital team for admission                 ____________________________________________   FINAL CLINICAL IMPRESSION(S) / ED DIAGNOSES   Final diagnoses:  NSTEMI (non-ST elevated myocardial infarction) ([place])     MEDICATIONS GIVEN DURING THIS VISIT:  Medications  morphine (MS CONTIN) 12 hr tablet 30 mg (30 mg Oral Given 09/16/21 1036)  aspirin chewable tablet 324 mg (has no administration in time range)  ipratropium-albuterol (DUONEB) 0.5-2.5 (3) MG/3ML nebulizer solution 3 mL (3 mLs Nebulization Given 09/16/21 0804)  benzonatate (TESSALON) capsule 200 mg (200 mg Oral Given 09/16/21 0804)  morphine 4 MG/ML injection 4 mg (4 mg Intravenous Given 09/16/21 1034)  iohexol (OMNIPAQUE) 350 MG/ML injection 75 mL (75 mLs  Intravenous Contrast Given 09/16/21 1102)     ED Discharge Orders     None        Note:  This document was prepared using Dragon voice recognition software and may include unintentional dictation errors.   Vanessa [place], MD 09/16/21 1201    Vanessa [place], MD 09/16/21 [phone]

## 2021-09-16 NOTE — ED Notes (Signed)
Critical result called,troponin of 531. Provider notified at this time.

## 2021-09-16 NOTE — ED Notes (Signed)
Report called to Albert Wong in cath lab

## 2021-09-17 ENCOUNTER — Inpatient Hospital Stay (HOSPITAL_COMMUNITY)
Admit: 2021-09-17 | Discharge: 2021-09-17 | Disposition: A | Discharge status: Home / Self Care | Payer: Medicare Other | Attending: Cardiovascular Disease | Admitting: Cardiovascular Disease

## 2021-09-17 DIAGNOSIS — I420 Dilated cardiomyopathy: Secondary | ICD-10-CM

## 2021-09-17 DIAGNOSIS — I214 Non-ST elevation (NSTEMI) myocardial infarction: Secondary | ICD-10-CM | POA: Diagnosis not present

## 2021-09-17 DIAGNOSIS — Z720 Tobacco use: Secondary | ICD-10-CM | POA: Diagnosis not present

## 2021-09-17 DIAGNOSIS — B20 Human immunodeficiency virus [HIV] disease: Secondary | ICD-10-CM | POA: Diagnosis not present

## 2021-09-17 DIAGNOSIS — F419 Anxiety disorder, unspecified: Secondary | ICD-10-CM | POA: Diagnosis not present

## 2021-09-17 DIAGNOSIS — J432 Centrilobular emphysema: Secondary | ICD-10-CM | POA: Diagnosis not present

## 2021-09-17 DIAGNOSIS — I2510 Atherosclerotic heart disease of native coronary artery without angina pectoris: Secondary | ICD-10-CM | POA: Diagnosis not present

## 2021-09-17 DIAGNOSIS — E782 Mixed hyperlipidemia: Secondary | ICD-10-CM

## 2021-09-17 LAB — ECHOCARDIOGRAM COMPLETE
AR max vel: 2.7 cm2
AV Area VTI: 2.96 cm2
AV Area mean vel: 2.76 cm2
AV Mean grad: 1.5 mmHg
AV Peak grad: 2.8 mmHg
Ao pk vel: 0.84 m/s
Area-P 1/2: 2.39 cm2
Calc EF: 49.2 %
Height: 70 in
S' Lateral: 3.6 cm
Single Plane A2C EF: 41.9 %
Single Plane A4C EF: 57 %
Weight: 2656 oz

## 2021-09-17 LAB — CBC
HCT: 39.1 % (ref 39.0–52.0)
Hemoglobin: 13.7 g/dL (ref 13.0–17.0)
MCH: 36.7 pg — ABNORMAL HIGH (ref 26.0–34.0)
MCHC: 35 g/dL (ref 30.0–36.0)
MCV: 104.8 fL — ABNORMAL HIGH (ref 80.0–100.0)
Platelets: 205 10*3/uL (ref 150–400)
RBC: 3.73 MIL/uL — ABNORMAL LOW (ref 4.22–5.81)
RDW: 12.1 % (ref 11.5–15.5)
WBC: 5.9 10*3/uL (ref 4.0–10.5)
nRBC: 0 % (ref 0.0–0.2)

## 2021-09-17 LAB — BASIC METABOLIC PANEL
Anion gap: 8 (ref 5–15)
BUN: 10 mg/dL (ref 8–23)
CO2: 26 mmol/L (ref 22–32)
Calcium: 8.2 mg/dL — ABNORMAL LOW (ref 8.9–10.3)
Chloride: 106 mmol/L (ref 98–111)
Creatinine, Ser: 1.03 mg/dL (ref 0.61–1.24)
GFR, Estimated: >60 mL/min (ref >60)
Glucose, Bld: 79 mg/dL (ref 70–99)
Potassium: 3.7 mmol/L (ref 3.5–5.1)
Sodium: 140 mmol/L (ref 135–145)

## 2021-09-17 LAB — LIPID PANEL
Cholesterol: 138 mg/dL (ref 0–200)
HDL: 36 mg/dL — ABNORMAL LOW (ref >40)
LDL Cholesterol: 90 mg/dL (ref 0–99)
Total CHOL/HDL Ratio: 3.8 RATIO
Triglycerides: 61 mg/dL (ref <150)
VLDL: 12 mg/dL (ref 0–40)

## 2021-09-17 LAB — HEMOGLOBIN A1C
Hgb A1c MFr Bld: 5.5 % (ref 4.8–5.6)
Mean Plasma Glucose: 111 mg/dL

## 2021-09-17 MED ORDER — DM-GUAIFENESIN ER 30-600 MG PO TB12
1.0000 | ORAL_TABLET | Freq: Two times a day (BID) | ORAL | Status: DC
  Administered 2021-09-17: 1 via ORAL
  Filled 2021-09-17: qty 1

## 2021-09-17 MED ORDER — ROSUVASTATIN CALCIUM 10 MG PO TABS: 40.0000 mg | ORAL_TABLET | Freq: Every day | ORAL | Status: DC

## 2021-09-17 MED ORDER — FUROSEMIDE 40 MG PO TABS
40.0000 mg | ORAL_TABLET | Freq: Once | ORAL | Status: AC
  Administered 2021-09-17: 40 mg via ORAL
  Filled 2021-09-17: qty 1

## 2021-09-17 MED ORDER — METOPROLOL TARTRATE 25 MG PO TABS
12.5000 mg | ORAL_TABLET | Freq: Two times a day (BID) | ORAL | Status: DC
  Administered 2021-09-17: 12.5 mg via ORAL
  Filled 2021-09-17: qty 1

## 2021-09-17 MED ORDER — FUROSEMIDE 20 MG PO TABS: 20.0000 mg | ORAL_TABLET | Freq: Every day | ORAL | 0 refills | Status: DC

## 2021-09-17 MED ORDER — METOPROLOL SUCCINATE ER 25 MG PO TB24: 25.0000 mg | ORAL_TABLET | Freq: Every evening | ORAL | Status: DC

## 2021-09-17 MED ORDER — PERFLUTREN LIPID MICROSPHERE
1.0000 mL | INTRAVENOUS | Status: AC | PRN
  Administered 2021-09-17: 5 mL via INTRAVENOUS
  Filled 2021-09-17: qty 10

## 2021-09-17 MED ORDER — FUROSEMIDE 20 MG PO TABS: 20.0000 mg | ORAL_TABLET | Freq: Every day | ORAL | Status: DC

## 2021-09-17 MED ORDER — POTASSIUM CHLORIDE CRYS ER 20 MEQ PO TBCR
20.0000 meq | EXTENDED_RELEASE_TABLET | Freq: Every day | ORAL | Status: DC
  Administered 2021-09-17: 20 meq via ORAL
  Filled 2021-09-17: qty 2

## 2021-09-17 MED ORDER — INFLUENZA VAC A&B SA ADJ QUAD 0.5 ML IM PRSY: 0.5000 mL | PREFILLED_SYRINGE | INTRAMUSCULAR | Status: DC

## 2021-09-17 MED ORDER — DM-GUAIFENESIN ER 30-600 MG PO TB12: 1.0000 | ORAL_TABLET | Freq: Two times a day (BID) | ORAL | 0 refills | Status: DC

## 2021-09-17 MED ORDER — METOPROLOL SUCCINATE ER 25 MG PO TB24: 25.0000 mg | ORAL_TABLET | Freq: Every evening | ORAL | 1 refills | Status: DC

## 2021-09-17 MED ORDER — ASPIRIN 81 MG PO TBEC: 81.0000 mg | DELAYED_RELEASE_TABLET | Freq: Every day | ORAL | 2 refills | Status: AC

## 2021-09-17 MED ORDER — DAPAGLIFLOZIN PROPANEDIOL 10 MG PO TABS: 10.0000 mg | ORAL_TABLET | Freq: Every evening | ORAL | 2 refills | Status: DC

## 2021-09-17 MED ORDER — IPRATROPIUM-ALBUTEROL 0.5-2.5 (3) MG/3ML IN SOLN
3.0000 mL | Freq: Four times a day (QID) | RESPIRATORY_TRACT | Status: DC
  Administered 2021-09-17: 3 mL via RESPIRATORY_TRACT
  Filled 2021-09-17: qty 3

## 2021-09-17 MED ORDER — POTASSIUM CHLORIDE CRYS ER 20 MEQ PO TBCR: 20.0000 meq | EXTENDED_RELEASE_TABLET | Freq: Every day | ORAL | 0 refills | Status: DC

## 2021-09-17 MED ORDER — DAPAGLIFLOZIN PROPANEDIOL 5 MG PO TABS
10.0000 mg | ORAL_TABLET | Freq: Every evening | ORAL | Status: DC
  Filled 2021-09-17: qty 2

## 2021-09-17 MED ORDER — DICYCLOMINE HCL 20 MG PO TABS
20.0000 mg | ORAL_TABLET | Freq: Three times a day (TID) | ORAL | Status: DC
  Filled 2021-09-17 (×2): qty 1

## 2021-09-17 NOTE — Progress Notes (Signed)
Progress Note  Patient Name: NICOLAE VASEK Date of Encounter: 09/17/2021  [facility] Cardiologist: New  Subjective   LHC showed mild to moderate CAD with moderately reduced LVSF, EF 35-40%. Patient reports coughing but no significant shortness of breath. No chest pain.   Inpatient Medications    Scheduled Meds:  amLODipine  5 mg Oral Daily   aspirin EC  81 mg Oral Daily   cromolyn  1 drop Both Eyes QID   fluticasone furoate-vilanterol  1 puff Inhalation Daily   influenza vaccine adjuvanted  0.5 mL Intramuscular Tomorrow-1000   ipratropium-albuterol  3 mL Nebulization Q4H   lubiprostone  24 mcg Oral BID   melatonin  5 mg Oral QHS   morphine  30 mg Oral Q12H   nicotine  21 mg Transdermal Daily   pregabalin  50 mg Oral QHS   rosuvastatin  40 mg Oral Daily   sodium chloride flush  3 mL Intravenous Q12H   tamsulosin  0.4 mg Oral Daily   Continuous Infusions:  sodium chloride 75 mL/hr at 09/17/21 0130   sodium chloride     PRN Meds: sodium chloride, acetaminophen, albuterol, dextromethorphan-guaiFENesin, diazepam, hydrALAZINE, ipratropium-albuterol, morphine injection, nitroGLYCERIN, ondansetron (ZOFRAN) IV, sodium chloride flush   Vital Signs    Vitals:   09/16/21 2025 09/16/21 2346 09/17/21 0302 09/17/21 0735  BP: 113/64 122/70 (!) 90/59   Pulse: 74 71 62   Resp: 18 14 18    Temp: 98.5 F (36.9 C) 98.3 F (36.8 C) 98.3 F (36.8 C)   TempSrc: Oral Oral Oral   SpO2: 93% 92% 90% 90%  Weight:      Height:        Intake/Output Summary (Last 24 hours) at 09/17/2021 0807 Last data filed at 09/17/2021 0600 Gross per 24 hour  Intake 583.64 ml  Output --  Net 583.64 ml   Last 3 Weights 09/16/2021 09/01/2021 07/27/2021  Weight (lbs) 166 lb 166 lb 166 lb  Weight (kg) 75.297 kg 75.297 kg 75.297 kg      Telemetry    NSR HR 70 - Personally Reviewed  ECG    No new - Personally Reviewed  Physical Exam   GEN: No acute distress.   Neck: No JVD Cardiac: RRR, no  murmurs, rubs, or gallops.  Respiratory: Clear to auscultation bilaterally. GI: Soft, nontender, non-distended  MS: No edema; No deformity. Neuro:  Nonfocal  Psych: Normal affect   Labs    High Sensitivity Troponin:   Recent Labs  Lab 09/16/21 0748 09/16/21 1033 09/16/21 1300 09/16/21 1821 09/16/21 2131  TROPONINIHS 30* 531* 736* 580* 450*     Chemistry Recent Labs  Lab 09/16/21 0748  NA 141  K 3.6  CL 105  CO2 27  GLUCOSE 110*  BUN 9  CREATININE 1.08  CALCIUM 8.9  PROT 7.2  ALBUMIN 4.1  AST 21  ALT 8  ALKPHOS 35*  BILITOT 0.8  GFRNONAA >60  ANIONGAP 9    Lipids  Recent Labs  Lab 09/17/21 0607  CHOL 138  TRIG 61  HDL 36*  LDLCALC 90  CHOLHDL 3.8    Hematology Recent Labs  Lab 09/16/21 0748 09/17/21 0607  WBC 6.7 5.9  RBC 4.42 3.73*  HGB 16.0 13.7  HCT 46.9 39.1  MCV 106.1* 104.8*  MCH 36.2* 36.7*  MCHC 34.1 35.0  RDW 12.3 12.1  PLT 220 205   Thyroid No results for input(s): TSH, FREET4 in the last 168 hours.  BNP Recent Labs  Lab 09/16/21 0748  BNP 48.7    DDimer No results for input(s): DDIMER in the last 168 hours.   Radiology    DG Chest 2 View  Result Date: 09/16/2021 CLINICAL DATA:  Short of breath. EXAM: CHEST - 2 VIEW COMPARISON:  Nine/21/20 FINDINGS: The heart size and mediastinal contours are within normal limits. Both lungs are clear. The visualized skeletal structures are unremarkable. IMPRESSION: No active cardiopulmonary disease. Electronically Signed   By: Kerby Moors M.D.   On: 09/16/2021 08:35   CT Angio Chest PE W and/or Wo Contrast  Result Date: 09/16/2021 CLINICAL DATA:  Shortness of breath EXAM: CT ANGIOGRAPHY CHEST WITH CONTRAST TECHNIQUE: Multidetector CT imaging of the chest was performed using the standard protocol during bolus administration of intravenous contrast. Multiplanar CT image reconstructions and MIPs were obtained to evaluate the vascular anatomy. CONTRAST:  47mL OMNIPAQUE IOHEXOL 350 MG/ML SOLN  COMPARISON:  Same day chest radiograph, CT chest 04/05/2021 FINDINGS: Cardiovascular: There is satisfactory opacification of the pulmonary arteries to the segmental level. There is no evidence of acute or chronic pulmonary embolism. The heart is not enlarged. There is no pericardial effusion. There is mild scattered coronary artery calcification and minimal calcified atherosclerotic plaque of the aortic arch. Mediastinum/Nodes: The imaged thyroid is unremarkable. The esophagus is grossly unremarkable. There is no mediastinal, hilar, or axillary lymphadenopathy. Lungs/Pleura: The trachea and central airways are patent. There is scattered debris in the trachea and central airways with areas of mucoid impaction distally. There is mild central bronchial wall thickening. There is a background of moderate centrilobular and paraseptal emphysema. Patchy opacities in the right middle lobe and lingula are similar to the prior CT and likely reflect scar. There is no new focal consolidation. There is no pulmonary edema. There is no pleural effusion or pneumothorax. Upper Abdomen: The imaged portions of the upper abdominal viscera are unremarkable. Musculoskeletal: There is no acute osseous abnormality or aggressive osseous lesion. Review of the MIP images confirms the above findings. IMPRESSION: 1. No evidence of pulmonary embolism. 2. Debris in the trachea and airways consistent with aspiration. 3. Background of moderate centrilobular and paraseptal emphysema, unchanged. Aortic Atherosclerosis (ICD10-I70.0) and Emphysema (ICD10-J43.9). Electronically Signed   By: Valetta Mole M.D.   On: 09/16/2021 11:38   CARDIAC CATHETERIZATION  Result Date: 09/16/2021   Mid Cx lesion is 40% stenosed.   Mid RCA lesion is 30% stenosed.   Dist RCA lesion is 40% stenosed.   There is mild left ventricular systolic dysfunction.   LV end diastolic pressure is moderately elevated.   The left ventricular ejection fraction is 35-45% by visual  estimate. 1.  Mild to moderate nonobstructive coronary artery disease. 2.  Moderately reduced LV systolic function with wall motion abnormality suggestive of stress-induced cardiomyopathy.  Mildly elevated left ventricular end-diastolic pressure. Recommendations: Recommend medical therapy for nonobstructive coronary artery disease. Obtain an echocardiogram to better evaluate wall motion.    Cardiac Studies   Cardiac cath 09/16/21     Mid Cx lesion is 40% stenosed.   Mid RCA lesion is 30% stenosed.   Dist RCA lesion is 40% stenosed.   There is mild left ventricular systolic dysfunction.   LV end diastolic pressure is moderately elevated.   The left ventricular ejection fraction is 35-45% by visual estimate.   1.  Mild to moderate nonobstructive coronary artery disease. 2.  Moderately reduced LV systolic function with wall motion abnormality suggestive of stress-induced cardiomyopathy.  Mildly elevated left ventricular end-diastolic pressure.  Recommendations: Recommend medical therapy for nonobstructive coronary artery disease. Obtain an echocardiogram to better evaluate wall motion.  Echo ordered  Patient Profile     65 y.o. male with a hx of HIV disease diagnosed in early 22s, treated hepatitis, COPD, tobacco use who is being seen 09/16/2021 for the evaluation of shortness of breath and NSTEMI.  Assessment & Plan    NSTEMI - presented with SOB after being switched to new HIV medication. CXR and CTA chest unremarkable. HS Trop elevated to 531 and started on IV heparin. EKG with NSR and TWI aVL inversion. No chest pain reported - LHC showed mild to moderate nonobstructive CAD with moderately reduced LVSF, mildly elevated LVEDP. Recommend medical therapy - echo ordered - start Aspirin 81mg  daily  - stop amlodipine with low EF and start low dose lopressor - continue crestor - cath site stable. BMET pending - Patient denies chest pain  Cardiomyopathy - Cath showed LVSF 35-40% and  mild to mod nonobstructive CAD - echo ordered - Euvolemic on exam - Add GDMT as tolerated   HTN - stop amlodipine as above - start Lopressor 12.5mg BID - Bps soft this am   COPD/tobacco use - reports he smokes intermittently>>complete cessation recommended   HIV - ID consulted   HLD - PTA crestor 20mg  daily>>continue - LDL 90, goal<70 - will increase Crestor  For questions or updates, please contact [facility] Please consult [email or web address] for contact info under        Signed, Chizuko Trine Ninfa Meeker, PA-C  09/17/2021, 8:07 AM

## 2021-09-17 NOTE — Discharge Summary (Signed)
Physician Discharge Summary  Albert Wong OXB:[id number] DOB: 09-Mar-1956 DOA: 09/16/2021  PCP: Jodi Marble, MD  Admit date: 09/16/2021 Discharge date: 09/17/2021  Admitted From: home Disposition:  home  Recommendations for Outpatient Follow-up:  Follow up with PCP in 1-2 weeks Please obtain BMP/CBC in one week Please follow up with [place]: No  Equipment/Devices: None   Discharge Condition: Stable  CODE STATUS: Full  Diet recommendation: Heart Healthy     Discharge Diagnoses: Principal Problem:   NSTEMI (non-ST elevated myocardial infarction) ([place]) Active Problems:   H/O degenerative disc disease   Anxiety   HTN (hypertension)   HIV (human immunodeficiency virus infection) ([place])   HLD (hyperlipidemia)   Tobacco abuse   COPD (chronic obstructive pulmonary disease) ([place])    Summary of HPI and Hospital Course:  Per H&P by Dr. Blaine Hamper: "Albert Wong is a 65 y.o. male with medical history significant of hypertension, hyperlipidemia, COPD, HIV (CD4=570 on 01/01/13, and VL undetectable on 07/21/2021), HCV, insomnia, degenerative disc disease on MS Contin, tobacco abuse, anxiety, who presents with shortness of breath.   Pt states that he has shortness of breath for more than 3 days, particularly on exertion.  Patient has dry cough, denies chest pain.  No fever or chills.  Patient states that his symptoms started after he was switched to a new HIV medication.  Patient has nausea, no vomiting, diarrhea or abdominal pain, no symptoms of UTI.  No recent fall or head injury.  No rectal bleeding or dark stool.   ED Course: pt was found to have troponin level 3 --> 531, negative COVID PCR, BNP 48, electrolytes renal function okay, temperature normal, blood pressure 115/71, heart rate 87, RR 24, 10, oxygen saturation 90-100% on room air.  Chest x-ray negative.  CT angiogram is negative for PE.  Patient is admitted to progressive bed as inpatient.  Dr. Fletcher Anon of cardiology  is consulted."  Patient was taken for cardiac catheterization which showed: 1.  Mild to moderate nonobstructive coronary artery disease. 2.  Moderately reduced LV systolic function with wall motion abnormality suggestive of stress-induced cardiomyopathy.  Mildly elevated left ventricular end-diastolic pressure.  Medical management for nonobstructive CAD was recommended.    Echocardiogram showed reduced EF of 40-45% and grade 1 diastolic dysfunction, mild global hypokinesis, IVC dilated.  Cardiology's final assessment & plan at discharge: "Non-STEMI Catheterization with nonobstructive disease Dilated cardiomyopathy, etiology unclear Recent change to HIV medications, also very anxious person, unable to exclude stress cardiomyopathy Lives alone, difficulty managing a house by himself -Stress reduction techniques discussed -We will change amlodipine to metoprolol succinate 25 daily (dose limited secondary to hypotension).  Blood pressure worse on morphine for chronic back pain --Add Farxiga 10 mg daily ([place] not on formulary) -Lasix 40 mg x 1 now, with potassium Will discharge with Lasix 20 daily and potassium 20 daily No further cardiac work-up needed -May benefit from cardiac rehab, this can be arranged as outpatient"  Patient clinically improved and stable for discharge today, with outpatient follow up.     Discharge Instructions    Allergies as of 09/17/2021       Reactions   Dovato [dolutegravir-lamivudine] Other (See Comments)   Severe Flu like symptoms Sweating  Shortness of breath Body aches        Medication List     STOP taking these medications    amLODipine 5 MG tablet Commonly known as: NORVASC       TAKE these medications  aspirin 81 MG EC tablet Take 1 tablet (81 mg total) by mouth daily. Swallow whole. Start taking on: September 18, 2021   B-D 3CC LUER-LOK SYR 21GX1" 21G X 1" 3 ML Misc Generic drug: SYRINGE-NEEDLE (DISP) 3 ML USE AS  DIRECTED WITH TESTOSTERONE   baclofen 10 MG tablet Commonly known as: LIORESAL TAKE 1 TABLET BY MOUTH EVERY DAY   Breztri Aerosphere 160-9-4.8 MCG/ACT Aero Generic drug: Budeson-Glycopyrrol-Formoterol Inhale 2 puffs into the lungs 2 (two) times daily.   cromolyn 4 % ophthalmic solution Commonly known as: OPTICROM SMARTSIG:1-2 Drop(s) In Eye(s) 4-6 Times Daily   dapagliflozin propanediol 10 MG Tabs tablet Commonly known as: FARXIGA Take 1 tablet (10 mg total) by mouth every evening.   dextromethorphan-guaiFENesin 30-600 MG 12hr tablet Commonly known as: MUCINEX DM Take 1 tablet by mouth 2 (two) times daily.   diazepam 10 MG tablet Commonly known as: VALIUM Take 10 mg by mouth every 8 (eight) hours as needed for anxiety.   dicyclomine 20 MG tablet Commonly known as: BENTYL Take 20 mg by mouth 4 (four) times daily.   Dovato 50-300 MG Tabs Generic drug: Dolutegravir-lamiVUDine Take 1 tablet by mouth daily.   furosemide 20 MG tablet Commonly known as: LASIX Take 1 tablet (20 mg total) by mouth daily. Start taking on: September 18, 2021   Ipratropium-Albuterol 20-100 MCG/ACT Aers respimat Commonly known as: COMBIVENT Inhale into the lungs.   lubiprostone 24 MCG capsule Commonly known as: AMITIZA Take 24 mcg by mouth 2 (two) times daily.   Melatonin 5 MG Caps Take 1 capsule by mouth at bedtime.   metoprolol succinate 25 MG 24 hr tablet Commonly known as: TOPROL-XL Take 1 tablet (25 mg total) by mouth every evening.   morphine 30 MG 12 hr tablet Commonly known as: MS CONTIN Take 1 tablet (30 mg total) by mouth 2 (two) times daily.   potassium chloride SA 20 MEQ tablet Commonly known as: KLOR-CON Take 1 tablet (20 mEq total) by mouth daily.   pregabalin 50 MG capsule Commonly known as: LYRICA Take 50 mg by mouth at bedtime.   rosuvastatin 20 MG tablet Commonly known as: CRESTOR SMARTSIG:1 Tablet(s) By Mouth Every Evening   Symbicort 80-4.5 MCG/ACT  inhaler Generic drug: budesonide-formoterol Inhale 2 puffs into the lungs 2 (two) times daily.   tamsulosin 0.4 MG Caps capsule Commonly known as: FLOMAX TAKE 1 CAPSULE BY MOUTH ONCE DAILY   triamcinolone cream 0.1 % Commonly known as: KENALOG Apply topically 2 (two) times daily.        Allergies  Allergen Reactions   Dovato [Dolutegravir-Lamivudine] Other (See Comments)    Severe Flu like symptoms Sweating  Shortness of breath Body aches     If you experience worsening of your admission symptoms, develop shortness of breath, life threatening emergency, suicidal or homicidal thoughts you must seek medical attention immediately by calling 911 or calling your MD immediately  if symptoms less severe.    Please note   You were cared for by a hospitalist during your hospital stay. If you have any questions about your discharge medications or the care you received while you were in the hospital after you are discharged, you can call the unit and asked to speak with the hospitalist on call if the hospitalist that took care of you is not available. Once you are discharged, your primary care physician will handle any further medical issues. Please note that NO REFILLS for any discharge medications will be authorized once you are  discharged, as it is imperative that you return to your primary care physician (or establish a relationship with a primary care physician if you do not have one) for your aftercare needs so that they can reassess your need for medications and monitor your lab values.   Consultations: Cardiology    Procedures/Studies: DG Chest 2 View  Result Date: 09/16/2021 CLINICAL DATA:  Short of breath. EXAM: CHEST - 2 VIEW COMPARISON:  Nine/21/20 FINDINGS: The heart size and mediastinal contours are within normal limits. Both lungs are clear. The visualized skeletal structures are unremarkable. IMPRESSION: No active cardiopulmonary disease. Electronically Signed   By:  Kerby Moors M.D.   On: 09/16/2021 08:35   CT Angio Chest PE W and/or Wo Contrast  Result Date: 09/16/2021 CLINICAL DATA:  Shortness of breath EXAM: CT ANGIOGRAPHY CHEST WITH CONTRAST TECHNIQUE: Multidetector CT imaging of the chest was performed using the standard protocol during bolus administration of intravenous contrast. Multiplanar CT image reconstructions and MIPs were obtained to evaluate the vascular anatomy. CONTRAST:  52mL OMNIPAQUE IOHEXOL 350 MG/ML SOLN COMPARISON:  Same day chest radiograph, CT chest 04/05/2021 FINDINGS: Cardiovascular: There is satisfactory opacification of the pulmonary arteries to the segmental level. There is no evidence of acute or chronic pulmonary embolism. The heart is not enlarged. There is no pericardial effusion. There is mild scattered coronary artery calcification and minimal calcified atherosclerotic plaque of the aortic arch. Mediastinum/Nodes: The imaged thyroid is unremarkable. The esophagus is grossly unremarkable. There is no mediastinal, hilar, or axillary lymphadenopathy. Lungs/Pleura: The trachea and central airways are patent. There is scattered debris in the trachea and central airways with areas of mucoid impaction distally. There is mild central bronchial wall thickening. There is a background of moderate centrilobular and paraseptal emphysema. Patchy opacities in the right middle lobe and lingula are similar to the prior CT and likely reflect scar. There is no new focal consolidation. There is no pulmonary edema. There is no pleural effusion or pneumothorax. Upper Abdomen: The imaged portions of the upper abdominal viscera are unremarkable. Musculoskeletal: There is no acute osseous abnormality or aggressive osseous lesion. Review of the MIP images confirms the above findings. IMPRESSION: 1. No evidence of pulmonary embolism. 2. Debris in the trachea and airways consistent with aspiration. 3. Background of moderate centrilobular and paraseptal  emphysema, unchanged. Aortic Atherosclerosis (ICD10-I70.0) and Emphysema (ICD10-J43.9). Electronically Signed   By: Valetta Mole M.D.   On: 09/16/2021 11:38   CARDIAC CATHETERIZATION  Result Date: 09/16/2021   Mid Cx lesion is 40% stenosed.   Mid RCA lesion is 30% stenosed.   Dist RCA lesion is 40% stenosed.   There is mild left ventricular systolic dysfunction.   LV end diastolic pressure is moderately elevated.   The left ventricular ejection fraction is 35-45% by visual estimate. 1.  Mild to moderate nonobstructive coronary artery disease. 2.  Moderately reduced LV systolic function with wall motion abnormality suggestive of stress-induced cardiomyopathy.  Mildly elevated left ventricular end-diastolic pressure. Recommendations: Recommend medical therapy for nonobstructive coronary artery disease. Obtain an echocardiogram to better evaluate wall motion.   ECHOCARDIOGRAM COMPLETE  Result Date: 09/17/2021    ECHOCARDIOGRAM REPORT   Patient Name:   Albert Wong Date of Exam: 09/17/2021 Medical Rec #:  [id number]     Height:       70.0 in Accession #:    [id number]    Weight:       166.0 lb Date of Birth:  Jan 25, 1956  BSA:          1.928 m Patient Age:    65 years      BP:           105/60 mmHg Patient Gender: M             HR:           69 bpm. Exam Location:  [facility] Procedure: 2D Echo, Color Doppler, Cardiac Doppler and Intracardiac            Opacification Agent Indications:     I42.0 Dilated cardiomyopathy                  NSTEMI  History:         Patient has no prior history of Echocardiogram examinations.                  Cardiomyopathy, Angina, Signs/Symptoms:Shortness of Breath;                  Risk Factors:HIV.  Sonographer:     Wallace Keller Thornton-Maynard Referring Phys:  [id number] A ARIDA Diagnosing Phys: Ida Rogue MD IMPRESSIONS  1. Left ventricular ejection fraction, by estimation, is 40 to 45%. The left ventricle has mildly decreased function. The left ventricle demonstrates global  hypokinesis. Left ventricular diastolic parameters are consistent with Grade I diastolic dysfunction (impaired relaxation).  2. Right ventricular systolic function is mildly reduced. The right ventricular size is mildly enlarged. Tricuspid regurgitation signal is inadequate for assessing PA pressure.  3. The inferior vena cava is dilated in size with >50% respiratory variability, suggesting right atrial pressure of 8 mmHg. FINDINGS  Left Ventricle: Left ventricular ejection fraction, by estimation, is 40 to 45%. The left ventricle has mildly decreased function. The left ventricle demonstrates global hypokinesis. The left ventricular internal cavity size was normal in size. There is  no left ventricular hypertrophy. Left ventricular diastolic parameters are consistent with Grade I diastolic dysfunction (impaired relaxation). Right Ventricle: The right ventricular size is mildly enlarged. No increase in right ventricular wall thickness. Right ventricular systolic function is mildly reduced. Tricuspid regurgitation signal is inadequate for assessing PA pressure. Left Atrium: Left atrial size was normal in size. Right Atrium: Right atrial size was normal in size. Pericardium: There is no evidence of pericardial effusion. Mitral Valve: The mitral valve is normal in structure. No evidence of mitral valve regurgitation. No evidence of mitral valve stenosis. Tricuspid Valve: The tricuspid valve is normal in structure. Tricuspid valve regurgitation is not demonstrated. No evidence of tricuspid stenosis. Aortic Valve: The aortic valve is normal in structure. Aortic valve regurgitation is not visualized. Mild aortic valve sclerosis is present, with no evidence of aortic valve stenosis. Aortic valve mean gradient measures 1.5 mmHg. Aortic valve peak gradient measures 2.8 mmHg. Aortic valve area, by VTI measures 2.96 cm. Pulmonic Valve: The pulmonic valve was normal in structure. Pulmonic valve regurgitation is not visualized.  No evidence of pulmonic stenosis. Aorta: The aortic root is normal in size and structure. Venous: The inferior vena cava is dilated in size with greater than 50% respiratory variability, suggesting right atrial pressure of 8 mmHg. IAS/Shunts: No atrial level shunt detected by color flow Doppler.  LEFT VENTRICLE PLAX 2D LVIDd:         4.30 cm     Diastology LVIDs:         3.60 cm     LV e' medial:    6.31 cm/s LV PW:  0.80 cm     LV E/e' medial:  6.2 LV IVS:        1.10 cm     LV e' lateral:   4.79 cm/s LVOT diam:     2.30 cm     LV E/e' lateral: 8.2 LV SV:         55 LV SV Index:   28 LVOT Area:     4.15 cm  LV Volumes (MOD) LV vol d, MOD A2C: 74.7 ml LV vol d, MOD A4C: 94.8 ml LV vol s, MOD A2C: 43.4 ml LV vol s, MOD A4C: 40.8 ml LV SV MOD A2C:     31.3 ml LV SV MOD A4C:     94.8 ml LV SV MOD BP:      41.7 ml RIGHT VENTRICLE RV S prime:     10.10 cm/s TAPSE (M-mode): 2.0 cm LEFT ATRIUM             Index LA diam:        3.30 cm 1.71 cm/m LA Vol (A2C):   37.7 ml 19.55 ml/m LA Vol (A4C):   24.5 ml 12.70 ml/m LA Biplane Vol: 31.0 ml 16.08 ml/m  AORTIC VALVE                   PULMONIC VALVE AV Area (Vmax):    2.70 cm    PV Vmax:       0.74 m/s AV Area (Vmean):   2.76 cm    PV Peak grad:  2.2 mmHg AV Area (VTI):     2.96 cm AV Vmax:           84.35 cm/s AV Vmean:          61.800 cm/s AV VTI:            0.185 m AV Peak Grad:      2.8 mmHg AV Mean Grad:      1.5 mmHg LVOT Vmax:         54.80 cm/s LVOT Vmean:        41.000 cm/s LVOT VTI:          0.132 m LVOT/AV VTI ratio: 0.71  AORTA Ao Root diam: 3.50 cm MITRAL VALVE MV Area (PHT): 2.39 cm    SHUNTS MV Decel Time: 317 msec    Systemic VTI:  0.13 m MV E velocity: 39.40 cm/s  Systemic Diam: 2.30 cm MV A velocity: 48.80 cm/s MV E/A ratio:  0.81 Ida Rogue MD Electronically signed by Ida Rogue MD Signature Date/Time: 09/17/2021/1:31:31 PM    Final      Left heart cath Echocardiogram   Subjective: Pt feeling okay this AM.  Not having chest pain  or SOB.  Requests home pain medications be resumed.  Otherwise denies acute complaints.     Discharge Exam: Vitals:   09/17/21 1114 09/17/21 1350  BP: 122/66   Pulse: 70   Resp: 16   Temp: 97.9 F (36.6 C)   SpO2: 94% 90%   Vitals:   09/17/21 0800 09/17/21 0834 09/17/21 1114 09/17/21 1350  BP: 122/70 103/68 122/66   Pulse: 69 71 70   Resp: 19 16 16    Temp: 98.2 F (36.8 C) 97.8 F (36.6 C) 97.9 F (36.6 C)   TempSrc:  Oral    SpO2: 91% 93% 94% 90%  Weight:      Height:        General: Pt is alert, awake, not in acute distress, chronically  ill appearing Cardiovascular: RRR, S1/S2 +, no rubs, no gallops Respiratory: CTA bilaterally, no wheezing, no rhonchi Abdominal: Soft, NT, ND, bowel sounds + Extremities: no edema, no cyanosis    The results of significant diagnostics from this hospitalization (including imaging, microbiology, ancillary and laboratory) are listed below for reference.     Microbiology: Recent Results (from the past 240 hour(s))  Resp Panel by RT-PCR (Flu A&B, Covid) Nasopharyngeal Swab     Status: None   Collection Time: 09/16/21  7:49 AM   Specimen: Nasopharyngeal Swab; Nasopharyngeal(NP) swabs in vial transport medium  Result Value Ref Range Status   SARS Coronavirus 2 by RT PCR NEGATIVE NEGATIVE Final    Comment: (NOTE) SARS-CoV-2 target nucleic acids are NOT DETECTED.  The SARS-CoV-2 RNA is generally detectable in upper respiratory specimens during the acute phase of infection. The lowest concentration of SARS-CoV-2 viral copies this assay can detect is 138 copies/mL. A negative result does not preclude SARS-Cov-2 infection and should not be used as the sole basis for treatment or other patient management decisions. A negative result may occur with  improper specimen collection/handling, submission of specimen other than nasopharyngeal swab, presence of viral mutation(s) within the areas targeted by this assay, and inadequate number of  viral copies(<138 copies/mL). A negative result must be combined with clinical observations, patient history, and epidemiological information. The expected result is Negative.  Fact Sheet for Patients:  EntrepreneurPulse.com.au  Fact Sheet for Healthcare Providers:  IncredibleEmployment.be  This test is no t yet approved or cleared by the [place] FDA and  has been authorized for detection and/or diagnosis of SARS-CoV-2 by FDA under an Emergency Use Authorization (EUA). This EUA will remain  in effect (meaning this test can be used) for the duration of the COVID-19 declaration under Section 564(b)(1) of the Act, 21 U.S.C.section 360bbb-3(b)(1), unless the authorization is terminated  or revoked sooner.       Influenza A by PCR NEGATIVE NEGATIVE Final   Influenza B by PCR NEGATIVE NEGATIVE Final    Comment: (NOTE) The Xpert Xpress SARS-CoV-2/FLU/RSV plus assay is intended as an aid in the diagnosis of influenza from Nasopharyngeal swab specimens and should not be used as a sole basis for treatment. Nasal washings and aspirates are unacceptable for Xpert Xpress SARS-CoV-2/FLU/RSV testing.  Fact Sheet for Patients: EntrepreneurPulse.com.au  Fact Sheet for Healthcare Providers: IncredibleEmployment.be  This test is not yet approved or cleared by the [place] FDA and has been authorized for detection and/or diagnosis of SARS-CoV-2 by FDA under an Emergency Use Authorization (EUA). This EUA will remain in effect (meaning this test can be used) for the duration of the COVID-19 declaration under Section 564(b)(1) of the Act, 21 U.S.C. section 360bbb-3(b)(1), unless the authorization is terminated or revoked.  Performed at [facility], [address] [place], [place] [address]      Labs: BNP (last 3 results) Recent Labs    09/16/21 0748  BNP 03.2   Basic Metabolic Panel: Recent  Labs  Lab 09/16/21 0748 09/17/21 0607  NA 141 140  K 3.6 3.7  CL 105 106  CO2 27 26  GLUCOSE 110* 79  BUN 9 10  CREATININE 1.08 1.03  CALCIUM 8.9 8.2*   Liver Function Tests: Recent Labs  Lab 09/16/21 0748  AST 21  ALT 8  ALKPHOS 35*  BILITOT 0.8  PROT 7.2  ALBUMIN 4.1   No results for input(s): LIPASE, AMYLASE in the last 168 hours. No results for input(s): AMMONIA  in the last 168 hours. CBC: Recent Labs  Lab 09/16/21 0748 09/17/21 0607  WBC 6.7 5.9  NEUTROABS 4.1  --   HGB 16.0 13.7  HCT 46.9 39.1  MCV 106.1* 104.8*  PLT 220 205   Cardiac Enzymes: No results for input(s): CKTOTAL, CKMB, CKMBINDEX, TROPONINI in the last 168 hours. BNP: Invalid input(s): POCBNP CBG: No results for input(s): GLUCAP in the last 168 hours. D-Dimer No results for input(s): DDIMER in the last 72 hours. Hgb A1c Recent Labs    09/16/21 1211  HGBA1C 5.5   Lipid Profile Recent Labs    09/17/21 0607  CHOL 138  HDL 36*  LDLCALC 90  TRIG 61  CHOLHDL 3.8   Thyroid function studies No results for input(s): TSH, T4TOTAL, T3FREE, THYROIDAB in the last 72 hours.  Invalid input(s): FREET3 Anemia work up No results for input(s): VITAMINB12, FOLATE, FERRITIN, TIBC, IRON, RETICCTPCT in the last 72 hours. Urinalysis    Component Value Date/Time   COLORURINE Straw 07/23/2013 1732   APPEARANCEUR Clear 07/27/2021 1534   LABSPEC 1.005 07/23/2013 1732   PHURINE 7.0 07/23/2013 1732   GLUCOSEU Negative 07/27/2021 1534   GLUCOSEU Negative 07/23/2013 1732   HGBUR Negative 07/23/2013 1732   BILIRUBINUR Negative 07/27/2021 1534   BILIRUBINUR Negative 07/23/2013 1732   KETONESUR Negative 07/23/2013 1732   PROTEINUR Negative 07/27/2021 1534   PROTEINUR Negative 07/23/2013 1732   NITRITE Negative 07/27/2021 1534   NITRITE Negative 07/23/2013 1732   LEUKOCYTESUR Negative 07/27/2021 1534   LEUKOCYTESUR Negative 07/23/2013 1732   Sepsis Labs Invalid input(s): PROCALCITONIN,  WBC,   LACTICIDVEN Microbiology Recent Results (from the past 240 hour(s))  Resp Panel by RT-PCR (Flu A&B, Covid) Nasopharyngeal Swab     Status: None   Collection Time: 09/16/21  7:49 AM   Specimen: Nasopharyngeal Swab; Nasopharyngeal(NP) swabs in vial transport medium  Result Value Ref Range Status   SARS Coronavirus 2 by RT PCR NEGATIVE NEGATIVE Final    Comment: (NOTE) SARS-CoV-2 target nucleic acids are NOT DETECTED.  The SARS-CoV-2 RNA is generally detectable in upper respiratory specimens during the acute phase of infection. The lowest concentration of SARS-CoV-2 viral copies this assay can detect is 138 copies/mL. A negative result does not preclude SARS-Cov-2 infection and should not be used as the sole basis for treatment or other patient management decisions. A negative result may occur with  improper specimen collection/handling, submission of specimen other than nasopharyngeal swab, presence of viral mutation(s) within the areas targeted by this assay, and inadequate number of viral copies(<138 copies/mL). A negative result must be combined with clinical observations, patient history, and epidemiological information. The expected result is Negative.  Fact Sheet for Patients:  EntrepreneurPulse.com.au  Fact Sheet for Healthcare Providers:  IncredibleEmployment.be  This test is no t yet approved or cleared by the [place] FDA and  has been authorized for detection and/or diagnosis of SARS-CoV-2 by FDA under an Emergency Use Authorization (EUA). This EUA will remain  in effect (meaning this test can be used) for the duration of the COVID-19 declaration under Section 564(b)(1) of the Act, 21 U.S.C.section 360bbb-3(b)(1), unless the authorization is terminated  or revoked sooner.       Influenza A by PCR NEGATIVE NEGATIVE Final   Influenza B by PCR NEGATIVE NEGATIVE Final    Comment: (NOTE) The Xpert Xpress SARS-CoV-2/FLU/RSV plus  assay is intended as an aid in the diagnosis of influenza from Nasopharyngeal swab specimens and should not be used as a sole  basis for treatment. Nasal washings and aspirates are unacceptable for Xpert Xpress SARS-CoV-2/FLU/RSV testing.  Fact Sheet for Patients: EntrepreneurPulse.com.au  Fact Sheet for Healthcare Providers: IncredibleEmployment.be  This test is not yet approved or cleared by the [place] FDA and has been authorized for detection and/or diagnosis of SARS-CoV-2 by FDA under an Emergency Use Authorization (EUA). This EUA will remain in effect (meaning this test can be used) for the duration of the COVID-19 declaration under Section 564(b)(1) of the Act, 21 U.S.C. section 360bbb-3(b)(1), unless the authorization is terminated or revoked.  Performed at [facility], [address] [place], [place] [address]      Time coordinating discharge: Over 30 minutes  SIGNED:   Ezekiel Slocumb, DO [facility] 09/17/2021, 2:16 PM   If 7PM-7AM, please contact night-coverage [email or web address]

## 2021-09-17 NOTE — Care Management CC44 (Signed)
Condition Code 44 Documentation Completed  Patient Details  Name: AQUILES RUFFINI MRN: [id number] Date of Birth: 1956-09-26   Condition Code 44 given:  Yes Patient signature on Condition Code 44 notice:  Yes Documentation of 2 MD's agreement:  Yes Code 44 added to claim:  Yes  Reviewed with patient via phone. Printed copy for patient's records.    Ellason Segar E Iyani Dresner, LCSW 09/17/2021, 2:32 PM

## 2021-09-19 ENCOUNTER — Encounter: Payer: Self-pay | Admitting: Cardiovascular Disease

## 2021-09-19 ENCOUNTER — Telehealth: Payer: Self-pay | Admitting: Medical

## 2021-09-19 NOTE — Telephone Encounter (Signed)
L MOM TO SCHEDULE TCM APPOINTMENT

## 2021-09-28 NOTE — Progress Notes (Signed)
Cardiology Office Note:    Date:  09/29/2021   ID:  Albert Wong, DOB [birth year], MRN [id number]  PCP:  Jodi Marble, MD  [facility] Cardiologist:  Dr. Darlis [facility] Electrophysiologist:  None   Referring MD: Jodi Marble, MD   Chief Complaint: Hospital follow-up  History of Present Illness:    Albert Wong is a 65 y.o. male with a hx of HIV disease diagnosed in early 66s, treated hepatitis, COPD, tobacco use who is being seen for hospital follow-up.   Patient came into the [facility] ER 09/16/21 for shortness of breath. Patient's HIV medications were recently switched to [place].Hst troponin was elevated. LHC showed mild to moderate nonobstructive CAD with moderately reduced LVSF, mildly elevated LVEDP. Started on Aspirin, Lopressor, crestor, jardiance, lasix 20mg  daily. Echo showed LVEF 40-45%, g1dd.   Today, the patient reports he has been taking it easy since the discharge. Albert Wong he was checking side affects of his new medications, and one was trouble with vision. Said he felt the sun was very bright, however this resolved. He has been tolerating medications well. No chest pain or SOB. No LLE, othopnea, pnd. Cath site stable, right wrist is stable.   Past Medical History:  Diagnosis Date   Anxiety    Chronic nausea    Degenerative disk disease    HIV (human immunodeficiency virus infection) ([place])    HIV (human immunodeficiency virus infection) ([place])    Insomnia    Muscle spasm    Rotator cuff tear     Past Surgical History:  Procedure Laterality Date   dental procedure N/A    LEFT HEART CATH AND CORONARY ANGIOGRAPHY N/A 09/16/2021   Procedure: LEFT HEART CATH AND CORONARY ANGIOGRAPHY;  Surgeon: Wellington Hampshire, MD;  Location: [place] [facility];  Service: Cardiovascular;  Laterality: N/A;    Current Medications: Current Meds  Medication Sig   aspirin EC 81 MG EC tablet Take 1 tablet (81 mg total) by mouth daily. Swallow whole.   B-D 3CC LUER-LOK SYR  21GX1" 21G X 1" 3 ML MISC USE AS DIRECTED WITH TESTOSTERONE   baclofen (LIORESAL) 10 MG tablet TAKE 1 TABLET BY MOUTH EVERY DAY   BREZTRI AEROSPHERE 160-9-4.8 MCG/ACT AERO Inhale 2 puffs into the lungs 2 (two) times daily.   cromolyn (OPTICROM) 4 % ophthalmic solution SMARTSIG:1-2 Drop(s) In Eye(s) 4-6 Times Daily   dapagliflozin propanediol (FARXIGA) 10 MG TABS tablet Take 1 tablet (10 mg total) by mouth every evening.   dextromethorphan-guaiFENesin (MUCINEX DM) 30-600 MG 12hr tablet Take 1 tablet by mouth 2 (two) times daily.   diazepam (VALIUM) 10 MG tablet Take 10 mg by mouth every 8 (eight) hours as needed for anxiety.   dicyclomine (BENTYL) 20 MG tablet Take 20 mg by mouth 4 (four) times daily.   Dolutegravir-lamiVUDine (DOVATO) 50-300 MG TABS Take 1 tablet by mouth daily.   furosemide (LASIX) 20 MG tablet Take 1 tablet (20 mg total) by mouth daily.   Ipratropium-Albuterol (COMBIVENT) 20-100 MCG/ACT AERS respimat Inhale into the lungs.   lubiprostone (AMITIZA) 24 MCG capsule Take 24 mcg by mouth 2 (two) times daily.   Melatonin 5 MG CAPS Take 1 capsule by mouth at bedtime.   metoprolol succinate (TOPROL-XL) 25 MG 24 hr tablet Take 1 tablet (25 mg total) by mouth every evening.   morphine (MS CONTIN) 30 MG 12 hr tablet Take 1 tablet (30 mg total) by mouth 2 (two) times daily.   potassium chloride SA (  KLOR-CON) 20 MEQ tablet Take 1 tablet (20 mEq total) by mouth daily.   pregabalin (LYRICA) 50 MG capsule Take 50 mg by mouth at bedtime.   rosuvastatin (CRESTOR) 20 MG tablet SMARTSIG:1 Tablet(s) By Mouth Every Evening   SYMBICORT 80-4.5 MCG/ACT inhaler Inhale 2 puffs into the lungs 2 (two) times daily.   tamsulosin (FLOMAX) 0.4 MG CAPS capsule TAKE 1 CAPSULE BY MOUTH ONCE DAILY   triamcinolone cream (KENALOG) 0.1 % Apply topically 2 (two) times daily.     Allergies:   Dovato [dolutegravir-lamivudine]   Social History   Socioeconomic History   Marital status: Single    Spouse name: Not  on file   Number of children: Not on file   Years of education: Not on file   Highest education level: Not on file  Occupational History   Not on file  Tobacco Use   Smoking status: Some Days    Packs/day: 1.00    Years: 39.00    Pack years: 39.00    Types: Cigarettes   Smokeless tobacco: Never   Tobacco comments:    Wears patches. Smokes about 6 cigarettes a week.  Substance and Sexual Activity   Alcohol use: Yes   Drug use: No   Sexual activity: Not on file  Other Topics Concern   Not on file  Social History Narrative   Not on file   Social Determinants of Health   Financial Resource Strain: Not on file  Food Insecurity: Not on file  Transportation Needs: Not on file  Physical Activity: Not on file  Stress: Not on file  Social Connections: Not on file     Family History: The patient's family history includes Heart disease in his father.  ROS:   Please see the history of present illness.     All other systems reviewed and are negative.  EKGs/Labs/Other Studies Reviewed:    The following studies were reviewed today:  Echo 09/17/21  1. Left ventricular ejection fraction, by estimation, is 40 to 45%. The  left ventricle has mildly decreased function. The left ventricle  demonstrates global hypokinesis. Left ventricular diastolic parameters are  consistent with Grade I diastolic  dysfunction (impaired relaxation).   2. Right ventricular systolic function is mildly reduced. The right  ventricular size is mildly enlarged. Tricuspid regurgitation signal is  inadequate for assessing PA pressure.   3. The inferior vena cava is dilated in size with >50% respiratory  variability, suggesting right atrial pressure of 8 mmHg.   Cardiac cath 09/16/21   Mid Cx lesion is 40% stenosed.   Mid RCA lesion is 30% stenosed.   Dist RCA lesion is 40% stenosed.   There is mild left ventricular systolic dysfunction.   LV end diastolic pressure is moderately elevated.   The left  ventricular ejection fraction is 35-45% by visual estimate.   1.  Mild to moderate nonobstructive coronary artery disease. 2.  Moderately reduced LV systolic function with wall motion abnormality suggestive of stress-induced cardiomyopathy.  Mildly elevated left ventricular end-diastolic pressure.   Recommendations: Recommend medical therapy for nonobstructive coronary artery disease. Obtain an echocardiogram to better evaluate wall motion.    EKG:  EKG is  ordered today.  The ekg ordered today demonstrates NSR 71bpm, nonspecific T wave changes  Recent Labs: 07/21/2021: TSH 2.517 09/16/2021: ALT 8; B Natriuretic Peptide 48.7 09/17/2021: BUN 10; Creatinine, Ser 1.03; Hemoglobin 13.7; Platelets 205; Potassium 3.7; Sodium 140  Recent Lipid Panel    Component Value Date/Time  CHOL 138 09/17/2021 0607   TRIG 61 09/17/2021 0607   HDL 36 (L) 09/17/2021 0607   CHOLHDL 3.8 09/17/2021 0607   VLDL 12 09/17/2021 0607   LDLCALC 90 09/17/2021 0607    Physical Exam:    VS:  BP 120/62 (BP Location: Left Arm, Patient Position: Sitting, Cuff Size: Normal)   Pulse 71   Ht 5\' 10"  (1.778 m)   Wt 160 lb (72.6 kg)   SpO2 94%   BMI 22.96 kg/m     Wt Readings from Last 3 Encounters:  09/29/21 160 lb (72.6 kg)  09/16/21 166 lb (75.3 kg)  09/01/21 166 lb (75.3 kg)     GEN:  Well nourished, well developed in no acute distress HEENT: Normal NECK: No JVD; No carotid bruits LYMPHATICS: No lymphadenopathy CARDIAC: RRR, no murmurs, rubs, gallops RESPIRATORY:  Clear to auscultation without rales, wheezing or rhonchi  ABDOMEN: Soft, non-tender, non-distended MUSCULOSKELETAL:  No edema; No deformity  SKIN: Warm and dry NEUROLOGIC:  Alert and oriented x 3 PSYCHIATRIC:  Normal affect   ASSESSMENT:    1. Coronary artery disease involving native coronary artery of native heart without angina pectoris   2. Chronic diastolic heart failure ([place])   3. Nonischemic cardiomyopathy ([place])   4.  Hyperlipidemia, mixed   5. Essential hypertension   6. Tobacco use   7. Chronic obstructive pulmonary disease, unspecified COPD type ([place])   8. HIV infection, unspecified symptom status ([place])    PLAN:    In order of problems listed above:  NSTEMI Nonobstructive CAD Recent hospitalization for NSTEMI, HS trop elevated but no chest pain reported. Cath showed nonobstructive CAD (report above). Echo showed EF 40-45%. Cath site right wrist stable. He denies anginal symptoms. Continue Aspirin, statin, BB. Will refer to cardiac rehab.  HFrEF NICM Echo showed LVEF 40-45%. Cardiac cath with nonobstructive CAD. Check BMET today with initiation of Jardiance and lasix at discharge. He is euvolemic on exam. Add Entresto 24-26mg BID. BMET in week. Continue toprol and Jardiance. CHF education discussed. Continue GDMT at follow-up.   HTN BP good today. Add Entresto as above. Continue Toprol.   HLD LDL 90 08/2021. Continue statin. Re-check lipid panel/LFTs in 6-8 weeks  Smoker/COPD Patient still smoking, but rarely  HIV Followed by ID  Disposition: Follow up in 1 month(s) with MD/APP    Signed, Zamira Hickam Ninfa Meeker, PA-C  09/29/2021 2:13 PM    [facility]

## 2021-09-29 ENCOUNTER — Other Ambulatory Visit: Payer: Self-pay

## 2021-09-29 ENCOUNTER — Ambulatory Visit (INDEPENDENT_AMBULATORY_CARE_PROVIDER_SITE_OTHER): Payer: Medicare Other | Admitting: Medical

## 2021-09-29 ENCOUNTER — Encounter: Payer: Self-pay | Admitting: Medical

## 2021-09-29 VITALS — BP 120/62 | HR 71 | Ht 70.0 in | Wt 160.0 lb

## 2021-09-29 DIAGNOSIS — E782 Mixed hyperlipidemia: Secondary | ICD-10-CM | POA: Diagnosis not present

## 2021-09-29 DIAGNOSIS — I10 Essential (primary) hypertension: Secondary | ICD-10-CM

## 2021-09-29 DIAGNOSIS — Z79899 Other long term (current) drug therapy: Secondary | ICD-10-CM | POA: Diagnosis not present

## 2021-09-29 DIAGNOSIS — I5032 Chronic diastolic (congestive) heart failure: Secondary | ICD-10-CM | POA: Diagnosis not present

## 2021-09-29 DIAGNOSIS — B20 Human immunodeficiency virus [HIV] disease: Secondary | ICD-10-CM

## 2021-09-29 DIAGNOSIS — J449 Chronic obstructive pulmonary disease, unspecified: Secondary | ICD-10-CM | POA: Diagnosis not present

## 2021-09-29 DIAGNOSIS — Z720 Tobacco use: Secondary | ICD-10-CM

## 2021-09-29 DIAGNOSIS — I428 Other cardiomyopathies: Secondary | ICD-10-CM | POA: Diagnosis not present

## 2021-09-29 DIAGNOSIS — I2510 Atherosclerotic heart disease of native coronary artery without angina pectoris: Secondary | ICD-10-CM | POA: Diagnosis not present

## 2021-09-29 MED ORDER — ENTRESTO 24-26 MG PO TABS: 1.0000 | ORAL_TABLET | Freq: Two times a day (BID) | ORAL | 6 refills | Status: DC

## 2021-09-29 NOTE — Patient Instructions (Addendum)
Medication Instructions:  - Your physician has recommended you make the following change in your medication:   1) START Entresto 24/26 mg: - take 1 tablet by mouth TWICE daily   *If you need a refill on your cardiac medications before your next appointment, please call your pharmacy*   Lab Work: - Your physician recommends that you have lab work today:  [place] physician recommends that you return for lab work in: 1 week- BMP   If you have labs (blood work) drawn today and your tests are completely normal, you will receive your results only by: [place] (if you have [place]) OR A paper copy in the mail If you have any lab test that is abnormal or we need to change your treatment, we will call you to review the results.   Testing/Procedures: None ordered today   Follow-Up: At [facility], you and your health needs are our priority.  As part of our continuing mission to provide you with exceptional heart care, we have created designated Provider Care Teams.  These Care Teams include your primary Cardiologist (physician) and Advanced Practice Providers (APPs -  Physician Assistants and Nurse Practitioners) who all work together to provide you with the care you need, when you need it.  We recommend signing up for the patient portal called "MyChart".  Sign up information is provided on this After Visit Summary.  MyChart is used to connect with patients for Virtual Visits (Telemedicine).  Patients are able to view lab/test results, encounter notes, upcoming appointments, etc.  Non-urgent messages can be sent to your provider as well.   To learn more about what you can do with MyChart, go to [email or web address].    Your next appointment:   1 month(s)  The format for your next appointment:   In Person  Provider:   You will see one of the following Advanced Practice Providers on your designated Care Team:   Murray Hodgkins, NP Christell Faith, PA-C Marrianne Mood,  PA-C Cadence Kathlen Mody, [place]     Other Instructions  ENTRESTO (Sacubitril; Valsartan) Oral Tablets What is this medication? SACUBITRIL; VALSARTAN (sak UE bi tril; val SAR tan) is a combination of a neprilysin inhibitor and a an angiotensin II receptor blocker. It treats heart failure. This medicine may be used for other purposes; ask your health care provider or pharmacist if you have questions. COMMON BRAND NAME(S): Entresto What should I tell my care team before I take this medication? They need to know if you have any of these conditions: diabetes and take a medicine that contains aliskiren high levels of potassium in the blood kidney disease liver disease low blood pressure an unusual or allergic reaction to sacubitril; valsartan, drugs called angiotensin converting enzyme (ACE) inhibitors, angiotensin II receptor blockers (ARBs), other medicines, foods, dyes, or preservatives pregnant or trying to get pregnant breast-feeding How should I use this medication? Take this medicine by mouth. Take it as directed on the prescription label at the same time every day. You can take it with or without food. If it upsets your stomach, take it with food. Keep taking it unless your health care provider tells you to stop. Talk to your health care provider about the use of this drug in children. While it may be prescribed for children as young as 1 for selected conditions, precautions do apply. Overdosage: If you think you have taken too much of this medicine contact a poison control center or emergency room at once. NOTE:  This medicine is only for you. Do not share this medicine with others. What if I miss a dose? If you miss a dose, take it as soon as you can. If it is almost time for your next dose, take only that dose. Do not take double or extra doses. What may interact with this medication? Do not take this medicine with any of the following medicines: aliskiren if you have  diabetes angiotensin-converting enzyme (ACE) inhibitors, like benazepril, captopril, enalapril, fosinopril, lisinopril, or ramipril tranylcypromine This medicine may also interact with the following medicines: angiotensin II receptor blockers (ARBs) like azilsartan, candesartan, eprosartan, irbesartan, losartan, olmesartan, telmisartan, or valsartan celecoxib lithium NSAIDS, medicines for pain and inflammation, like ibuprofen or naproxen potassium-sparing diuretics like amiloride, spironolactone, and triamterene potassium supplements This list may not describe all possible interactions. Give your health care provider a list of all the medicines, herbs, non-prescription drugs, or dietary supplements you use. Also tell them if you smoke, drink alcohol, or use illegal drugs. Some items may interact with your medicine. What should I watch for while using this medication? Tell your doctor or health care provider if your symptoms do not start to get better or if they get worse. Do not become pregnant while taking this medicine. Women should inform their health care provider if they wish to become pregnant or think they might be pregnant. There is a potential for serious harm to an unborn child. Talk to your health care provider for more information. You may get drowsy or dizzy. Do not drive, use machinery, or do anything that needs mental alertness until you know how this medicine affects you. Do not stand or sit up quickly, especially if you are an older patient. This reduces the risk of dizzy or fainting spells. Alcohol may interfere with the effects of this medicine. Avoid alcoholic drinks. Avoid salt substitutes unless you are told otherwise by your health care provider. What side effects may I notice from receiving this medication? Side effects that you should report to your doctor or health care provider as soon as possible: allergic reactions (skin rash, itching or hives; swelling of the face,  lips, or tongue) high potassium levels (chest pain; fast, irregular heartbeat; muscle weakness) kidney injury (trouble passing urine or change in the amount of urine) low blood pressure (dizziness; feeling faint or lightheaded, falls; unusually weak or tired) Side effects that usually do not require medical attention (report to your doctor or health care provider if they continue or are bothersome): cough This list may not describe all possible side effects. Call your doctor for medical advice about side effects. You may report side effects to FDA at 1-800-FDA-1088. Where should I keep my medication? Keep out of the reach of children and pets. Store at room temperature between 20 and 25 degrees C (68 and 77 degrees F). Protect from moisture. Keep the container tightly closed. Get rid of any unused medicine after the expiration date. To get rid of medicines that are no longer needed or have expired: Take the medicine to a take-back program. Check with your pharmacy or law enforcement to find a location. If you cannot return the medicine, check the label or package insert to see if the medicine should be thrown out in the garbage or flushed down the toilet. If you are not sure, ask your health care provider. If it is safe to put it in the trash, empty the medicine out of the container. Mix the medicine with cat litter, dirt, coffee  grounds, or other unwanted substance. Seal the mixture in a bag or container. Put it in the trash. NOTE: This sheet is a summary. It may not cover all possible information. If you have questions about this medicine, talk to your doctor, pharmacist, or health care provider.  2022 Elsevier/Gold Standard (2020-02-16 11:23:32)

## 2021-09-30 DIAGNOSIS — L240 Irritant contact dermatitis due to detergents: Secondary | ICD-10-CM | POA: Diagnosis not present

## 2021-09-30 DIAGNOSIS — I10 Essential (primary) hypertension: Secondary | ICD-10-CM | POA: Diagnosis not present

## 2021-09-30 DIAGNOSIS — T447X5A Adverse effect of beta-adrenoreceptor antagonists, initial encounter: Secondary | ICD-10-CM | POA: Diagnosis not present

## 2021-09-30 DIAGNOSIS — J449 Chronic obstructive pulmonary disease, unspecified: Secondary | ICD-10-CM | POA: Diagnosis not present

## 2021-09-30 DIAGNOSIS — E785 Hyperlipidemia, unspecified: Secondary | ICD-10-CM | POA: Diagnosis not present

## 2021-09-30 DIAGNOSIS — I5022 Chronic systolic (congestive) heart failure: Secondary | ICD-10-CM | POA: Diagnosis not present

## 2021-09-30 DIAGNOSIS — J301 Allergic rhinitis due to pollen: Secondary | ICD-10-CM | POA: Diagnosis not present

## 2021-09-30 DIAGNOSIS — E559 Vitamin D deficiency, unspecified: Secondary | ICD-10-CM | POA: Diagnosis not present

## 2021-09-30 LAB — BASIC METABOLIC PANEL
BUN/Creatinine Ratio: 6 — ABNORMAL LOW (ref 10–24)
BUN: 8 mg/dL (ref 8–27)
CO2: 27 mmol/L (ref 20–29)
Calcium: 9.6 mg/dL (ref 8.6–10.2)
Chloride: 102 mmol/L (ref 96–106)
Creatinine, Ser: 1.34 mg/dL — ABNORMAL HIGH (ref 0.76–1.27)
Glucose: 91 mg/dL (ref 70–99)
Potassium: 4.4 mmol/L (ref 3.5–5.2)
Sodium: 144 mmol/L (ref 134–144)
eGFR: 58 mL/min/{1.73_m2} — ABNORMAL LOW (ref >59)

## 2021-10-03 ENCOUNTER — Telehealth: Payer: Self-pay | Admitting: Medical

## 2021-10-03 NOTE — Telephone Encounter (Signed)
Patient made aware of lab results. He is already scheduled for a repeat bmp on 10/06/21.  Patient sts that is pcp d/c metoprolol. Patient reported change/loss in vision. Pt medlist/allergy list updated. Adv the patient to bring his med bottles with him to his upcoming appt .

## 2021-10-03 NOTE — Telephone Encounter (Signed)
Attempted to call the patient patient with results x2. Patient phone rings and a gentleman answered and then the line goes silent.

## 2021-10-03 NOTE — Telephone Encounter (Signed)
Patient returning call.  Please call home phone .

## 2021-10-05 DIAGNOSIS — Z79891 Long term (current) use of opiate analgesic: Secondary | ICD-10-CM | POA: Diagnosis not present

## 2021-10-06 ENCOUNTER — Other Ambulatory Visit (INDEPENDENT_AMBULATORY_CARE_PROVIDER_SITE_OTHER): Payer: Medicare Other

## 2021-10-06 ENCOUNTER — Other Ambulatory Visit: Payer: Self-pay

## 2021-10-06 DIAGNOSIS — Z79899 Other long term (current) drug therapy: Secondary | ICD-10-CM

## 2021-10-06 DIAGNOSIS — I2510 Atherosclerotic heart disease of native coronary artery without angina pectoris: Secondary | ICD-10-CM | POA: Diagnosis not present

## 2021-10-06 DIAGNOSIS — I5032 Chronic diastolic (congestive) heart failure: Secondary | ICD-10-CM | POA: Diagnosis not present

## 2021-10-06 DIAGNOSIS — I428 Other cardiomyopathies: Secondary | ICD-10-CM

## 2021-10-07 LAB — BASIC METABOLIC PANEL
BUN/Creatinine Ratio: 8 — ABNORMAL LOW (ref 10–24)
BUN: 9 mg/dL (ref 8–27)
CO2: 22 mmol/L (ref 20–29)
Calcium: 9.2 mg/dL (ref 8.6–10.2)
Chloride: 107 mmol/L — ABNORMAL HIGH (ref 96–106)
Creatinine, Ser: 1.14 mg/dL (ref 0.76–1.27)
Glucose: 117 mg/dL — ABNORMAL HIGH (ref 70–99)
Potassium: 4.3 mmol/L (ref 3.5–5.2)
Sodium: 146 mmol/L — ABNORMAL HIGH (ref 134–144)
eGFR: 71 mL/min/{1.73_m2} (ref >59)

## 2021-10-10 DIAGNOSIS — M150 Primary generalized (osteo)arthritis: Secondary | ICD-10-CM | POA: Diagnosis not present

## 2021-10-10 DIAGNOSIS — M546 Pain in thoracic spine: Secondary | ICD-10-CM | POA: Diagnosis not present

## 2021-10-10 DIAGNOSIS — G894 Chronic pain syndrome: Secondary | ICD-10-CM | POA: Diagnosis not present

## 2021-10-10 DIAGNOSIS — M545 Low back pain, unspecified: Secondary | ICD-10-CM | POA: Diagnosis not present

## 2021-10-10 DIAGNOSIS — M542 Cervicalgia: Secondary | ICD-10-CM | POA: Diagnosis not present

## 2021-10-10 DIAGNOSIS — G8929 Other chronic pain: Secondary | ICD-10-CM | POA: Diagnosis not present

## 2021-10-10 DIAGNOSIS — Z79891 Long term (current) use of opiate analgesic: Secondary | ICD-10-CM | POA: Diagnosis not present

## 2021-10-11 ENCOUNTER — Telehealth: Payer: Self-pay | Admitting: Cardiovascular Disease

## 2021-10-11 NOTE — Telephone Encounter (Signed)
Pt c/o medication issue:  1. Name of Medication: Entresto   2. How are you currently taking this medication (dosage and times per day)? 24-26 1 tablet 2 times daily   3. Are you having a reaction (difficulty breathing--STAT)? Dizzy   4. What is your medication issue? Patient calling, has been getting dizzy - especially when bending over or standing up and would like to know if he can cut the tablets in half and just do 1 tablet daily.  Please call to discuss

## 2021-10-11 NOTE — Telephone Encounter (Signed)
Spoke with pt.  Pt reports dizziness that began 2 days after starting Entresto.  Pt states he had difficulty walking d/t severity of dizziness.  Pt last seen in office with Cadence Furth, [place] 09/29/21 - Entresto 24-26 mg BID started.   Pt unsure what BP/HR was during dizziness episodes.  States he has held [employer] yesterday and this morning and dizziness has resolved.  BP/HR now 137/85 HR 98.  Pt states that he feels great and does not want to take "that medication" again because of how bad it made him feel.   Advised pt to hold Entresto d/t dizziness and will send to Cadence [place], [place] for further recc.  Pt voiced understanding and has no further questions at this time.

## 2021-10-13 DIAGNOSIS — Z23 Encounter for immunization: Secondary | ICD-10-CM | POA: Diagnosis not present

## 2021-10-14 NOTE — Telephone Encounter (Signed)
Spoke with patient and he states that his primary care provider already had told him to stop. He experienced some loss of sight and dizziness so he called his PCP which told him to stop. Reviewed that our provider recommendations are to stop medication and we can see how things are going at next visit. Confirmed upcoming appointment and to continue staying off medication for now. He was appreciative for the call with no further questions at this time.       Antony Madura, PA-C  Sent: Thu October 13, 2021 12:08 PM  To: P [facility] [place] Triage     Message  Noted dizziness on Entresto. OK to hold medication and reassess meds at follow-up.

## 2021-10-17 ENCOUNTER — Telehealth: Payer: Self-pay | Admitting: Medical

## 2021-10-17 ENCOUNTER — Encounter: Payer: Self-pay | Admitting: *Deleted

## 2021-10-17 DIAGNOSIS — G894 Chronic pain syndrome: Secondary | ICD-10-CM | POA: Diagnosis not present

## 2021-10-17 NOTE — Telephone Encounter (Signed)
Patient admitted 9/23-9/24/22 for NSTEMI. S/p cath on 9/23 with Dr. Fletcher Anon. 1.  Mild to moderate nonobstructive coronary artery disease. 2.  Moderately reduced LV systolic function with wall motion abnormality suggestive of stress-induced cardiomyopathy.  Mildly elevated left ventricular end-diastolic pressure.   Recommendations: Recommend medical therapy for nonobstructive coronary artery disease. Obtain an echocardiogram to better evaluate wall motion  He had an in office follow up with Cadence Kathlen Mody, PA on 09/29/21.  At this time I do not see that he would have any activity restrictions.  Forwarding to in office PA to confirm.

## 2021-10-17 NOTE — Telephone Encounter (Signed)
Per Marrianne Mood, PA- no current restrictions at this time.  I have spoken with the patient and advised him of PA recommendation. He voices understanding and is agreeable.   He did discuss that he had side effects from entresto- dizziness/ photosensitivity/ eyes being dilated. He states he was advised to stop entresto and his vision has improved and dizziness resolved.   He then advised that he ran out furosemide due to no refills on the prescription.  He told the pharmacy not to reach out for refills as he is not having any SOB/ edema. He does have 2 tablets of potassium left w/ no refills on the bottle. He states he is going to stop this after he finishes the current RX.  I have advised him I will notify Cadence Furth, [place] whom he saw in the office and advise her of the above. He is aware of his follow up appt with Cadence scheduled for 10/31/21 and will keep that appt.

## 2021-10-17 NOTE — Telephone Encounter (Signed)
Patient wants to know given recent admission for MI what activity restrictions would be if any.   Please call.

## 2021-10-28 ENCOUNTER — Telehealth: Payer: Self-pay | Admitting: Medical

## 2021-10-28 NOTE — Telephone Encounter (Signed)
Pt c/o medication issue:  1. Name of Medication: farxiga  2. How are you currently taking this medication (dosage and times per day)?  10 mg po q d   3. Are you having a reaction (difficulty breathing--STAT)? no  4. What is your medication issue? Patient wants to note he has questions about this med to discuss at [facility] .  Patient reviewed drug information and wants to know why he is taking a Diabetic medication.  He isnt aware of this dx.   Patient now aware medications can be used for other dx  And he is ok with discussing on Monday but thinks he will forget.

## 2021-10-28 NOTE — Telephone Encounter (Signed)
Note added to appt to discus Farxiga for cardiac benefit Appt with Cadence Kathlen Mody, PA-C 11/7

## 2021-10-31 ENCOUNTER — Other Ambulatory Visit: Payer: Self-pay

## 2021-10-31 ENCOUNTER — Ambulatory Visit (INDEPENDENT_AMBULATORY_CARE_PROVIDER_SITE_OTHER): Payer: Medicare Other | Admitting: Medical

## 2021-10-31 ENCOUNTER — Encounter: Payer: Self-pay | Admitting: Medical

## 2021-10-31 VITALS — BP 130/80 | HR 84 | Ht 70.0 in | Wt 154.8 lb

## 2021-10-31 DIAGNOSIS — I428 Other cardiomyopathies: Secondary | ICD-10-CM | POA: Diagnosis not present

## 2021-10-31 DIAGNOSIS — E782 Mixed hyperlipidemia: Secondary | ICD-10-CM | POA: Diagnosis not present

## 2021-10-31 DIAGNOSIS — I5032 Chronic diastolic (congestive) heart failure: Secondary | ICD-10-CM

## 2021-10-31 DIAGNOSIS — J449 Chronic obstructive pulmonary disease, unspecified: Secondary | ICD-10-CM | POA: Diagnosis not present

## 2021-10-31 DIAGNOSIS — Z720 Tobacco use: Secondary | ICD-10-CM | POA: Diagnosis not present

## 2021-10-31 DIAGNOSIS — I10 Essential (primary) hypertension: Secondary | ICD-10-CM | POA: Diagnosis not present

## 2021-10-31 DIAGNOSIS — B20 Human immunodeficiency virus [HIV] disease: Secondary | ICD-10-CM

## 2021-10-31 DIAGNOSIS — I2510 Atherosclerotic heart disease of native coronary artery without angina pectoris: Secondary | ICD-10-CM | POA: Diagnosis not present

## 2021-10-31 MED ORDER — LOSARTAN POTASSIUM 25 MG PO TABS: 25.0000 mg | ORAL_TABLET | Freq: Every day | ORAL | 3 refills | Status: DC

## 2021-10-31 NOTE — Progress Notes (Signed)
Cardiology Office Note:    Date:  10/31/2021   ID:  Albert Wong, DOB 1956/08/14, MRN [id number]  PCP:  Jodi Marble, MD  [facility] Cardiologist:  None  [facility] Electrophysiologist:  None   Referring MD: Jodi Marble, MD   Chief Complaint: 1 month follow-up  History of Present Illness:    Albert Wong is a 65 y.o. male with a hx of  with a hx of HIV disease diagnosed in early 24s, treated hepatitis, COPD, tobacco use who is being seen for follow-up.    Patient came into the [facility] ER 09/16/21 for shortness of breath. Patient's HIV medications were recently switched to [place].Hst troponin was elevated. LHC showed mild to moderate nonobstructive CAD with moderately reduced LVSF, mildly elevated LVEDP. Started on Aspirin, Lopressor, crestor, jardiance, lasix 20mg  daily. Echo showed LVEF 40-45%, g1dd.   Last seen 09/29/21 and was overall doing well. He was referred to cardiac rehab. Started on [place].   Today, the patient reports he has been doing really well since the last visit. He has been doing a lot of work outside. He was told to stop Potassium a couple weeks ago since his levels were normal. He is taking lasix 20mg  daily. Patient didn't tolerate Entresto, felt dizzy. Also said he did not tolerate metoprolol, and said it made him blind, so PCP suggest he stop this. No chest pain or significant SOB. NO LLE, orthopnea, pnd.   Past Medical History:  Diagnosis Date   Anxiety    Chronic nausea    Degenerative disk disease    HIV (human immunodeficiency virus infection) ([place])    HIV (human immunodeficiency virus infection) ([place])    Insomnia    Muscle spasm    Rotator cuff tear     Past Surgical History:  Procedure Laterality Date   dental procedure N/A    LEFT HEART CATH AND CORONARY ANGIOGRAPHY N/A 09/16/2021   Procedure: LEFT HEART CATH AND CORONARY ANGIOGRAPHY;  Surgeon: Wellington Hampshire, MD;  Location: [place] CV [facility];  Service: Cardiovascular;   Laterality: N/A;    Current Medications: Current Meds  Medication Sig   aspirin EC 81 MG EC tablet Take 1 tablet (81 mg total) by mouth daily. Swallow whole.   B-D 3CC LUER-LOK SYR 21GX1" 21G X 1" 3 ML MISC USE AS DIRECTED WITH TESTOSTERONE   baclofen (LIORESAL) 10 MG tablet TAKE 1 TABLET BY MOUTH EVERY DAY   BREZTRI AEROSPHERE 160-9-4.8 MCG/ACT AERO Inhale 2 puffs into the lungs 2 (two) times daily.   cromolyn (OPTICROM) 4 % ophthalmic solution SMARTSIG:1-2 Drop(s) In Eye(s) 4-6 Times Daily   dapagliflozin propanediol (FARXIGA) 10 MG TABS tablet Take 1 tablet (10 mg total) by mouth every evening.   diazepam (VALIUM) 10 MG tablet Take 10 mg by mouth every 8 (eight) hours as needed for anxiety.   dicyclomine (BENTYL) 20 MG tablet Take 20 mg by mouth 4 (four) times daily.   furosemide (LASIX) 20 MG tablet Take 1 tablet (20 mg total) by mouth daily.   Ipratropium-Albuterol (COMBIVENT) 20-100 MCG/ACT AERS respimat Inhale into the lungs.   losartan (COZAAR) 25 MG tablet Take 1 tablet (25 mg total) by mouth daily.   lubiprostone (AMITIZA) 24 MCG capsule Take 24 mcg by mouth 2 (two) times daily.   Melatonin 5 MG CAPS Take 1 capsule by mouth at bedtime.   morphine (MS CONTIN) 30 MG 12 hr tablet Take 1 tablet (30 mg total) by mouth 2 (two)  times daily.   rosuvastatin (CRESTOR) 20 MG tablet SMARTSIG:1 Tablet(s) By Mouth Every Evening   SYMBICORT 80-4.5 MCG/ACT inhaler Inhale 2 puffs into the lungs 2 (two) times daily.   triamcinolone cream (KENALOG) 0.1 % Apply topically 2 (two) times daily.   TRIUMEQ 600-50-300 MG tablet Take 1 tablet by mouth daily.     Allergies:   Dovato [dolutegravir-lamivudine], Metoprolol succinate [metoprolol], and Entresto [sacubitril-valsartan]   Social History   Socioeconomic History   Marital status: Single    Spouse name: Not on file   Number of children: Not on file   Years of education: Not on file   Highest education level: Not on file  Occupational History    Not on file  Tobacco Use   Smoking status: Some Days    Packs/day: 1.00    Years: 39.00    Pack years: 39.00    Types: Cigarettes   Smokeless tobacco: Never   Tobacco comments:    Wears patches. Smokes about 6 cigarettes a week.  Substance and Sexual Activity   Alcohol use: Yes   Drug use: No   Sexual activity: Not on file  Other Topics Concern   Not on file  Social History Narrative   Not on file   Social Determinants of Health   Financial Resource Strain: Not on file  Food Insecurity: Not on file  Transportation Needs: Not on file  Physical Activity: Not on file  Stress: Not on file  Social Connections: Not on file     Family History: The patient's family history includes Heart disease in his father.  ROS:   Please see the history of present illness.     All other systems reviewed and are negative.  EKGs/Labs/Other Studies Reviewed:    The following studies were reviewed today: Echo 09/17/21  1. Left ventricular ejection fraction, by estimation, is 40 to 45%. The  left ventricle has mildly decreased function. The left ventricle  demonstrates global hypokinesis. Left ventricular diastolic parameters are  consistent with Grade I diastolic  dysfunction (impaired relaxation).   2. Right ventricular systolic function is mildly reduced. The right  ventricular size is mildly enlarged. Tricuspid regurgitation signal is  inadequate for assessing PA pressure.   3. The inferior vena cava is dilated in size with >50% respiratory  variability, suggesting right atrial pressure of 8 mmHg.    Cardiac cath 09/16/21   Mid Cx lesion is 40% stenosed.   Mid RCA lesion is 30% stenosed.   Dist RCA lesion is 40% stenosed.   There is mild left ventricular systolic dysfunction.   LV end diastolic pressure is moderately elevated.   The left ventricular ejection fraction is 35-45% by visual estimate.   1.  Mild to moderate nonobstructive coronary artery disease. 2.  Moderately  reduced LV systolic function with wall motion abnormality suggestive of stress-induced cardiomyopathy.  Mildly elevated left ventricular end-diastolic pressure.   Recommendations: Recommend medical therapy for nonobstructive coronary artery disease. Obtain an echocardiogram to better evaluate wall motion.   EKG:  EKG is not ordered today.    Recent Labs: 07/21/2021: TSH 2.517 09/16/2021: ALT 8; B Natriuretic Peptide 48.7 09/17/2021: Hemoglobin 13.7; Platelets 205 10/06/2021: BUN 9; Creatinine, Ser 1.14; Potassium 4.3; Sodium 146  Recent Lipid Panel    Component Value Date/Time   CHOL 138 09/17/2021 0607   TRIG 61 09/17/2021 0607   HDL 36 (L) 09/17/2021 0607   CHOLHDL 3.8 09/17/2021 0607   VLDL 12 09/17/2021 0607   LDLCALC  90 09/17/2021 0607   Physical Exam:    VS:  BP 130/80 (BP Location: Left Arm, Patient Position: Sitting, Cuff Size: Normal)   Pulse 84   Ht 5\' 10"  (1.778 m)   Wt 154 lb 12.8 oz (70.2 kg)   SpO2 90%   BMI 22.21 kg/m     Wt Readings from Last 3 Encounters:  10/31/21 154 lb 12.8 oz (70.2 kg)  09/29/21 160 lb (72.6 kg)  09/16/21 166 lb (75.3 kg)     GEN:  Well nourished, well developed in no acute distress HEENT: Normal NECK: No JVD; No carotid bruits LYMPHATICS: No lymphadenopathy CARDIAC: RRR, no murmurs, rubs, gallops RESPIRATORY:  Clear to auscultation without rales, wheezing or rhonchi  ABDOMEN: Soft, non-tender, non-distended MUSCULOSKELETAL:  No edema; No deformity  SKIN: Warm and dry NEUROLOGIC:  Alert and oriented x 3 PSYCHIATRIC:  Normal affect   ASSESSMENT:    1. Coronary artery disease involving native coronary artery of native heart without angina pectoris   2. Chronic diastolic heart failure ([place])   3. Nonischemic cardiomyopathy ([place])   4. Tobacco use   5. HIV infection, unspecified symptom status ([place])   6. Chronic obstructive pulmonary disease, unspecified COPD type ([place])   7. Essential hypertension   8. Hyperlipidemia, mixed     PLAN:    In order of problems listed above:  NSTEMI Nonobstructive CAD Cath showed nonobstructive CAD. No anginal symptoms. Continue Aspirin, statin. He stopped metoprolol, said it made him "go blind" No further work-up at this time. Consider addition of Coreg at follow-up.   HFrEF NICM Patient did not tolerate Entresto or metoprolol. Will start Losartan 25mg  daily. Continue Farxiga. Continue GDMT with BB vs spironolactone at follow-up. Euvolemic on exam with current lasix dose. He stopped potassium supplement. BMET today and BMET in a week.   HTN BP good today. Add Losartan 25mg  daily as above.  HLD LDL 90 08/2021. Continue statin. Re-check lipid panel/LFTs in 6-8 weeks  Smoker/COPD Complete cessation advised  HIV Followed by ID  Disposition: Follow up in 3 week(s) with MD/APP     Signed, Taren Toops Ninfa Meeker, PA-C  10/31/2021 4:01 PM    [facility]

## 2021-10-31 NOTE — Patient Instructions (Signed)
Medication Instructions:  Your physician has recommended you make the following change in your medication:   START Losartan 25 mg once a day  *If you need a refill on your cardiac medications before your next appointment, please call your pharmacy*   Lab Work: Bmet today Bmet in one week here in our office  If you have labs (blood work) drawn today and your tests are completely normal, you will receive your results only by: [employer] (if you have MyChart) OR A paper copy in the mail If you have any lab test that is abnormal or we need to change your treatment, we will call you to review the results.   Testing/Procedures: None   Follow-Up: At [facility], you and your health needs are our priority.  As part of our continuing mission to provide you with exceptional heart care, we have created designated Provider Care Teams.  These Care Teams include your primary Cardiologist (physician) and Advanced Practice Providers (APPs -  Physician Assistants and Nurse Practitioners) who all work together to provide you with the care you need, when you need it.  We recommend signing up for the patient portal called "MyChart".  Sign up information is provided on this After Visit Summary.  MyChart is used to connect with patients for Virtual Visits (Telemedicine).  Patients are able to view lab/test results, encounter notes, upcoming appointments, etc.  Non-urgent messages can be sent to your provider as well.   To learn more about what you can do with MyChart, go to [email or web address].    Your next appointment:   3 week(s)  The format for your next appointment:   In Person  Provider:   Cadence Kathlen Mody, PA-C

## 2021-11-01 LAB — BASIC METABOLIC PANEL
BUN/Creatinine Ratio: 9 — ABNORMAL LOW (ref 10–24)
BUN: 11 mg/dL (ref 8–27)
CO2: 22 mmol/L (ref 20–29)
Calcium: 9.5 mg/dL (ref 8.6–10.2)
Chloride: 104 mmol/L (ref 96–106)
Creatinine, Ser: 1.18 mg/dL (ref 0.76–1.27)
Glucose: 109 mg/dL — ABNORMAL HIGH (ref 70–99)
Potassium: 4.6 mmol/L (ref 3.5–5.2)
Sodium: 144 mmol/L (ref 134–144)
eGFR: 68 mL/min/{1.73_m2} (ref >59)

## 2021-11-07 ENCOUNTER — Other Ambulatory Visit: Payer: Medicare Other

## 2021-11-07 DIAGNOSIS — G894 Chronic pain syndrome: Secondary | ICD-10-CM | POA: Diagnosis not present

## 2021-11-07 DIAGNOSIS — M542 Cervicalgia: Secondary | ICD-10-CM | POA: Diagnosis not present

## 2021-11-07 DIAGNOSIS — M150 Primary generalized (osteo)arthritis: Secondary | ICD-10-CM | POA: Diagnosis not present

## 2021-11-07 DIAGNOSIS — M545 Low back pain, unspecified: Secondary | ICD-10-CM | POA: Diagnosis not present

## 2021-11-07 DIAGNOSIS — F17210 Nicotine dependence, cigarettes, uncomplicated: Secondary | ICD-10-CM | POA: Diagnosis not present

## 2021-11-07 DIAGNOSIS — Z79891 Long term (current) use of opiate analgesic: Secondary | ICD-10-CM | POA: Diagnosis not present

## 2021-11-07 DIAGNOSIS — M546 Pain in thoracic spine: Secondary | ICD-10-CM | POA: Diagnosis not present

## 2021-11-07 DIAGNOSIS — G8929 Other chronic pain: Secondary | ICD-10-CM | POA: Diagnosis not present

## 2021-11-08 ENCOUNTER — Other Ambulatory Visit (INDEPENDENT_AMBULATORY_CARE_PROVIDER_SITE_OTHER): Payer: Medicare Other

## 2021-11-08 ENCOUNTER — Other Ambulatory Visit: Payer: Self-pay

## 2021-11-08 DIAGNOSIS — I428 Other cardiomyopathies: Secondary | ICD-10-CM | POA: Diagnosis not present

## 2021-11-08 DIAGNOSIS — I2510 Atherosclerotic heart disease of native coronary artery without angina pectoris: Secondary | ICD-10-CM | POA: Diagnosis not present

## 2021-11-08 DIAGNOSIS — I5032 Chronic diastolic (congestive) heart failure: Secondary | ICD-10-CM | POA: Diagnosis not present

## 2021-11-09 LAB — BASIC METABOLIC PANEL
BUN/Creatinine Ratio: 9 — ABNORMAL LOW (ref 10–24)
BUN: 10 mg/dL (ref 8–27)
CO2: 23 mmol/L (ref 20–29)
Calcium: 9.6 mg/dL (ref 8.6–10.2)
Chloride: 104 mmol/L (ref 96–106)
Creatinine, Ser: 1.17 mg/dL (ref 0.76–1.27)
Glucose: 82 mg/dL (ref 70–99)
Potassium: 4.5 mmol/L (ref 3.5–5.2)
Sodium: 145 mmol/L — ABNORMAL HIGH (ref 134–144)
eGFR: 69 mL/min/{1.73_m2} (ref >59)

## 2021-11-10 ENCOUNTER — Telehealth: Payer: Self-pay | Admitting: Medical

## 2021-11-10 NOTE — Telephone Encounter (Signed)
I spoke with the patient. He states he was calling to follow up on his lab results as he had not been contacted about the results.  I reviewed the patient's chart. He had a BMP done on 10/31/21 - result notes state he was notified of these results on 11/01/21 by nursing. He also had BMP done on 11/08/21- results notes state he was notified of these results on 11/09/21 by nursing.   I have notified him of these results again and he voices understanding.  The patient then wanted to just talk about all the medications he was taking and he really just wanted to be able to stop some of these. He voiced appreciation for my listening and stated "I just really needed to talk."  The patient is aware he will see Cadence Furth, [place] on 11/25/21 and is aware I am uncertain any of his cardiac medications will be changed at that time, but if he is stable, hopefully we can push his follow up appointments out a little further.  The patient voices understanding and is agreeable.

## 2021-11-10 NOTE — Telephone Encounter (Signed)
Patient calling to discuss questions about recent  testing results   Please call

## 2021-11-10 NOTE — Telephone Encounter (Signed)
Kavin Leech, RN  11/09/2021  4:09 PM EST Back to Top    The patient has been notified of the result and verbalized understanding.  All questions (if any) were answered. Kavin Leech, RN 11/09/2021 4:05 PM    Cadence Ninfa Meeker, PA-C  11/09/2021 12:49 PM EST     Labs stable

## 2021-11-22 ENCOUNTER — Ambulatory Visit: Payer: Medicare Other | Attending: Infectious Diseases | Admitting: Infectious Diseases

## 2021-11-22 ENCOUNTER — Other Ambulatory Visit: Payer: Self-pay

## 2021-11-22 ENCOUNTER — Other Ambulatory Visit
Admission: RE | Admit: 2021-11-22 | Discharge: 2021-11-22 | Disposition: A | Discharge status: Home / Self Care | Payer: Medicare Other | Attending: Infectious Diseases | Admitting: Infectious Diseases

## 2021-11-22 ENCOUNTER — Encounter: Payer: Self-pay | Admitting: Infectious Diseases

## 2021-11-22 VITALS — BP 128/67 | HR 97 | Resp 16 | Ht 70.0 in | Wt 158.0 lb

## 2021-11-22 DIAGNOSIS — J449 Chronic obstructive pulmonary disease, unspecified: Secondary | ICD-10-CM | POA: Diagnosis not present

## 2021-11-22 DIAGNOSIS — Z87891 Personal history of nicotine dependence: Secondary | ICD-10-CM | POA: Diagnosis not present

## 2021-11-22 DIAGNOSIS — B20 Human immunodeficiency virus [HIV] disease: Secondary | ICD-10-CM | POA: Insufficient documentation

## 2021-11-22 DIAGNOSIS — Z7951 Long term (current) use of inhaled steroids: Secondary | ICD-10-CM | POA: Diagnosis not present

## 2021-11-22 DIAGNOSIS — I252 Old myocardial infarction: Secondary | ICD-10-CM | POA: Insufficient documentation

## 2021-11-22 DIAGNOSIS — E785 Hyperlipidemia, unspecified: Secondary | ICD-10-CM | POA: Insufficient documentation

## 2021-11-22 DIAGNOSIS — Z79899 Other long term (current) drug therapy: Secondary | ICD-10-CM | POA: Diagnosis not present

## 2021-11-22 DIAGNOSIS — Z8619 Personal history of other infectious and parasitic diseases: Secondary | ICD-10-CM | POA: Diagnosis not present

## 2021-11-22 DIAGNOSIS — F4310 Post-traumatic stress disorder, unspecified: Secondary | ICD-10-CM | POA: Diagnosis not present

## 2021-11-22 DIAGNOSIS — I10 Essential (primary) hypertension: Secondary | ICD-10-CM | POA: Diagnosis not present

## 2021-11-22 DIAGNOSIS — M549 Dorsalgia, unspecified: Secondary | ICD-10-CM | POA: Diagnosis not present

## 2021-11-22 MED ORDER — TRIUMEQ 600-50-300 MG PO TABS: 1.0000 | ORAL_TABLET | Freq: Every day | ORAL | 6 refills | Status: DC

## 2021-11-22 NOTE — Patient Instructions (Addendum)
You are here for follow up for HIV- you are back on triumeq( abacavir, epivir, dolutegravir) .as you thought Dovato ( epivir + dolutegravir)made you short of breath. You were diagnosed with NSTEMI. Today you are doing better and you have quit smoking as it is making you very Short of breath. Today we will check labs- HIV RNA, Cd4 and RPR. Tomorrow when you see your PCP please show this to him. Last Vl from 06/21/21 was < 20 and Cd4 is 527 ( 35%) Will follow in 6 months

## 2021-11-22 NOTE — Progress Notes (Signed)
NAME: Albert Wong  DOB: 21-Jan-1956  MRN: [id number]  Date/Time: 11/22/2021 10:14 AM   Subjective:  Pt is here for follow up  Since the last time I saw him on 07/21/21 he was hospitalized between 09/16/21-09/17/21 with sob on extertion for 3 days which he attributed to switching HIV meds- He was on Triumeq which is abacavir, epivir and dolutegravir to Dovato which is epivir + dolutegravir without abacavir In the ED he had a n increase in troponin to 531 , BNP 48, BP 115/71, CXR neg Had CT angio Mild to moderate nonobstructive coronary artery disease. 2.  Moderately reduced LV systolic function with wall motion abnormality suggestive of stress-induced cardiomyopathy.  Mildly elevated left ventricular end-diastolic pressure. Medical management for non obstructive CAD was recommended. EF was 40-45% Amlodipine was switched to metoprolol [name] was added . He is doing better until he smoked a cigarette and became very sob  Says he almost went blind with a medicine which his PCP asked him to stop- He does not know what the pill was and what he was taking it .  Pt says he has good appetite, eating a lot No chest pain Has some baseline cough Uses inhalers No diarrhea No pain abdomen  Last Vl < 20 Cd4  526 100% adherent to triumeq  Following taken from his first visit note ? Albert Wong is a 65 y.o. with a history of HIV disease diagnosed in early 65s , Treated hepatitis C , COPD, current smoker, on MS contin for back pain ( pain management by Dr.Montique  Humphrey Rolls), HTN, hyperlipidemia  HIV diagnosed  early 71s., says it was a routine blood test, - was in care at [facility] for many years and then went to see Dr.Fitzgerald in 2014-2019, then [facility]  Nadir Cd4 DK- on  08/14/1991 was 440 VL  OI -None  HAARt history First regimen  Azt based Atripla Now on Triumeq 100% adherent to HAART- says last Vl < 20  Acquired thru sex with men Not sexually active in 15 years he says Genotype unknown ? Past  Medical History:  Diagnosis Date   Anxiety    Chronic nausea    Degenerative disk disease    HIV (human immunodeficiency virus infection) ([place])    HIV (human immunodeficiency virus infection) ([place])    Insomnia    Muscle spasm    Rotator cuff tear     Past Surgical History:  Procedure Laterality Date   dental procedure N/A    LEFT HEART CATH AND CORONARY ANGIOGRAPHY N/A 09/16/2021   Procedure: LEFT HEART CATH AND CORONARY ANGIOGRAPHY;  Surgeon: Wellington Hampshire, MD;  Location: [place] [facility];  Service: Cardiovascular;  Laterality: N/A;    Social History   Socioeconomic History   Marital status: Single    Spouse name: Not on file   Number of children: Not on file   Years of education: Not on file   Highest education level: Not on file  Occupational History   Not on file  Tobacco Use   Smoking status: Some Days    Packs/day: 1.00    Years: 39.00    Pack years: 39.00    Types: Cigarettes   Smokeless tobacco: Never   Tobacco comments:    Wears patches. Smokes about 6 cigarettes a week.  Substance and Sexual Activity   Alcohol use: Yes   Drug use: No   Sexual activity: Not on file  Other Topics Concern   Not on file  Social History Narrative   Not on file   Social Determinants of Health   Financial Resource Strain: Not on file  Food Insecurity: Not on file  Transportation Needs: Not on file  Physical Activity: Not on file  Stress: Not on file  Social Connections: Not on file  Intimate Partner Violence: Not on file    Family History  Problem Relation Age of Onset   Heart disease Father   Mother breast cancer Allergies  Allergen Reactions   Dovato [Dolutegravir-Lamivudine] Other (See Comments)    Severe Flu like symptoms Sweating  Shortness of breath Body aches   Metoprolol Succinate [Metoprolol] Photosensitivity    Patients reported change in vision. D/c by pt pcp   Entresto [Sacubitril-Valsartan] Photosensitivity    Dizziness   ? Current  Outpatient Medications  Medication Sig Dispense Refill   aspirin EC 81 MG EC tablet Take 1 tablet (81 mg total) by mouth daily. Swallow whole. 30 tablet 2   B-D 3CC LUER-LOK SYR 21GX1" 21G X 1" 3 ML MISC USE AS DIRECTED WITH TESTOSTERONE 24 each 6   baclofen (LIORESAL) 10 MG tablet TAKE 1 TABLET BY MOUTH EVERY DAY 30 tablet 11   BREZTRI AEROSPHERE 160-9-4.8 MCG/ACT AERO Inhale 2 puffs into the lungs 2 (two) times daily.     cromolyn (OPTICROM) 4 % ophthalmic solution SMARTSIG:1-2 Drop(s) In Eye(s) 4-6 Times Daily     dapagliflozin propanediol (FARXIGA) 10 MG TABS tablet Take 1 tablet (10 mg total) by mouth every evening. 30 tablet 2   diazepam (VALIUM) 10 MG tablet Take 10 mg by mouth every 8 (eight) hours as needed for anxiety.     dicyclomine (BENTYL) 20 MG tablet Take 20 mg by mouth 4 (four) times daily.     furosemide (LASIX) 20 MG tablet Take 1 tablet (20 mg total) by mouth daily. 30 tablet 0   Ipratropium-Albuterol (COMBIVENT) 20-100 MCG/ACT AERS respimat Inhale into the lungs.     losartan (COZAAR) 25 MG tablet Take 1 tablet (25 mg total) by mouth daily. 90 tablet 3   lubiprostone (AMITIZA) 24 MCG capsule Take 24 mcg by mouth 2 (two) times daily.     Melatonin 5 MG CAPS Take 1 capsule by mouth at bedtime.     morphine (MS CONTIN) 30 MG 12 hr tablet Take 1 tablet (30 mg total) by mouth 2 (two) times daily. 60 tablet 0   potassium chloride SA (KLOR-CON) 20 MEQ tablet Take 1 tablet (20 mEq total) by mouth daily. 30 tablet 0   pregabalin (LYRICA) 50 MG capsule Take 50 mg by mouth at bedtime.     rosuvastatin (CRESTOR) 20 MG tablet SMARTSIG:1 Tablet(s) By Mouth Every Evening     SYMBICORT 80-4.5 MCG/ACT inhaler Inhale 2 puffs into the lungs 2 (two) times daily.     tamsulosin (FLOMAX) 0.4 MG CAPS capsule TAKE 1 CAPSULE BY MOUTH ONCE DAILY 30 capsule 11   triamcinolone cream (KENALOG) 0.1 % Apply topically 2 (two) times daily.     TRIUMEQ 600-50-300 MG tablet Take 1 tablet by mouth daily.      Dolutegravir-lamiVUDine (DOVATO) 50-300 MG TABS Take 1 tablet by mouth daily. (Patient not taking: Reported on 10/31/2021) 30 tablet 3   No current facility-administered medications for this visit.    REVIEW OF SYSTEMS:  Const: negative fever, negative chills, fluctuating weight- when he was taking care of his mother he weighed 129 pounds- After she passed away he was 180. He also was on marinol for PTSD.  Now he is between 166-170. He lost a few pounds intentionally Eyes: had nearly lost his vision he says due to a medicine and his PCP stopped it. He does not know what medicine it was. ENT: negative coryza, negative sore throat Resp: has copd but says it does not bother him much-  Cards: negative for chest pain, palpitations, lower extremity edema GU: negative for frequency, dysuria and hematuria Skin: negative for rash and pruritus Heme: negative for easy bruising and gum/nose bleeding MS: back pain Neurolo:negative for headaches, dizziness, vertigo, memory problems  Psych: PTSD  Objective:  VITALS:  BP 128/67   Pulse 97   Resp 16   Ht 5\' 10"  (1.778 m)   Wt 158 lb (71.7 kg)   SpO2 91%   BMI 22.67 kg/m  PHYSICAL EXAM:  General: Alert, cooperative, no distress, appears stated age.  Head: Normocephalic, without obvious abnormality, atraumatic. Eyes: Conjunctivae clear, anicteric sclerae. Pupils are equal Nose: Nares normal. No drainage or sinus tenderness. Throat: Lips, mucosa, and tongue normal. No Thrush Neck: Supple, symmetrical, no adenopathy, thyroid: non tender no carotid bruit and no JVD. Back: No CVA tenderness. Lungs: Clear to auscultation bilaterally. No Wheezing or Rhonchi. No rales. Heart: Regular rate and rhythm, no murmur, rub or gallop. Abdomen: Soft, non-tender,not distended. Bowel sounds normal. No masses Extremities: Extremities normal, atraumatic, no cyanosis. No edema. No clubbing Skin: dry skin Lymph: Cervical, supraclavicular normal. Neurologic: Grossly  non-focal Pertinent Labs {NA  IMAGING RESULTS: Health maintenance Vaccination  Vaccine Date last given comment  Influenza    Hepatitis B Has antibodies   Hepatitis A    Prevnar-PCV-13 ?   Pneumovac-PPSV-23 2020   TdaP 2021   HPV    Shingrix ( zoster vaccine)     ______________________  Labs Lab Result  Date comment  HIV VL <20 06/21/21   CD4 527 06/21/21   Genotype     [id number] NR    HIV antibody     RPR NR 06/21/21   Quantiferon Gold Negative    Hep C ab reactive    Hepatitis B-ab,ag,c Sab > 12.7    Hepatitis A-IgM, IgG /T Total-pos    Lipid TC 138, LDL 90, TGL 61, HDL 36    GC/CHL          HB,PLT,Cr, LFT 13.7, Plt 205      Preventive  Procedure Result  Date comment  colonoscopy  2018        Dental exam     Opthal       Impression/Recommendation ? ?HIV disease- he is well controlled- Vl < 20 and cd4 is 527 on triumeq which is a fixed drug combination of abacavir, 3TC, dolutegravir. Genosure archive was no resistance and hence he was switched to [employer] which is Truimeq without Abacavir- He says he developed sob due to that and had to come tot the hospital- noted to have NSTEMI but angio non obstructive coronaries- Was medically managed- He is back on triumeq and doing well  Smoker- had quit and he smoked one recently  and it made him very SOB  Says he ha not been smoking after that Fatigue has resolved   HTN on amlodipine-   Treated Hepatits C- HCV RNA not detected  COPD- on inhalers, not on  home oxygen followed by Dr.Aleskerov  LBA- on MS contin ? ________________________________________________ Follow up 6 months or earlier

## 2021-11-23 DIAGNOSIS — E785 Hyperlipidemia, unspecified: Secondary | ICD-10-CM | POA: Diagnosis not present

## 2021-11-23 DIAGNOSIS — I10 Essential (primary) hypertension: Secondary | ICD-10-CM | POA: Diagnosis not present

## 2021-11-23 LAB — T-HELPER CELLS CD4/CD8 %
% CD 4 Pos. Lymph.: 34.8 % (ref 30.8–58.5)
Absolute CD 4 Helper: 696 /uL (ref 359–1519)
Basophils Absolute: 0.1 10*3/uL (ref 0.0–0.2)
Basos: 1 %
CD3+CD4+ Cells/CD3+CD8+ Cells Bld: 0.74 — ABNORMAL LOW (ref 0.92–3.72)
CD3+CD8+ Cells # Bld: 938 /uL — ABNORMAL HIGH (ref 109–897)
CD3+CD8+ Cells NFr Bld: 46.9 % — ABNORMAL HIGH (ref 12.0–35.5)
EOS (ABSOLUTE): 0.2 10*3/uL (ref 0.0–0.4)
Eos: 3 %
Hematocrit: 47.1 % (ref 37.5–51.0)
Hemoglobin: 16.3 g/dL (ref 13.0–17.7)
Immature Grans (Abs): 0 10*3/uL (ref 0.0–0.1)
Immature Granulocytes: 0 %
Lymphocytes Absolute: 2 10*3/uL (ref 0.7–3.1)
Lymphs: 29 %
MCH: 35.6 pg — ABNORMAL HIGH (ref 26.6–33.0)
MCHC: 34.6 g/dL (ref 31.5–35.7)
MCV: 103 fL — ABNORMAL HIGH (ref 79–97)
Monocytes Absolute: 0.7 10*3/uL (ref 0.1–0.9)
Monocytes: 11 %
Neutrophils Absolute: 3.9 10*3/uL (ref 1.4–7.0)
Neutrophils: 56 %
Platelets: 222 10*3/uL (ref 150–450)
RBC: 4.58 x10E6/uL (ref 4.14–5.80)
RDW: 12.2 % (ref 11.6–15.4)
WBC: 6.8 10*3/uL (ref 3.4–10.8)

## 2021-11-23 LAB — HIV-1 RNA QUANT-NO REFLEX-BLD
HIV 1 RNA Quant: 60 copies/mL
LOG10 HIV-1 RNA: 1.778 log10copy/mL

## 2021-11-23 LAB — RPR: RPR Ser Ql: NONREACTIVE

## 2021-11-25 ENCOUNTER — Ambulatory Visit: Payer: Medicare Other | Admitting: Medical

## 2021-11-25 DIAGNOSIS — E559 Vitamin D deficiency, unspecified: Secondary | ICD-10-CM | POA: Diagnosis not present

## 2021-11-25 DIAGNOSIS — G8929 Other chronic pain: Secondary | ICD-10-CM | POA: Diagnosis not present

## 2021-11-25 DIAGNOSIS — E785 Hyperlipidemia, unspecified: Secondary | ICD-10-CM | POA: Diagnosis not present

## 2021-11-25 DIAGNOSIS — R110 Nausea: Secondary | ICD-10-CM | POA: Diagnosis not present

## 2021-11-25 DIAGNOSIS — L240 Irritant contact dermatitis due to detergents: Secondary | ICD-10-CM | POA: Diagnosis not present

## 2021-11-25 DIAGNOSIS — J449 Chronic obstructive pulmonary disease, unspecified: Secondary | ICD-10-CM | POA: Diagnosis not present

## 2021-11-25 DIAGNOSIS — Z0001 Encounter for general adult medical examination with abnormal findings: Secondary | ICD-10-CM | POA: Diagnosis not present

## 2021-11-25 DIAGNOSIS — J301 Allergic rhinitis due to pollen: Secondary | ICD-10-CM | POA: Diagnosis not present

## 2021-11-25 DIAGNOSIS — I10 Essential (primary) hypertension: Secondary | ICD-10-CM | POA: Diagnosis not present

## 2021-11-28 ENCOUNTER — Telehealth: Payer: Self-pay | Admitting: Medical

## 2021-11-28 NOTE — Telephone Encounter (Signed)
Patient unable to come in person as he has run back in forth to several appts and cannot make it to this one.   Changed to virtual . Patient consent below .

## 2021-11-28 NOTE — Telephone Encounter (Signed)
  Patient Consent for Virtual Visit        Albert Wong has provided verbal consent on 11/28/2021 for a virtual visit (video or telephone).   CONSENT FOR VIRTUAL VISIT FOR:  Albert Wong  By participating in this virtual visit I agree to the following:  I hereby voluntarily request, consent and authorize [facility] and its employed or contracted physicians, Engineer, materials, nurse practitioners or other licensed health care professionals (the Practitioner), to provide me with telemedicine health care services (the "Services") as deemed necessary by the treating Practitioner. I acknowledge and consent to receive the Services by the Practitioner via telemedicine. I understand that the telemedicine visit will involve communicating with the Practitioner through live audiovisual communication technology and the disclosure of certain medical information by electronic transmission. I acknowledge that I have been given the opportunity to request an in-person assessment or other available alternative prior to the telemedicine visit and am voluntarily participating in the telemedicine visit.  I understand that I have the right to withhold or withdraw my consent to the use of telemedicine in the course of my care at any time, without affecting my right to future care or treatment, and that the Practitioner or I may terminate the telemedicine visit at any time. I understand that I have the right to inspect all information obtained and/or recorded in the course of the telemedicine visit and may receive copies of available information for a reasonable fee.  I understand that some of the potential risks of receiving the Services via telemedicine include:  Delay or interruption in medical evaluation due to technological equipment failure or disruption; Information transmitted may not be sufficient (e.g. poor resolution of images) to allow for appropriate medical decision making by the Practitioner; and/or   In rare instances, security protocols could fail, causing a breach of personal health information.  Furthermore, I acknowledge that it is my responsibility to provide information about my medical history, conditions and care that is complete and accurate to the best of my ability. I acknowledge that Practitioner's advice, recommendations, and/or decision may be based on factors not within their control, such as incomplete or inaccurate data provided by me or distortions of diagnostic images or specimens that may result from electronic transmissions. I understand that the practice of medicine is not an exact science and that Practitioner makes no warranties or guarantees regarding treatment outcomes. I acknowledge that a copy of this consent can be made available to me via my patient portal (Albert Wong), or I can request a printed copy by calling the office of [facility].    I understand that my insurance will be billed for this visit.   I have read or had this consent read to me. I understand the contents of this consent, which adequately explains the benefits and risks of the Services being provided via telemedicine.  I have been provided ample opportunity to ask questions regarding this consent and the Services and have had my questions answered to my satisfaction. I give my informed consent for the services to be provided through the use of telemedicine in my medical care

## 2021-11-28 NOTE — Telephone Encounter (Signed)
New Message:      Patient wants to know if he can have a Virtual Visit tomorrow(11-29-21) instead of an office visit?

## 2021-11-29 ENCOUNTER — Telehealth: Payer: Self-pay | Admitting: Medical

## 2021-11-29 ENCOUNTER — Telehealth (INDEPENDENT_AMBULATORY_CARE_PROVIDER_SITE_OTHER): Payer: Medicare Other | Admitting: Medical

## 2021-11-29 ENCOUNTER — Ambulatory Visit: Payer: Medicare Other | Admitting: Medical

## 2021-11-29 ENCOUNTER — Telehealth: Payer: Self-pay | Admitting: Cardiovascular Disease

## 2021-11-29 ENCOUNTER — Encounter: Payer: Self-pay | Admitting: Medical

## 2021-11-29 ENCOUNTER — Other Ambulatory Visit: Payer: Self-pay

## 2021-11-29 VITALS — BP 120/91 | HR 112 | Ht 70.5 in | Wt 158.0 lb

## 2021-11-29 DIAGNOSIS — I2510 Atherosclerotic heart disease of native coronary artery without angina pectoris: Secondary | ICD-10-CM | POA: Diagnosis not present

## 2021-11-29 DIAGNOSIS — I428 Other cardiomyopathies: Secondary | ICD-10-CM | POA: Diagnosis not present

## 2021-11-29 DIAGNOSIS — Z720 Tobacco use: Secondary | ICD-10-CM

## 2021-11-29 DIAGNOSIS — I10 Essential (primary) hypertension: Secondary | ICD-10-CM | POA: Diagnosis not present

## 2021-11-29 DIAGNOSIS — E782 Mixed hyperlipidemia: Secondary | ICD-10-CM | POA: Diagnosis not present

## 2021-11-29 DIAGNOSIS — I5022 Chronic systolic (congestive) heart failure: Secondary | ICD-10-CM | POA: Diagnosis not present

## 2021-11-29 DIAGNOSIS — J449 Chronic obstructive pulmonary disease, unspecified: Secondary | ICD-10-CM | POA: Diagnosis not present

## 2021-11-29 DIAGNOSIS — B20 Human immunodeficiency virus [HIV] disease: Secondary | ICD-10-CM

## 2021-11-29 NOTE — Telephone Encounter (Signed)
Patient wants to re clarify information from AVS - wants to know if he is starting any new medications  Please call to discuss

## 2021-11-29 NOTE — Telephone Encounter (Signed)
Spoke to pt. Notified no medication changes today.  He has been scheduled for echo and f/u and advised that he proceed with echo as scheduled.  Pt has no further questions.

## 2021-11-29 NOTE — Telephone Encounter (Signed)
Lmov to schedule echo and fu in 3 months

## 2021-11-29 NOTE — Progress Notes (Signed)
Virtual Visit via Telephone Note   This visit type was conducted due to national recommendations for restrictions regarding the COVID-19 Pandemic (e.g. social distancing) in an effort to limit this patient's exposure and mitigate transmission in our community.  Due to his co-morbid illnesses, this patient is at least at moderate risk for complications without adequate follow up.  This format is felt to be most appropriate for this patient at this time.  The patient did not have access to video technology/had technical difficulties with video requiring transitioning to audio format only (telephone).  All issues noted in this document were discussed and addressed.  No physical exam could be performed with this format.  Please refer to the patient's chart for his  consent to telehealth for [employer].    Date:  11/29/2021   ID:  Albert Wong, DOB 1956/03/11, MRN [id number] The patient was identified using 2 identifiers.  Patient Location: Home Provider Location: Office/Clinic   PCP:  Wellington Hampshire, MD   [facility] Providers Cardiologist: Dr. Fletcher Anon   Evaluation Performed:  Follow-Up Visit  Chief Complaint:  F/u CHF  History of Present Illness:    Albert Wong is a 65 y.o. male with a hx of  with a hx of HIV disease diagnosed in early 60s, treated hepatitis, COPD, NICM, HFmrEF, nonobstructive CAD by cath 08/2021, HTN tobacco use who is being seen for follow-up.    Patient came into the [facility] ER 09/16/21 for shortness of breath. Patient's HIV medications were recently switched to [place].Hst troponin was elevated. LHC showed mild to moderate nonobstructive CAD with moderately reduced LVSF, mildly elevated LVEDP. Started on Aspirin, Lopressor, crestor, jardiance, lasix 20mg  daily. Echo showed LVEF 40-45%, g1dd.    Last seen 09/29/21 and was overall doing well. He was referred to cardiac rehab. Started on [place].   Last seen 10/31/21 and was overall doing well. He did not tolerate  Entresto or metoprolol. Losartan was started. [name] was continued.   Today, the patient reports he has been tolerating Losartan and [place]. He is eating and drinking better. No chest pain, SOB, LLE, orthopnea, palpitations, pnd. Ambulating good without SOB. He is constantly moving all day long. Discussed addition of spironolactone, not wanting to give more blood or start another medication. He is still smoking.   The patient does not have symptoms concerning for COVID-19 infection (fever, chills, cough, or new shortness of breath).    Past Medical History:  Diagnosis Date   Anxiety    Chronic nausea    Degenerative disk disease    HIV (human immunodeficiency virus infection) ([place])    HIV (human immunodeficiency virus infection) ([place])    Insomnia    Muscle spasm    Rotator cuff tear    Past Surgical History:  Procedure Laterality Date   dental procedure N/A    LEFT HEART CATH AND CORONARY ANGIOGRAPHY N/A 09/16/2021   Procedure: LEFT HEART CATH AND CORONARY ANGIOGRAPHY;  Surgeon: Wellington Hampshire, MD;  Location: [facility];  Service: Cardiovascular;  Laterality: N/A;     Current Meds  Medication Sig   aspirin EC 81 MG EC tablet Take 1 tablet (81 mg total) by mouth daily. Swallow whole.   B-D 3CC LUER-LOK SYR 21GX1" 21G X 1" 3 ML MISC USE AS DIRECTED WITH TESTOSTERONE   baclofen (LIORESAL) 10 MG tablet TAKE 1 TABLET BY MOUTH EVERY DAY   BREZTRI AEROSPHERE 160-9-4.8 MCG/ACT AERO Inhale 2 puffs into the lungs 2 (two)  times daily.   budesonide-formoterol (SYMBICORT) 80-4.5 MCG/ACT inhaler Inhale 2 puffs into the lungs 2 (two) times daily as needed.   cromolyn (OPTICROM) 4 % ophthalmic solution SMARTSIG:1-2 Drop(s) In Eye(s) 4-6 Times Daily   dapagliflozin propanediol (FARXIGA) 10 MG TABS tablet Take 1 tablet (10 mg total) by mouth every evening.   diazepam (VALIUM) 10 MG tablet Take 10 mg by mouth every 6 (six) hours as needed for anxiety.   dicyclomine (BENTYL) 20 MG tablet  Take 20 mg by mouth in the morning and at bedtime.   Ipratropium-Albuterol (COMBIVENT) 20-100 MCG/ACT AERS respimat Inhale into the lungs as directed.   ketoconazole (NIZORAL) 2 % cream Apply 1 application topically daily as needed for irritation.   losartan (COZAAR) 25 MG tablet Take 1 tablet (25 mg total) by mouth daily.   lubiprostone (AMITIZA) 24 MCG capsule Take 24 mcg by mouth 2 (two) times daily.   Melatonin 5 MG CAPS Take 2 capsules by mouth at bedtime.   morphine (MS CONTIN) 30 MG 12 hr tablet Take 1 tablet (30 mg total) by mouth 2 (two) times daily.   rosuvastatin (CRESTOR) 20 MG tablet SMARTSIG:1 Tablet(s) By Mouth Every Evening   tamsulosin (FLOMAX) 0.4 MG CAPS capsule TAKE 1 CAPSULE BY MOUTH ONCE DAILY   TRIUMEQ 600-50-300 MG tablet Take 1 tablet by mouth daily.   VITAMIN D PO Take 1,000 Units by mouth daily.     Allergies:   Dovato [dolutegravir-lamivudine], Entresto [sacubitril-valsartan], and Metoprolol succinate [metoprolol]   Social History   Tobacco Use   Smoking status: Some Days    Packs/day: 1.00    Years: 39.00    Pack years: 39.00    Types: Cigarettes   Smokeless tobacco: Never   Tobacco comments:    Smokes about 6 cigarettes a week.  Vaping Use   Vaping Use: Former   Devices: tried when they first came out for about 2 weeks  Substance Use Topics   Alcohol use: Not Currently   Drug use: No     Family Hx: The patient's family history includes Alzheimer's disease in his mother; Breast cancer in his mother; Heart disease in his father.  ROS:   Please see the history of present illness.     All other systems reviewed and are negative.   Prior CV studies:   The following studies were reviewed today:  Echo 09/17/21  1. Left ventricular ejection fraction, by estimation, is 40 to 45%. The  left ventricle has mildly decreased function. The left ventricle  demonstrates global hypokinesis. Left ventricular diastolic parameters are  consistent with Grade I  diastolic  dysfunction (impaired relaxation).   2. Right ventricular systolic function is mildly reduced. The right  ventricular size is mildly enlarged. Tricuspid regurgitation signal is  inadequate for assessing PA pressure.   3. The inferior vena cava is dilated in size with >50% respiratory  variability, suggesting right atrial pressure of 8 mmHg.    Cardiac cath 09/16/21   Mid Cx lesion is 40% stenosed.   Mid RCA lesion is 30% stenosed.   Dist RCA lesion is 40% stenosed.   There is mild left ventricular systolic dysfunction.   LV end diastolic pressure is moderately elevated.   The left ventricular ejection fraction is 35-45% by visual estimate.   1.  Mild to moderate nonobstructive coronary artery disease. 2.  Moderately reduced LV systolic function with wall motion abnormality suggestive of stress-induced cardiomyopathy.  Mildly elevated left ventricular end-diastolic pressure.   Recommendations:  Recommend medical therapy for nonobstructive coronary artery disease. Obtain an echocardiogram to better evaluate wall motion.  Labs/Other Tests and Data Reviewed:    EKG:  No ECG reviewed.  Recent Labs: 07/21/2021: TSH 2.517 09/16/2021: ALT 8; B Natriuretic Peptide 48.7 11/08/2021: BUN 10; Creatinine, Ser 1.17; Potassium 4.5; Sodium 145 11/22/2021: Hemoglobin 16.3; Platelets 222   Recent Lipid Panel Lab Results  Component Value Date/Time   CHOL 138 09/17/2021 06:07 AM   TRIG 61 09/17/2021 06:07 AM   HDL 36 (L) 09/17/2021 06:07 AM   CHOLHDL 3.8 09/17/2021 06:07 AM   LDLCALC 90 09/17/2021 06:07 AM    Wt Readings from Last 3 Encounters:  11/29/21 158 lb (71.7 kg)  11/22/21 158 lb (71.7 kg)  10/31/21 154 lb 12.8 oz (70.2 kg)     Risk Assessment/Calculations:          Objective:    Vital Signs:  BP (!) 120/91   Pulse (!) 112   Ht 5' 10.5" (1.791 m)   Wt 158 lb (71.7 kg)   BMI 22.35 kg/m     ASSESSMENT & PLAN:    Nonobstructive CAD Prior cath 08/2021 showed  nonobstructive CAD. Patient denies anginal symptoms. Continue ASA and statin. He stopped metoprolol due to adverse side effects. Given this is a video visit, I will not consider initiation of Coreg at this time, but can consider at follow-up. Pulse noted to be high, however no symptoms reported by patient. No further ischemic work-up.   HFrEF NICM EF 40-45% Patient is tolerating Losartan and Farxiga well. Follow-up labs were stable. He denies SOB, orthopnea, pnd, LLE. Discussion around addition of spironolactone, however not wanting another medication. He is willing to re-check limited echo to reassess pump function. Plan for re-check limited echo with follow-up in the office.   HTN BP good today. Continue current medications.   HLD LDL 90. Continue Crestor.   Smoker/COPD Patient is still smoking, 3 cigarettes weekly.   HIV Followed by ID     COVID-19 Education: The signs and symptoms of COVID-19 were discussed with the patient and how to seek care for testing (follow up with PCP or arrange E-visit).  The importance of social distancing was discussed today.  Time:   Today, I have spent 30 minutes with the patient with telehealth technology discussing the above problems.     Medication Adjustments/Labs and Tests Ordered: Current medicines are reviewed at length with the patient today.  Concerns regarding medicines are outlined above.   Tests Ordered: Orders Placed This Encounter  Procedures   ECHOCARDIOGRAM LIMITED    Medication Changes: No orders of the defined types were placed in this encounter.   Follow Up:  In Person in 2 month(s)  Signed, Bergen Melle Ninfa Meeker, PA-C  11/29/2021 11:33 AM    [facility]

## 2021-11-29 NOTE — Patient Instructions (Signed)
Medication Instructions:   Your physician recommends that you continue on your current medications as directed. Please refer to the Current Medication list given to you today.  *If you need a refill on your cardiac medications before your next appointment, please call your pharmacy*   Lab Work:  None ordered  Testing/Procedures:  Your physician has requested that you have an echocardiogram. Echocardiography is a painless test that uses sound waves to create images of your heart. It provides your doctor with information about the size and shape of your heart and how well your heart's chambers and valves are working. This procedure takes approximately one hour. There are no restrictions for this procedure.   Follow-Up: At [facility], you and your health needs are our priority.  As part of our continuing mission to provide you with exceptional heart care, we have created designated Provider Care Teams.  These Care Teams include your primary Cardiologist (physician) and Advanced Practice Providers (APPs -  Physician Assistants and Nurse Practitioners) who all work together to provide you with the care you need, when you need it.  We recommend signing up for the patient portal called "MyChart".  Sign up information is provided on this After Visit Summary.  MyChart is used to connect with patients for Virtual Visits (Telemedicine).  Patients are able to view lab/test results, encounter notes, upcoming appointments, etc.  Non-urgent messages can be sent to your provider as well.   To learn more about what you can do with MyChart, go to [email or web address].    Your next appointment:   2 - 3 month(s)  The format for your next appointment:   In Person  Provider:   You may see Dr. Kathlyn Sacramento or one of the following Advanced Practice Providers on your designated Care Team:   Murray Hodgkins, NP Christell Faith, PA-C Cadence Kathlen Mody, [place]

## 2021-12-02 ENCOUNTER — Telehealth: Payer: Self-pay

## 2021-12-02 NOTE — Telephone Encounter (Signed)
Spoke with patient, relayed the below message from Dr. Delaine Lame.  Beryle Flock, RN

## 2021-12-02 NOTE — Telephone Encounter (Signed)
Patient called concerned that viral load was increased to 60. RN counseled that this is still considered undetectable and no need to be concerned as it appears he has been very adherent with his medication for many years.   Advised that labs can be rechecked in May, and that if any changes to his medication need to be made that Dr. Delaine Lame can discuss that with him then.   Advised him to keep up the great work with taking his medication daily and emphasized that his viral load is not concerning. He would still like to hear this from Dr. Delaine Lame herself as he's very nervous.   Beryle Flock, RN

## 2021-12-05 DIAGNOSIS — M545 Low back pain, unspecified: Secondary | ICD-10-CM | POA: Diagnosis not present

## 2021-12-05 DIAGNOSIS — F17210 Nicotine dependence, cigarettes, uncomplicated: Secondary | ICD-10-CM | POA: Diagnosis not present

## 2021-12-05 DIAGNOSIS — G894 Chronic pain syndrome: Secondary | ICD-10-CM | POA: Diagnosis not present

## 2021-12-05 DIAGNOSIS — M546 Pain in thoracic spine: Secondary | ICD-10-CM | POA: Diagnosis not present

## 2021-12-05 DIAGNOSIS — M150 Primary generalized (osteo)arthritis: Secondary | ICD-10-CM | POA: Diagnosis not present

## 2021-12-05 DIAGNOSIS — Z79891 Long term (current) use of opiate analgesic: Secondary | ICD-10-CM | POA: Diagnosis not present

## 2021-12-05 DIAGNOSIS — C148 Malignant neoplasm of overlapping sites of lip, oral cavity and pharynx: Secondary | ICD-10-CM | POA: Diagnosis not present

## 2021-12-05 DIAGNOSIS — G8929 Other chronic pain: Secondary | ICD-10-CM | POA: Diagnosis not present

## 2021-12-13 ENCOUNTER — Telehealth: Payer: Self-pay | Admitting: Cardiovascular Disease

## 2021-12-13 NOTE — Telephone Encounter (Signed)
Spoke with the patient. Advised the patient that our office has not ordered any recent labs. Last labs were 11/08/21 and he was called with the results.  Patient sts that he was looking through old AVS and was not sure.  Patient voiced appreciation for the call back.

## 2021-12-13 NOTE — Telephone Encounter (Signed)
Patient calling for Blood test results

## 2021-12-23 DIAGNOSIS — J301 Allergic rhinitis due to pollen: Secondary | ICD-10-CM | POA: Diagnosis not present

## 2021-12-23 DIAGNOSIS — L240 Irritant contact dermatitis due to detergents: Secondary | ICD-10-CM | POA: Diagnosis not present

## 2021-12-23 DIAGNOSIS — G3184 Mild cognitive impairment, so stated: Secondary | ICD-10-CM | POA: Diagnosis not present

## 2021-12-23 DIAGNOSIS — E785 Hyperlipidemia, unspecified: Secondary | ICD-10-CM | POA: Diagnosis not present

## 2021-12-23 DIAGNOSIS — J449 Chronic obstructive pulmonary disease, unspecified: Secondary | ICD-10-CM | POA: Diagnosis not present

## 2021-12-23 DIAGNOSIS — E559 Vitamin D deficiency, unspecified: Secondary | ICD-10-CM | POA: Diagnosis not present

## 2021-12-27 ENCOUNTER — Telehealth: Payer: Self-pay

## 2021-12-27 NOTE — Telephone Encounter (Signed)
Patient has a spot that is brown on hip. Skin is dry also. PCP is treating this but he wonders if it is related to HIV? Advised him that we would need to see it. He did state he feels better no pain since using cream. H edoes think it could be bed sore as he sleeps on that side. If cream does not work he has agreed to call office and schedule appt to see if this is skin issue related to HIV.

## 2022-01-02 DIAGNOSIS — Z79891 Long term (current) use of opiate analgesic: Secondary | ICD-10-CM | POA: Diagnosis not present

## 2022-01-02 DIAGNOSIS — G8929 Other chronic pain: Secondary | ICD-10-CM | POA: Diagnosis not present

## 2022-01-02 DIAGNOSIS — M545 Low back pain, unspecified: Secondary | ICD-10-CM | POA: Diagnosis not present

## 2022-01-02 DIAGNOSIS — M546 Pain in thoracic spine: Secondary | ICD-10-CM | POA: Diagnosis not present

## 2022-01-02 DIAGNOSIS — M150 Primary generalized (osteo)arthritis: Secondary | ICD-10-CM | POA: Diagnosis not present

## 2022-01-02 DIAGNOSIS — M542 Cervicalgia: Secondary | ICD-10-CM | POA: Diagnosis not present

## 2022-01-02 DIAGNOSIS — G894 Chronic pain syndrome: Secondary | ICD-10-CM | POA: Diagnosis not present

## 2022-01-02 DIAGNOSIS — F17210 Nicotine dependence, cigarettes, uncomplicated: Secondary | ICD-10-CM | POA: Diagnosis not present

## 2022-01-06 ENCOUNTER — Other Ambulatory Visit: Payer: Self-pay

## 2022-01-06 ENCOUNTER — Ambulatory Visit (INDEPENDENT_AMBULATORY_CARE_PROVIDER_SITE_OTHER): Payer: Commercial Managed Care - HMO

## 2022-01-06 DIAGNOSIS — I428 Other cardiomyopathies: Secondary | ICD-10-CM | POA: Diagnosis not present

## 2022-01-06 LAB — ECHOCARDIOGRAM LIMITED
Area-P 1/2: 2.58 cm2
S' Lateral: 3.1 cm
Single Plane A4C EF: 47.2 %

## 2022-01-06 MED ORDER — PERFLUTREN LIPID MICROSPHERE
1.0000 mL | INTRAVENOUS | Status: AC | PRN
  Administered 2022-01-06: 2 mL via INTRAVENOUS

## 2022-01-09 DIAGNOSIS — G894 Chronic pain syndrome: Secondary | ICD-10-CM | POA: Diagnosis not present

## 2022-01-18 DIAGNOSIS — H2513 Age-related nuclear cataract, bilateral: Secondary | ICD-10-CM | POA: Diagnosis not present

## 2022-01-18 DIAGNOSIS — H04123 Dry eye syndrome of bilateral lacrimal glands: Secondary | ICD-10-CM | POA: Diagnosis not present

## 2022-01-30 DIAGNOSIS — Z79891 Long term (current) use of opiate analgesic: Secondary | ICD-10-CM | POA: Diagnosis not present

## 2022-01-30 DIAGNOSIS — M546 Pain in thoracic spine: Secondary | ICD-10-CM | POA: Diagnosis not present

## 2022-01-30 DIAGNOSIS — M150 Primary generalized (osteo)arthritis: Secondary | ICD-10-CM | POA: Diagnosis not present

## 2022-01-30 DIAGNOSIS — G894 Chronic pain syndrome: Secondary | ICD-10-CM | POA: Diagnosis not present

## 2022-01-30 DIAGNOSIS — F17210 Nicotine dependence, cigarettes, uncomplicated: Secondary | ICD-10-CM | POA: Diagnosis not present

## 2022-01-30 DIAGNOSIS — M545 Low back pain, unspecified: Secondary | ICD-10-CM | POA: Diagnosis not present

## 2022-01-30 DIAGNOSIS — M542 Cervicalgia: Secondary | ICD-10-CM | POA: Diagnosis not present

## 2022-01-30 DIAGNOSIS — G8929 Other chronic pain: Secondary | ICD-10-CM | POA: Diagnosis not present

## 2022-02-06 ENCOUNTER — Telehealth: Payer: Self-pay | Admitting: Medical

## 2022-02-06 NOTE — Telephone Encounter (Signed)
Pt c/o medication issue:  1. Name of Medication: entresto  2. How are you currently taking this medication (dosage and times per day)? Not sure  3. Are you having a reaction (difficulty breathing--STAT)? no  4. What is your medication issue? Patient not sure if he is supposed to be taking this .  Discussed with a provider but never received a bottle .  Please confirm if taking.

## 2022-02-07 MED ORDER — DAPAGLIFLOZIN PROPANEDIOL 10 MG PO TABS: 10.0000 mg | ORAL_TABLET | Freq: Every evening | ORAL | 2 refills | Status: DC

## 2022-02-07 NOTE — Telephone Encounter (Signed)
Called and spoke with patient.   Told him that per the OV note from November with Cadence Furth, [place] Delene Loll was stopped due to dizziness.   On phone with patient for 25 minutes while he rambled about his pain medication doctor and the fact that he recently missed an appointment. Also went on about how he didn't realize that his phone has another number that has voicemail and he just discovered this.   Pt also needed assistance with his MyChart, has not been able to get in it since December.   Additionally, patient asked about what medications he is on that are related to his heart. Medications reviewed. Patient's medication list has [name] on it, however per patient he does not have this and is unsure if he has been taking it. States it must have been discontinued since the pharmacy didn't fill it. Then referenced an AVS from October. Explained to patient that per December OV note, patient is to be on Farxiga. Refills sent in for patient.   Pt had additional questions that I explained would have to be addressed at his next OV. Pt verbalized understanding.

## 2022-02-21 DIAGNOSIS — E785 Hyperlipidemia, unspecified: Secondary | ICD-10-CM | POA: Diagnosis not present

## 2022-02-21 DIAGNOSIS — I10 Essential (primary) hypertension: Secondary | ICD-10-CM | POA: Diagnosis not present

## 2022-02-24 DIAGNOSIS — E785 Hyperlipidemia, unspecified: Secondary | ICD-10-CM | POA: Diagnosis not present

## 2022-02-24 DIAGNOSIS — J301 Allergic rhinitis due to pollen: Secondary | ICD-10-CM | POA: Diagnosis not present

## 2022-02-24 DIAGNOSIS — G8929 Other chronic pain: Secondary | ICD-10-CM | POA: Diagnosis not present

## 2022-02-24 DIAGNOSIS — L240 Irritant contact dermatitis due to detergents: Secondary | ICD-10-CM | POA: Diagnosis not present

## 2022-02-24 DIAGNOSIS — R110 Nausea: Secondary | ICD-10-CM | POA: Diagnosis not present

## 2022-02-24 DIAGNOSIS — E559 Vitamin D deficiency, unspecified: Secondary | ICD-10-CM | POA: Diagnosis not present

## 2022-03-03 ENCOUNTER — Telehealth: Payer: Self-pay | Admitting: Cardiovascular Disease

## 2022-03-03 NOTE — Telephone Encounter (Signed)
Pt c/o medication issue: ? ?1. Name of Medication: [name]  ? ?2. How are you currently taking this medication (dosage and times per day)? 10 MG 1 tablet every evening  ? ?3. Are you having a reaction (difficulty breathing--STAT)? Red rash around genitales  ? ?4. What is your medication issue? Patient states he did google research to see if any of his medications could cause this and farxiga came up.  Would like to discuss.  ? ?

## 2022-03-03 NOTE — Telephone Encounter (Signed)
Spoke with patient.  ? ?Patient reports rash in genital area around penis. Patient reports that he just now noticed how bad it is, previously he just thought it was a rash from sweating in the genital area. Reports that it's red and kind of swollen, but is not itching. Denies any odor as he says that "I probably have the cleanest crotch in the [place]." Patient has reportedly been washing the area a lot.  He has been using hydrocortisone and a cream that he had previously gotten from his PCP awhile back. Also is using a powder to try and keep the area dry. I encouraged patient to discontinue use of powder in genital area as this could further irritate the area.  ? ?Unable to get clear answer on exactly when rash started, but patient thinks it's related to [place] based on Internet search, but is also wondering if it might be related to the Flomax since he think the rash started to get worse when he started to take his Flomax in the morning instead of at night.  ? ?Explained to patient that because we couldn't be sure that the rash is a yeast infection related to the [place], he needed to be seen by either his PCP or by urgent care to rule out other causes of the rash. Pt verbalized understanding and stated that he would call his PCP's office to see if they could get him in on Monday, and if not he would go to Urgent Care. Stated that he would call back to let us know what the diagnosis is so that we could let him know if he should continue taking the [place] or not.   ?

## 2022-03-07 NOTE — Telephone Encounter (Addendum)
Patient made aware of PharmDs response and recommendation. ?Patient sts that he will continue farxiga as prescribed. ?Patient verbalized understanding and voiced appreciation for the assistance. ? ?

## 2022-03-07 NOTE — Telephone Encounter (Signed)
Pt c/o medication issue: ? ?1. Name of Medication: farxiga ? ?2. How are you currently taking this medication (dosage and times per day)? 10 mg po q evening  ? ?3. Are you having a reaction (difficulty breathing--STAT)? Rash/ yeast infection  ? ?4. What is your medication issue? Patient addressed issue with pcp and was given diflucan for yeast infection .  Patient wants to know if this is going to be an ongoing issue / reaction with this medication .  ? ?

## 2022-03-07 NOTE — Telephone Encounter (Signed)
Will fwd to PharmD and ordering provider Cadence Kathlen Mody, [place] to advise. ?

## 2022-03-07 NOTE — Telephone Encounter (Signed)
Yeast infections from SGLT2i respond well to treatment, so the Diflucan from his PCP should resolve his symptoms. Generally they do not reoccur, and ideally would prefer pt continue on Farxiga if possible given his heart failure. If he does develop recurrent yeast infections that are bothersome, could discontinue SGLT2i at that time. ?

## 2022-03-13 NOTE — Progress Notes (Signed)
?Cardiology Office Note:   ? ?Date:  03/16/2022  ? ?ID:  GRANGER CHUI, DOB [birth year], MRN [id number] ? ?PCP:  Jodi Marble, MD  ?[name] ?[place] Electrophysiologist:  None  ? ?Chief Complaint: 3 month follow-up ? ?History of Present Illness:   ? ?Albert Wong is a 66 y.o. male with a hx of HIV disease diagnosed in early 48s, treated hepatitis, COPD, NICM, HFmrEF, nonobstructive CAD by cath 08/2021, HTN tobacco use who is being seen for follow-up.  ?  ?Patient came into the [facility] ER 09/16/21 for shortness of breath. Patient's HIV medications were recently switched to [place].Hst troponin was elevated. LHC showed mild to moderate nonobstructive CAD with moderately reduced LVSF, mildly elevated LVEDP. Started on Aspirin, Lopressor, crestor, jardiance, lasix '20mg'$  daily. Echo showed LVEF 40-45%, g1dd.  ?  ?Last seen 09/29/21 and was overall doing well. He was referred to cardiac rehab. Started on [place], which he ultimately stopped bc he didn't tolerate it.  ?  ?Last seen 11/29/21 in televisit and was overall doing better. Limited echo was ordered which showed minimal improvement of LVEF to 45-50%.  ? ?Today, the patient reports he is doing well. He was given antiviral for yeast infection as well as a cream, reported resolution of symptoms. He is still taking [place]. No chest pain or SOB. No LLE, orthopnea, pnd. BP borderline today, but says it's higher at home. No other changes.  ? ?Past Medical History:  ?Diagnosis Date  ? Anxiety   ? Chronic nausea   ? Degenerative disk disease   ? HIV (human immunodeficiency virus infection) ([place])   ? HIV (human immunodeficiency virus infection) ([place])   ? Insomnia   ? Muscle spasm   ? Rotator cuff tear   ? ? ?Past Surgical History:  ?Procedure Laterality Date  ? dental procedure N/A   ? LEFT HEART CATH AND CORONARY ANGIOGRAPHY N/A 09/16/2021  ? Procedure: LEFT HEART CATH AND CORONARY ANGIOGRAPHY;  Surgeon: Wellington Hampshire, MD;  Location: [facility];  Service: Cardiovascular;  Laterality: N/A;  ? ? ?Current Medications: ?Current Meds  ?Medication Sig  ? aspirin EC 81 MG EC tablet Take 1 tablet (81 mg total) by mouth daily. Swallow whole.  ? B-D 3CC LUER-LOK SYR 21GX1" 21G X 1" 3 ML MISC USE AS DIRECTED WITH TESTOSTERONE  ? baclofen (LIORESAL) 10 MG tablet TAKE 1 TABLET BY MOUTH EVERY DAY  ? BREZTRI AEROSPHERE 160-9-4.8 MCG/ACT AERO Inhale 2 puffs into the lungs 2 (two) times daily.  ? budesonide-formoterol (SYMBICORT) 80-4.5 MCG/ACT inhaler Inhale 2 puffs into the lungs 2 (two) times daily as needed.  ? cromolyn (OPTICROM) 4 % ophthalmic solution SMARTSIG:1-2 Drop(s) In Eye(s) 4-6 Times Daily  ? dapagliflozin propanediol (FARXIGA) 10 MG TABS tablet Take 1 tablet (10 mg total) by mouth every evening.  ? diazepam (VALIUM) 10 MG tablet Take 10 mg by mouth every 6 (six) hours as needed for anxiety.  ? dicyclomine (BENTYL) 20 MG tablet Take 20 mg by mouth in the morning and at bedtime.  ? Ipratropium-Albuterol (COMBIVENT) 20-100 MCG/ACT AERS respimat Inhale into the lungs as directed.  ? ketoconazole (NIZORAL) 2 % cream Apply 1 application topically daily as needed for irritation.  ? losartan (COZAAR) 25 MG tablet Take 1 tablet (25 mg total) by mouth daily.  ? lubiprostone (AMITIZA) 24 MCG capsule Take 24 mcg by mouth 2 (two) times daily.  ? Melatonin 5 MG CAPS Take 2 capsules by mouth  at bedtime.  ? morphine (MS CONTIN) 30 MG 12 hr tablet Take 1 tablet (30 mg total) by mouth 2 (two) times daily.  ? rosuvastatin (CRESTOR) 20 MG tablet SMARTSIG:1 Tablet(s) By Mouth Every Evening  ? tamsulosin (FLOMAX) 0.4 MG CAPS capsule TAKE 1 CAPSULE BY MOUTH ONCE DAILY  ? TRIUMEQ 600-50-300 MG tablet Take 1 tablet by mouth daily.  ? VITAMIN D PO Take 1,000 Units by mouth daily.  ?  ? ?Allergies:   Dovato [dolutegravir-lamivudine], Entresto [sacubitril-valsartan], and Metoprolol succinate [metoprolol]  ? ?Social History  ? ?Socioeconomic History  ? Marital status:  Single  ?  Spouse name: Not on file  ? Number of children: Not on file  ? Years of education: Not on file  ? Highest education level: Not on file  ?Occupational History  ? Not on file  ?Tobacco Use  ? Smoking status: Some Days  ?  Packs/day: 1.00  ?  Years: 39.00  ?  Pack years: 39.00  ?  Types: Cigarettes  ? Smokeless tobacco: Never  ? Tobacco comments:  ?  Smokes about 6 cigarettes a week.  ?Vaping Use  ? Vaping Use: Former  ? Devices: tried when they first came out for about 2 weeks  ?Substance and Sexual Activity  ? Alcohol use: Not Currently  ? Drug use: No  ? Sexual activity: Not on file  ?Other Topics Concern  ? Not on file  ?Social History Narrative  ? Not on file  ? ?Social Determinants of Health  ? ?Financial Resource Strain: Not on file  ?Food Insecurity: Not on file  ?Transportation Needs: Not on file  ?Physical Activity: Not on file  ?Stress: Not on file  ?Social Connections: Not on file  ?  ? ?Family History: ?The patient's family history includes Alzheimer's disease in his mother; Breast cancer in his mother; Heart disease in his father. ? ?ROS:   ?Please see the history of present illness.    ? All other systems reviewed and are negative. ? ?EKGs/Labs/Other Studies Reviewed:   ? ?The following studies were reviewed today: ? ?Echo limited 12/2021 ? 1. Left ventricular ejection fraction, by estimation, is 45 to 50%. The  ?left ventricle has mildly decreased function. The left ventricle  ?demonstrates global hypokinesis. Left ventricular diastolic parameters are  ?consistent with Grade I diastolic  ?dysfunction (impaired relaxation).  ? 2. Right ventricular systolic function is normal. The right ventricular  ?size is normal. Tricuspid regurgitation signal is inadequate for assessing  ?PA pressure.  ? 3. The mitral valve is normal in structure. No evidence of mitral valve  ?regurgitation. No evidence of mitral stenosis.  ? 4. The aortic valve is normal in structure. Aortic valve regurgitation is  ?not  visualized. No aortic stenosis is present.  ? 5. The inferior vena cava is normal in size with greater than 50%  ?respiratory variability, suggesting right atrial pressure of 3 mmHg ? ? ?Echo 08/2022 ?1. Left ventricular ejection fraction, by estimation, is 40 to 45%. The  ?left ventricle has mildly decreased function. The left ventricle  ?demonstrates global hypokinesis. Left ventricular diastolic parameters are  ?consistent with Grade I diastolic  ?dysfunction (impaired relaxation).  ? 2. Right ventricular systolic function is mildly reduced. The right  ?ventricular size is mildly enlarged. Tricuspid regurgitation signal is  ?inadequate for assessing PA pressure.  ? 3. The inferior vena cava is dilated in size with >50% respiratory  ?variability, suggesting right atrial pressure of 8 mmHg.  ? ?Cardiac cath  09/16/21 ?  Mid Cx lesion is 40% stenosed. ?  Mid RCA lesion is 30% stenosed. ?  Dist RCA lesion is 40% stenosed. ?  There is mild left ventricular systolic dysfunction. ?  LV end diastolic pressure is moderately elevated. ?  The left ventricular ejection fraction is 35-45% by visual estimate. ?  ?1.  Mild to moderate nonobstructive coronary artery disease. ?2.  Moderately reduced LV systolic function with wall motion abnormality suggestive of stress-induced cardiomyopathy.  Mildly elevated left ventricular end-diastolic pressure. ?  ?Recommendations: ?Recommend medical therapy for nonobstructive coronary artery disease. ?Obtain an echocardiogram to better evaluate wall motion. ?  ? ?EKG:  EKG is  ordered today.  The ekg ordered today demonstrates NSR 86bpm, TWI aVL ? ?Recent Labs: ?07/21/2021: TSH 2.517 ?09/16/2021: ALT 8; B Natriuretic Peptide 48.7 ?11/08/2021: BUN 10; Creatinine, Ser 1.17; Potassium 4.5; Sodium 145 ?11/22/2021: Hemoglobin 16.3; Platelets 222  ?Recent Lipid Panel ?   ?Component Value Date/Time  ? CHOL 138 09/17/2021 0607  ? TRIG 61 09/17/2021 0607  ? HDL 36 (L) 09/17/2021 7654  ? CHOLHDL 3.8  09/17/2021 0607  ? VLDL 12 09/17/2021 0607  ? [place] 90 09/17/2021 0607  ? ? ?Physical Exam:   ? ?VS:  BP (!) 100/58 (BP Location: Left Arm, Patient Position: Sitting, Cuff Size: Normal)   Pulse 86   Ht 5' 1

## 2022-03-16 ENCOUNTER — Other Ambulatory Visit: Payer: Self-pay

## 2022-03-16 ENCOUNTER — Ambulatory Visit (INDEPENDENT_AMBULATORY_CARE_PROVIDER_SITE_OTHER): Payer: Medicare Other | Admitting: Medical

## 2022-03-16 ENCOUNTER — Encounter: Payer: Self-pay | Admitting: Medical

## 2022-03-16 VITALS — BP 100/58 | HR 86 | Ht 70.5 in | Wt 150.5 lb

## 2022-03-16 DIAGNOSIS — B20 Human immunodeficiency virus [HIV] disease: Secondary | ICD-10-CM

## 2022-03-16 DIAGNOSIS — E782 Mixed hyperlipidemia: Secondary | ICD-10-CM

## 2022-03-16 DIAGNOSIS — Z720 Tobacco use: Secondary | ICD-10-CM | POA: Diagnosis not present

## 2022-03-16 DIAGNOSIS — I428 Other cardiomyopathies: Secondary | ICD-10-CM | POA: Diagnosis not present

## 2022-03-16 DIAGNOSIS — I5022 Chronic systolic (congestive) heart failure: Secondary | ICD-10-CM | POA: Diagnosis not present

## 2022-03-16 DIAGNOSIS — J449 Chronic obstructive pulmonary disease, unspecified: Secondary | ICD-10-CM

## 2022-03-16 DIAGNOSIS — I2510 Atherosclerotic heart disease of native coronary artery without angina pectoris: Secondary | ICD-10-CM

## 2022-03-16 DIAGNOSIS — Z21 Asymptomatic human immunodeficiency virus [HIV] infection status: Secondary | ICD-10-CM

## 2022-03-16 NOTE — Patient Instructions (Signed)

## 2022-03-24 DIAGNOSIS — I110 Hypertensive heart disease with heart failure: Secondary | ICD-10-CM | POA: Diagnosis not present

## 2022-03-24 DIAGNOSIS — I2510 Atherosclerotic heart disease of native coronary artery without angina pectoris: Secondary | ICD-10-CM | POA: Diagnosis not present

## 2022-03-24 DIAGNOSIS — I5022 Chronic systolic (congestive) heart failure: Secondary | ICD-10-CM | POA: Diagnosis not present

## 2022-03-30 DIAGNOSIS — M545 Low back pain, unspecified: Secondary | ICD-10-CM | POA: Diagnosis not present

## 2022-04-03 ENCOUNTER — Telehealth: Payer: Self-pay | Admitting: Cardiovascular Disease

## 2022-04-03 NOTE — Telephone Encounter (Signed)
Spoke with pt, he had an MI about 4 months ago. He has one prescription left and he is trying to find a new pain doctor. He is having to take morphine due to back problems. Explained to the patient that the with drawl can cause elevated heart rate and high blood pressure. He is afraid he will have another MI. Aware will forward to cadence furth np to review.  ?

## 2022-04-03 NOTE — Telephone Encounter (Signed)
Patient states his pain doctor dropped him because insurance would not pay for services. He states this left him without a doctor and had only 2 pills left. He says his PCP sent him to [facility], but that doctor does not write pain medications. He says the only thing that helps his  back is morphine. He says 1-6 disks are messed up in his back, because he has been taking HIV medication for years. He states due to this he has neuropathy, arthritis, and bone spurs. He says there is no way for an operation and his doctor in [facility] said he should not have surgery unless he can't walk. He states he is wondering since he has  heart attack, if he will have another one when he starts going threw withdrawals.   ?

## 2022-04-05 ENCOUNTER — Other Ambulatory Visit: Payer: Self-pay | Admitting: Medical

## 2022-04-11 NOTE — Telephone Encounter (Signed)
Spoke with pt, his blood pressure is running 117/80/80. He has still not found a pain doctor. He will continue to monitor his blood pressure and let us know if elevated. ?

## 2022-04-19 ENCOUNTER — Ambulatory Visit (INDEPENDENT_AMBULATORY_CARE_PROVIDER_SITE_OTHER): Payer: Medicare Other | Admitting: Dermatology

## 2022-04-19 ENCOUNTER — Encounter: Payer: Self-pay | Admitting: Dermatology

## 2022-04-19 DIAGNOSIS — L7211 Pilar cyst: Secondary | ICD-10-CM

## 2022-04-19 NOTE — Progress Notes (Signed)
? ?  New Patient Visit ? ?Subjective  ?Albert Wong is a 65 y.o. male who presents for the following: Cyst (Dur: couple of years. Right temporal scalp. Growing. Causing headaches when pressure is applied. Sleeps on right side at night. Painful when wearing a hat. Here for removal). ?The patient has spots, moles and lesions to be evaluated, some may be new or changing and the patient has concerns that these could be cancer. ? ?Review of Systems: No other skin or systemic complaints except as noted in HPI or Assessment and Plan. ? ?Objective  ?Well appearing patient in no apparent distress; mood and affect are within normal limits. ? ?A focused examination was performed including head, including the scalp, face, neck, nose, ears, eyelids, and lips. Relevant physical exam findings are noted in the Assessment and Plan. ? ?Right Temporal Scalp Within Hair Line ?1.1 cm subcutaneous cystic nodule ? ? ? ? ? ?Assessment & Plan  ?Pilar cyst ?Right Temporal Scalp Within Hair Line ?Vs epidermal cyst ?Benign-appearing. Exam most consistent with a pilar cyst. Discussed that a cyst is a benign growth that can grow over time and sometimes get irritated or inflamed. Recommend observation if it is not bothersome. Discussed option of surgical excision to remove it if it is growing, symptomatic, or other changes noted. Please call for new or changing lesions so they can be evaluated.  ? ?Return for surgical removal.. ? ?I, Emelia Salisbury, CMA, am acting as scribe for Sarina Ser, MD. ?Documentation: I have reviewed the above documentation for accuracy and completeness, and I agree with the above. ? ?Sarina Ser, MD ? ?

## 2022-04-19 NOTE — Patient Instructions (Addendum)
? ?Pre-Operative Instructions ? ?You are scheduled for a surgical procedure at [facility]. We recommend you read the following instructions. If you have any questions or concerns, please call the office at [phone]. ? ?Shower and wash the entire body with soap and water the day of your surgery paying special attention to cleansing at and around the planned surgery site. ? ?Avoid aspirin or aspirin containing products at least fourteen (14) days prior to your surgical procedure and for at least one week (7 Days) after your surgical procedure. If you take aspirin on a regular basis for heart disease or history of stroke or for any other reason, we may recommend you continue taking aspirin but please notify us if you take this on a regular basis. Aspirin can cause more bleeding to occur during surgery as well as prolonged bleeding and bruising after surgery.  ? ?Avoid other nonsteroidal pain medications at least one week prior to surgery and at least one week prior to your surgery. These include medications such as Ibuprofen (Motrin, Advil and Nuprin), Naprosyn, Voltaren, Relafen, etc. If medications are used for therapeutic reasons, please inform us as they can cause increased bleeding or prolonged bleeding during and bruising after surgical procedures.  ? ?Please advise [place] if you are taking any "blood thinner" medications such as Coumadin or Dipyridamole or Plavix or similar medications. These cause increased bleeding and prolonged bleeding during procedures and bruising after surgical procedures. We may have to consider discontinuing these medications briefly prior to and shortly after your surgery if safe to do so.  ? ?Please inform us of all medications you are currently taking. All medications that are taken regularly should be taken the day of surgery as you always do. Nevertheless, we need to be informed of what medications you are taking prior to surgery to know whether they will affect the  procedure or cause any complications.  ? ?Please inform us of any medication allergies. Also inform us of whether you have allergies to Latex or rubber products or whether you have had any adverse reaction to Lidocaine or Epinephrine. ? ?Please inform us of any prosthetic or artificial body parts such as artificial heart valve, joint replacements, etc., or similar condition that might require preoperative antibiotics.  ? ?We recommend avoidance of alcohol at least two weeks prior to surgery and continued avoidance for at least two weeks after surgery.  ? ?We recommend discontinuation of tobacco smoking at least two weeks prior to surgery and continued abstinence for at least two weeks after surgery. ? ?Do not plan strenuous exercise, strenuous work or strenuous lifting for approximately four weeks after your surgery.  ? ?We request if you are unable to make your scheduled surgical appointment, please call us at least a week in advance or as soon as you are aware of a problem so that we can cancel or reschedule the appointment.  ? ?You MAY TAKE TYLENOL (acetaminophen) for pain as it is not a blood thinner.  ? ?PLEASE PLAN TO BE IN TOWN FOR TWO WEEKS FOLLOWING SURGERY, THIS IS IMPORTANT SO YOU CAN BE CHECKED FOR DRESSING CHANGES, SUTURE REMOVAL AND TO MONITOR FOR POSSIBLE COMPLICATIONS.  ? ?Call back to schedule surgical removal. ? ?If You Need Anything After Your Visit ? ?If you have any questions or concerns for your doctor, please call our main line at [phone] and press option 4 to reach your doctor's medical assistant. If no one answers, please leave a voicemail as directed and we  will return your call as soon as possible. Messages left after 4 pm will be answered the following business day.  ? ?You may also send [place] a message via MyChart. We typically respond to MyChart messages within 1-2 business days. ? ?For prescription refills, please ask your pharmacy to contact our office. Our fax number is  [phone]. ? ?If you have an urgent issue when the clinic is closed that cannot wait until the next business day, you can page your doctor at the number below.   ? ?Please note that while we do our best to be available for urgent issues outside of office hours, we are not available 24/7.  ? ?If you have an urgent issue and are unable to reach [place], you may choose to seek medical care at your doctor's office, retail clinic, urgent care center, or emergency room. ? ?If you have a medical emergency, please immediately call 911 or go to the emergency department. ? ?Pager Numbers ? ?- Dr. Nehemiah Massed: [phone] ? ?- Dr. Laurence Ferrari: [phone] ? ?- Dr. Nicole Kindred: [phone] ? ?In the event of inclement weather, please call our main line at [phone] for an update on the status of any delays or closures. ? ?Dermatology Medication Tips: ?Please keep the boxes that topical medications come in in order to help keep track of the instructions about where and how to use these. Pharmacies typically print the medication instructions only on the boxes and not directly on the medication tubes.  ? ?If your medication is too expensive, please contact our office at [phone] option 4 or send [place] a message through [place].  ? ?We are unable to tell what your co-pay for medications will be in advance as this is different depending on your insurance coverage. However, we may be able to find a substitute medication at lower cost or fill out paperwork to get insurance to cover a needed medication.  ? ?If a prior authorization is required to get your medication covered by your insurance company, please allow [place] 1-2 business days to complete this process. ? ?Drug prices often vary depending on where the prescription is filled and some pharmacies may offer cheaper prices. ? ?The website www.goodrx.com contains coupons for medications through different pharmacies. The prices here do not account for what the cost may be with help from  insurance (it may be cheaper with your insurance), but the website can give you the price if you did not use any insurance.  ?- You can print the associated coupon and take it with your prescription to the pharmacy.  ?- You may also stop by our office during regular business hours and pick up a GoodRx coupon card.  ?- If you need your prescription sent electronically to a different pharmacy, notify our office through [facility] or by phone at [phone] option 4. ? ? ? ? ?Si Usted Necesita Algo Despu?s de Su Visita ? ?Tambi?n puede enviarnos un mensaje a trav?s de MyChart. Por lo general respondemos a los mensajes de MyChart en el transcurso de 1 a 2 d?as h?biles. ? ?Para renovar recetas, por favor pida a su farmacia que se ponga en contacto con nuestra oficina. Nuestro n?mero de fax es el [phone]. ? ?Si tiene un asunto urgente cuando la cl?nica est? cerrada y que no puede esperar hasta el siguiente d?a h?bil, puede llamar/localizar a su doctor(a) al n?mero que aparece a continuaci?n.  ? ?Por favor, tenga en cuenta que aunque hacemos todo lo posible para estar disponibles para  asuntos urgentes fuera del horario de oficina, no estamos disponibles las 24 horas del d?a, los 7 d?as de la semana.  ? ?Si tiene un problema urgente y no puede comunicarse con nosotros, puede optar por buscar atenci?n m?dica  en el consultorio de su doctor(a), en una cl?nica privada, en un centro de atenci?n urgente o en una sala de emergencias. ? ?Si tiene Engineer, maintenance (IT) m?dica, por favor llame inmediatamente al 911 o vaya a la sala de emergencias. ? ?N?meros de b?per ? ?- Dr. Nehemiah Massed: [phone] ? ?- Dra. Moye: [phone] ? ?- Dra. Nicole Kindred: [phone] ? ?En caso de inclemencias del tiempo, por favor llame a nuestra l?nea principal al [phone] para una actualizaci?n sobre el estado de cualquier retraso o cierre. ? ?Consejos para la medicaci?n en dermatolog?a: ?Por favor, guarde las cajas en las que vienen los  medicamentos de uso t?pico para ayudarle a seguir las instrucciones sobre d?nde y c?mo usarlos. Las farmacias generalmente imprimen las instrucciones del medicamento s?lo en las cajas y no directamente en los tubos del medicamen

## 2022-04-26 ENCOUNTER — Telehealth: Payer: Self-pay

## 2022-04-26 NOTE — Telephone Encounter (Signed)
Patient called office today requesting assistance for pain management. States that he only has two weeks left of his Morphine, and is requesting referral for pain clinic that will write prescription to continue oral pain medicine.  ?States that he spoke with his PCP who provided recommendation to providers who only do injectable pain management.  ?Will forward message to provider. Advised patient follow up with PCP again and see if they could provide alternative pain clinics. ?Leatrice Jewels, RMA  ?

## 2022-04-26 NOTE — Telephone Encounter (Signed)
Spoke with provider regarding patient's concern. Per MD she is not able to provide referral for pain clinic since she is not managing this. Recommended patient call Dr. Elwyn Lade to see if they could assist. ?Patient verbalized understanding ?Leatrice Jewels, RMA  ?

## 2022-04-30 ENCOUNTER — Encounter: Payer: Self-pay | Admitting: Dermatology

## 2022-05-01 ENCOUNTER — Telehealth: Payer: Self-pay

## 2022-05-01 DIAGNOSIS — J301 Allergic rhinitis due to pollen: Secondary | ICD-10-CM | POA: Diagnosis not present

## 2022-05-01 DIAGNOSIS — I10 Essential (primary) hypertension: Secondary | ICD-10-CM | POA: Diagnosis not present

## 2022-05-01 DIAGNOSIS — L240 Irritant contact dermatitis due to detergents: Secondary | ICD-10-CM | POA: Diagnosis not present

## 2022-05-01 DIAGNOSIS — E559 Vitamin D deficiency, unspecified: Secondary | ICD-10-CM | POA: Diagnosis not present

## 2022-05-01 DIAGNOSIS — E785 Hyperlipidemia, unspecified: Secondary | ICD-10-CM | POA: Diagnosis not present

## 2022-05-01 NOTE — Telephone Encounter (Signed)
Patient called and stated that he is planning to stop taking all of his medications, including his HIV medications, and asks if he will have withdrawal symptoms if he quits everything at one time. ? ?Per patient, he only has 7-8 days of pain medication remaining and has been unable to get the medication refilled. States he is "tired of taking pills" and is unwilling to continue his other medications if he is unable to get a refill of pain medication. ? ?Binnie Kand, RN  ?

## 2022-05-23 ENCOUNTER — Telehealth: Payer: Self-pay

## 2022-05-23 ENCOUNTER — Ambulatory Visit: Payer: Medicare Other | Admitting: Infectious Diseases

## 2022-05-23 DIAGNOSIS — M79672 Pain in left foot: Secondary | ICD-10-CM | POA: Diagnosis not present

## 2022-05-23 DIAGNOSIS — M79671 Pain in right foot: Secondary | ICD-10-CM | POA: Diagnosis not present

## 2022-05-23 DIAGNOSIS — M79661 Pain in right lower leg: Secondary | ICD-10-CM | POA: Diagnosis not present

## 2022-05-23 DIAGNOSIS — Z79891 Long term (current) use of opiate analgesic: Secondary | ICD-10-CM | POA: Diagnosis not present

## 2022-05-23 DIAGNOSIS — G894 Chronic pain syndrome: Secondary | ICD-10-CM | POA: Diagnosis not present

## 2022-05-23 DIAGNOSIS — G8929 Other chronic pain: Secondary | ICD-10-CM | POA: Diagnosis not present

## 2022-05-23 DIAGNOSIS — M79662 Pain in left lower leg: Secondary | ICD-10-CM | POA: Diagnosis not present

## 2022-05-23 DIAGNOSIS — M5432 Sciatica, left side: Secondary | ICD-10-CM | POA: Diagnosis not present

## 2022-05-23 DIAGNOSIS — M545 Low back pain, unspecified: Secondary | ICD-10-CM | POA: Diagnosis not present

## 2022-05-23 NOTE — Telephone Encounter (Signed)
Patient called to inform Dr.Ravishankarr that he will not be able to come to his appointment on 05/25/2022 due to car trouble. He will call to reschedule.   [place], CMA

## 2022-05-25 ENCOUNTER — Ambulatory Visit: Payer: Medicare Other | Attending: Infectious Diseases | Admitting: Infectious Diseases

## 2022-05-25 ENCOUNTER — Ambulatory Visit: Payer: Medicare Other | Admitting: Infectious Diseases

## 2022-05-25 DIAGNOSIS — Z79899 Other long term (current) drug therapy: Secondary | ICD-10-CM | POA: Diagnosis not present

## 2022-05-25 DIAGNOSIS — B20 Human immunodeficiency virus [HIV] disease: Secondary | ICD-10-CM | POA: Insufficient documentation

## 2022-05-25 DIAGNOSIS — B1920 Unspecified viral hepatitis C without hepatic coma: Secondary | ICD-10-CM | POA: Diagnosis not present

## 2022-05-25 DIAGNOSIS — I10 Essential (primary) hypertension: Secondary | ICD-10-CM | POA: Insufficient documentation

## 2022-05-25 DIAGNOSIS — Z7951 Long term (current) use of inhaled steroids: Secondary | ICD-10-CM | POA: Insufficient documentation

## 2022-05-25 DIAGNOSIS — E785 Hyperlipidemia, unspecified: Secondary | ICD-10-CM | POA: Diagnosis not present

## 2022-05-25 DIAGNOSIS — J449 Chronic obstructive pulmonary disease, unspecified: Secondary | ICD-10-CM | POA: Insufficient documentation

## 2022-05-25 DIAGNOSIS — M549 Dorsalgia, unspecified: Secondary | ICD-10-CM | POA: Diagnosis not present

## 2022-05-25 DIAGNOSIS — F17210 Nicotine dependence, cigarettes, uncomplicated: Secondary | ICD-10-CM | POA: Diagnosis not present

## 2022-05-25 MED ORDER — TRIUMEQ 600-50-300 MG PO TABS: 1.0000 | ORAL_TABLET | Freq: Every day | ORAL | 6 refills | Status: DC

## 2022-05-25 NOTE — Progress Notes (Signed)
NAME: Albert Wong  DOB: Oct 08, 1956  MRN: [id number]  Date/Time: 05/25/2022 9:29 AM   The purpose of this virtual visit is to provide medical care while limiting exposure to the novel coronavirus (COVID19) for both patient and office staff.   Consent was obtained for phone visit:  Yes.   Answered questions that patient had about telehealth interaction:  Yes.   I discussed the limitations, risks, security and privacy concerns of performing an evaluation and management service by telephone. I also discussed with the patient that there may be a patient responsible charge related to this service. The patient expressed understanding and agreed to proceed.   Patient Location: Home Provider Location: office  Albert Wong is a 66 y.o. with a history of HIV disease diagnosed in early 32s , Treated hepatitis C , COPD, current smoker, on MS contin for back pain HTN, hyperlipidemia  Last seen in NOV2022 Since then he has a new pain management clinic in [place] On triumeq 100% last VL 76, [place] he does not have a working car Has been losing weight because of stress No fever, chills, no night sweats, no pain and, no diarrhea, has constipation Has sob, still smoking  Copd, using inhalers- combivent, breztri  Following is taken from the notes  HIV diagnosed  early 12s., says it was a routine blood test, - was in care at [facility] for many years and then went to see Dr.Fitzgerald in 2014-2019, then [facility]  Nadir Cd4 DK- on  08/14/1991 was 440 VL  OI -None  HAARt history First regimen  Azt based Atripla Now on Triumeq 100% adherent to HAART- says last Vl < 20  Acquired thru sex with men Not sexually active in 15 years he says Genotype unknown ? Past Medical History:  Diagnosis Date   Anxiety    Chronic nausea    Degenerative disk disease    HIV (human immunodeficiency virus infection) ([place])    HIV (human immunodeficiency virus infection) ([place])    Insomnia    Muscle spasm    Rotator cuff tear      Past Surgical History:  Procedure Laterality Date   dental procedure N/A    LEFT HEART CATH AND CORONARY ANGIOGRAPHY N/A 09/16/2021   Procedure: LEFT HEART CATH AND CORONARY ANGIOGRAPHY;  Surgeon: Wellington Hampshire, MD;  Location: [place] [facility];  Service: Cardiovascular;  Laterality: N/A;    Social History   Socioeconomic History   Marital status: Single    Spouse name: Not on file   Number of children: Not on file   Years of education: Not on file   Highest education level: Not on file  Occupational History   Not on file  Tobacco Use   Smoking status: Some Days    Packs/day: 1.00    Years: 39.00    Pack years: 39.00    Types: Cigarettes   Smokeless tobacco: Never   Tobacco comments:    Smokes about 6 cigarettes a week.  Vaping Use   Vaping Use: Former   Devices: tried when they first came out for about 2 weeks  Substance and Sexual Activity   Alcohol use: Not Currently   Drug use: No   Sexual activity: Not on file  Other Topics Concern   Not on file  Social History Narrative   Not on file   Social Determinants of Health   Financial Resource Strain: Not on file  Food Insecurity: Not on file  Transportation Needs: Not  on file  Physical Activity: Not on file  Stress: Not on file  Social Connections: Not on file  Intimate Partner Violence: Not on file    Family History  Problem Relation Age of Onset   Alzheimer's disease Mother    Breast cancer Mother    Heart disease Father   Mother breast cancer Allergies  Allergen Reactions   Dovato [Dolutegravir-Lamivudine] Other (See Comments)    Severe Flu like symptoms Sweating  Shortness of breath Body aches-- Doubt this is Dovato allergy as he is tolerating truimeq which is dovato without abacavir   Entresto [Sacubitril-Valsartan] Photosensitivity    Dizziness   Metoprolol Succinate [Metoprolol] Photosensitivity    Patients reported change in vision. D/c by pt pcp   ? Current Outpatient Medications   Medication Sig Dispense Refill   aspirin EC 81 MG EC tablet Take 1 tablet (81 mg total) by mouth daily. Swallow whole. 30 tablet 2   B-D 3CC LUER-LOK SYR 21GX1" 21G X 1" 3 ML MISC USE AS DIRECTED WITH TESTOSTERONE 24 each 6   baclofen (LIORESAL) 10 MG tablet TAKE 1 TABLET BY MOUTH EVERY DAY 30 tablet 11   BREZTRI AEROSPHERE 160-9-4.8 MCG/ACT AERO Inhale 2 puffs into the lungs 2 (two) times daily.     budesonide-formoterol (SYMBICORT) 80-4.5 MCG/ACT inhaler Inhale 2 puffs into the lungs 2 (two) times daily as needed.     cromolyn (OPTICROM) 4 % ophthalmic solution SMARTSIG:1-2 Drop(s) In Eye(s) 4-6 Times Daily     dapagliflozin propanediol (FARXIGA) 10 MG TABS tablet TAKE 1 TABLET BY MOUTH EVERY EVENING 90 tablet 3   diazepam (VALIUM) 10 MG tablet Take 10 mg by mouth every 6 (six) hours as needed for anxiety.     dicyclomine (BENTYL) 20 MG tablet Take 20 mg by mouth in the morning and at bedtime.     Ipratropium-Albuterol (COMBIVENT) 20-100 MCG/ACT AERS respimat Inhale into the lungs as directed.     ketoconazole (NIZORAL) 2 % cream Apply 1 application topically daily as needed for irritation.     losartan (COZAAR) 25 MG tablet Take 1 tablet (25 mg total) by mouth daily. 90 tablet 3   Melatonin 5 MG CAPS Take 2 capsules by mouth at bedtime.     morphine (MS CONTIN) 30 MG 12 hr tablet Take 1 tablet (30 mg total) by mouth 2 (two) times daily. 60 tablet 0   rosuvastatin (CRESTOR) 20 MG tablet SMARTSIG:1 Tablet(s) By Mouth Every Evening     tamsulosin (FLOMAX) 0.4 MG CAPS capsule TAKE 1 CAPSULE BY MOUTH ONCE DAILY 30 capsule 11   TRIUMEQ 600-50-300 MG tablet Take 1 tablet by mouth daily. 30 tablet 6   VITAMIN D PO Take 1,000 Units by mouth daily.     lubiprostone (AMITIZA) 24 MCG capsule Take 24 mcg by mouth 2 (two) times daily. (Patient not taking: Reported on 05/25/2022)     No current facility-administered medications for this visit.    REVIEW OF SYSTEMS:  As above Objective:    none  Impression/Recommendation ? ?HIV disease- well controlled- 100% adherent to triumeq Last Vl 60 and cd4 is 526 Once he has    COPD- on inhalers, not on  home oxygen followed by [facility]- continues to smoke because of stress- had quit before   HTN on amlodipine-   Treated Hepatits C- HCV RNA not detected   LBA- on MS contin ? ________________________________________________ Follow up for labs and appt once he has the car   Total time spent on  this telephone visit was 23 min

## 2022-05-26 ENCOUNTER — Other Ambulatory Visit: Payer: Self-pay

## 2022-05-26 DIAGNOSIS — B20 Human immunodeficiency virus [HIV] disease: Secondary | ICD-10-CM

## 2022-05-30 ENCOUNTER — Other Ambulatory Visit
Admission: RE | Admit: 2022-05-30 | Discharge: 2022-05-30 | Disposition: A | Discharge status: Home / Self Care | Payer: Medicare Other | Attending: Infectious Diseases | Admitting: Infectious Diseases

## 2022-05-30 ENCOUNTER — Other Ambulatory Visit
Admission: RE | Admit: 2022-05-30 | Discharge: 2022-05-30 | Disposition: A | Discharge status: Home / Self Care | Payer: Medicare Other | Source: Home / Self Care | Attending: Internal Medicine | Admitting: Internal Medicine

## 2022-05-30 DIAGNOSIS — B20 Human immunodeficiency virus [HIV] disease: Secondary | ICD-10-CM | POA: Insufficient documentation

## 2022-05-30 DIAGNOSIS — E785 Hyperlipidemia, unspecified: Secondary | ICD-10-CM | POA: Insufficient documentation

## 2022-05-30 DIAGNOSIS — E559 Vitamin D deficiency, unspecified: Secondary | ICD-10-CM | POA: Insufficient documentation

## 2022-05-30 DIAGNOSIS — Z21 Asymptomatic human immunodeficiency virus [HIV] infection status: Secondary | ICD-10-CM | POA: Insufficient documentation

## 2022-05-30 LAB — COMPREHENSIVE METABOLIC PANEL
ALT: 11 U/L (ref 0–44)
AST: 29 U/L (ref 15–41)
Albumin: 4.7 g/dL (ref 3.5–5.0)
Alkaline Phosphatase: 38 U/L (ref 38–126)
Anion gap: 6 (ref 5–15)
BUN: 7 mg/dL — ABNORMAL LOW (ref 8–23)
CO2: 30 mmol/L (ref 22–32)
Calcium: 9.4 mg/dL (ref 8.9–10.3)
Chloride: 104 mmol/L (ref 98–111)
Creatinine, Ser: 1 mg/dL (ref 0.61–1.24)
GFR, Estimated: >60 mL/min (ref >60)
Glucose, Bld: 95 mg/dL (ref 70–99)
Potassium: 4.4 mmol/L (ref 3.5–5.1)
Sodium: 140 mmol/L (ref 135–145)
Total Bilirubin: 0.6 mg/dL (ref 0.3–1.2)
Total Protein: 7.6 g/dL (ref 6.5–8.1)

## 2022-05-30 LAB — HEPATIC FUNCTION PANEL
ALT: 11 U/L (ref 0–44)
AST: 29 U/L (ref 15–41)
Albumin: 4.5 g/dL (ref 3.5–5.0)
Alkaline Phosphatase: 37 U/L — ABNORMAL LOW (ref 38–126)
Bilirubin, Direct: 0.1 mg/dL (ref 0.0–0.2)
Indirect Bilirubin: 0.4 mg/dL (ref 0.3–0.9)
Total Bilirubin: 0.5 mg/dL (ref 0.3–1.2)
Total Protein: 7.6 g/dL (ref 6.5–8.1)

## 2022-05-30 LAB — CK: Total CK: 164 U/L (ref 49–397)

## 2022-05-31 LAB — HIV 1/2 AB DIFFERENTIATION
HIV 1 Ab: REACTIVE
HIV 2 Ab: NONREACTIVE

## 2022-05-31 LAB — HIV ANTIBODY (ROUTINE TESTING W REFLEX): HIV Screen 4th Generation wRfx: REACTIVE

## 2022-06-01 ENCOUNTER — Telehealth: Payer: Self-pay

## 2022-06-01 NOTE — Telephone Encounter (Signed)
Patient left voicemail requesting information for virtual psychiatry. Attempted to call patient back with number for behavorial health. Not able to reach him at this time. Left voicemail stating I would send mychart message with information.  Leatrice Jewels, RMA

## 2022-06-02 DIAGNOSIS — E785 Hyperlipidemia, unspecified: Secondary | ICD-10-CM | POA: Diagnosis not present

## 2022-06-02 DIAGNOSIS — E559 Vitamin D deficiency, unspecified: Secondary | ICD-10-CM | POA: Diagnosis not present

## 2022-06-02 DIAGNOSIS — J301 Allergic rhinitis due to pollen: Secondary | ICD-10-CM | POA: Diagnosis not present

## 2022-06-02 DIAGNOSIS — G8929 Other chronic pain: Secondary | ICD-10-CM | POA: Diagnosis not present

## 2022-06-02 DIAGNOSIS — R110 Nausea: Secondary | ICD-10-CM | POA: Diagnosis not present

## 2022-06-02 DIAGNOSIS — I10 Essential (primary) hypertension: Secondary | ICD-10-CM | POA: Diagnosis not present

## 2022-06-02 LAB — HELPER T-LYMPH-CD4 (ARMC ONLY)
% CD 4 Pos. Lymph.: 40.4 % (ref 30.8–58.5)
Absolute CD 4 Helper: 566 /uL (ref 359–1519)
Basophils Absolute: 0 10*3/uL (ref 0.0–0.2)
Basos: 1 %
EOS (ABSOLUTE): 0.1 10*3/uL (ref 0.0–0.4)
Eos: 2 %
Hematocrit: 50.5 % (ref 37.5–51.0)
Hemoglobin: 17.1 g/dL (ref 13.0–17.7)
Immature Grans (Abs): 0 10*3/uL (ref 0.0–0.1)
Immature Granulocytes: 0 %
Lymphocytes Absolute: 1.4 10*3/uL (ref 0.7–3.1)
Lymphs: 25 %
MCH: 36 pg — ABNORMAL HIGH (ref 26.6–33.0)
MCHC: 33.9 g/dL (ref 31.5–35.7)
MCV: 106 fL — ABNORMAL HIGH (ref 79–97)
Monocytes Absolute: 0.3 10*3/uL (ref 0.1–0.9)
Monocytes: 5 %
Neutrophils Absolute: 3.8 10*3/uL (ref 1.4–7.0)
Neutrophils: 67 %
Platelets: 235 10*3/uL (ref 150–450)
RBC: 4.75 x10E6/uL (ref 4.14–5.80)
RDW: 11.2 % — ABNORMAL LOW (ref 11.6–15.4)
WBC: 5.7 10*3/uL (ref 3.4–10.8)

## 2022-06-02 LAB — VITAMIN D 25 HYDROXY (VIT D DEFICIENCY, FRACTURES)

## 2022-06-05 ENCOUNTER — Telehealth: Payer: Self-pay | Admitting: Acute Care

## 2022-06-05 NOTE — Telephone Encounter (Signed)
Pt declined LDCT this yr.  Notes since MI, doctors have worn him out without improvement.  Declines number of program as well.

## 2022-06-13 ENCOUNTER — Encounter: Payer: Medicare Other | Admitting: Dermatology

## 2022-06-20 DIAGNOSIS — M79661 Pain in right lower leg: Secondary | ICD-10-CM | POA: Diagnosis not present

## 2022-06-20 DIAGNOSIS — M79671 Pain in right foot: Secondary | ICD-10-CM | POA: Diagnosis not present

## 2022-06-20 DIAGNOSIS — M5432 Sciatica, left side: Secondary | ICD-10-CM | POA: Diagnosis not present

## 2022-06-20 DIAGNOSIS — M79662 Pain in left lower leg: Secondary | ICD-10-CM | POA: Diagnosis not present

## 2022-06-20 DIAGNOSIS — M545 Low back pain, unspecified: Secondary | ICD-10-CM | POA: Diagnosis not present

## 2022-06-20 DIAGNOSIS — G894 Chronic pain syndrome: Secondary | ICD-10-CM | POA: Diagnosis not present

## 2022-06-20 DIAGNOSIS — Z79891 Long term (current) use of opiate analgesic: Secondary | ICD-10-CM | POA: Diagnosis not present

## 2022-06-20 DIAGNOSIS — M79672 Pain in left foot: Secondary | ICD-10-CM | POA: Diagnosis not present

## 2022-06-20 DIAGNOSIS — G8929 Other chronic pain: Secondary | ICD-10-CM | POA: Diagnosis not present

## 2022-06-30 ENCOUNTER — Telehealth: Payer: Self-pay

## 2022-06-30 NOTE — Telephone Encounter (Signed)
Patient called wanting to ask Dr.Ravishankar if it is okay for him to take Vitamin K. Patient has developed purpura spots on hands and patient read online that Vitamin K would help thicken skin.    [place], CMA

## 2022-06-30 NOTE — Telephone Encounter (Signed)
Left voicemail informing patient to ask/be evaluated by PCP regarding purpura spots.

## 2022-07-04 IMAGING — US US SCROTUM W/ DOPPLER COMPLETE
1 series · 13 of 25 positions shown · non-contrast
Comparison: CT 08/03/2007

CLINICAL DATA: Palpable soft tissue nodule in the left

EXAM:
SCROTAL ULTRASOUND
DOPPLER ULTRASOUND OF THE TESTICLES
TECHNIQUE: Complete ultrasound examination of the testicles, epididymis, and
other scrotal structures was performed. Color and spectral Doppler
ultrasound were also utilized to evaluate blood flow to the
testicles.

[Series 1: us scrotum w/ doppler complete · 0.07mm/px · 40 acquisitions, 13 frames shown]
[im 1/40]
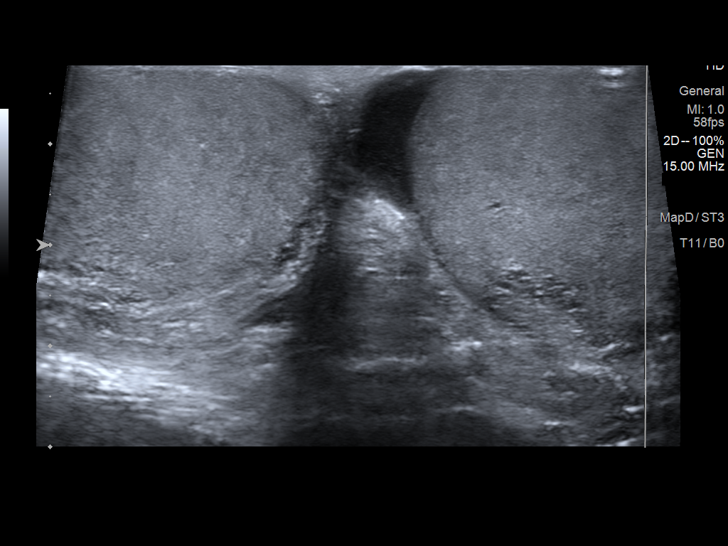
[im 4/40]
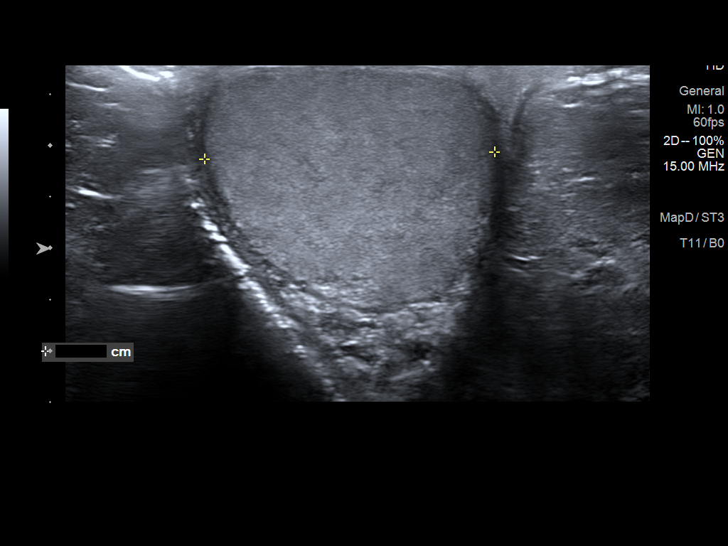
[im 7/40]
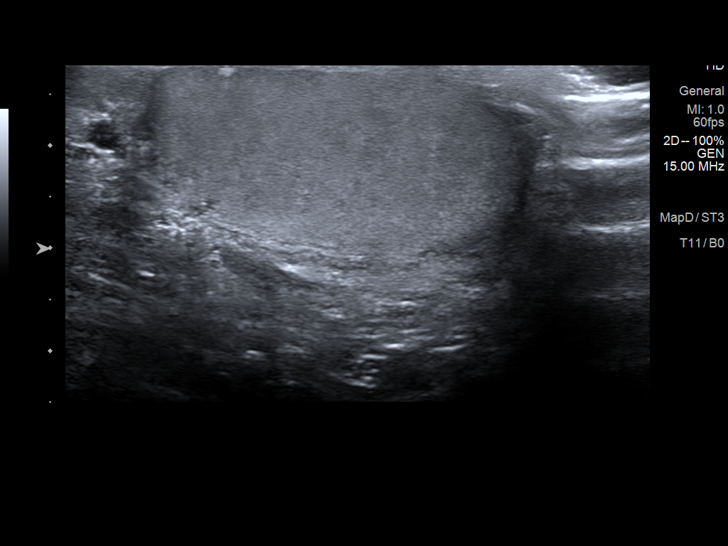
[im 10/40]
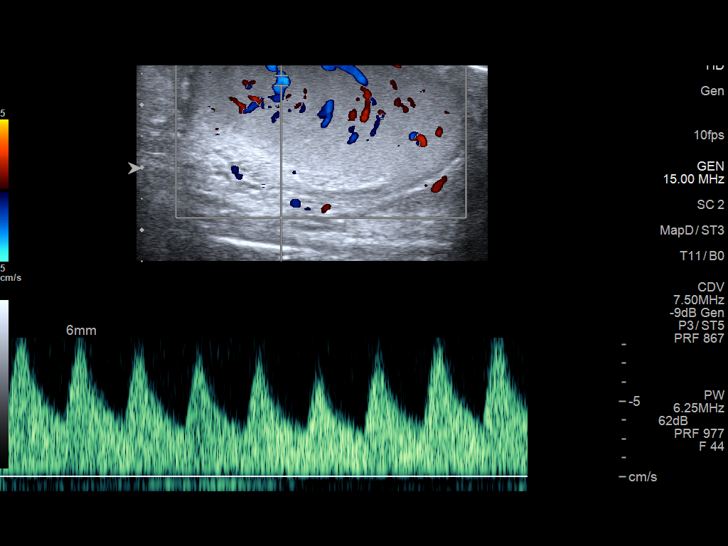
[im 14/40]
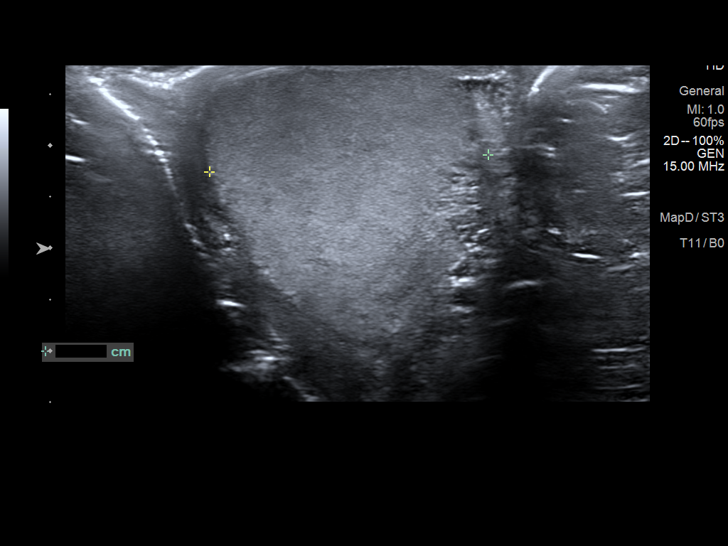
[im 17/40]
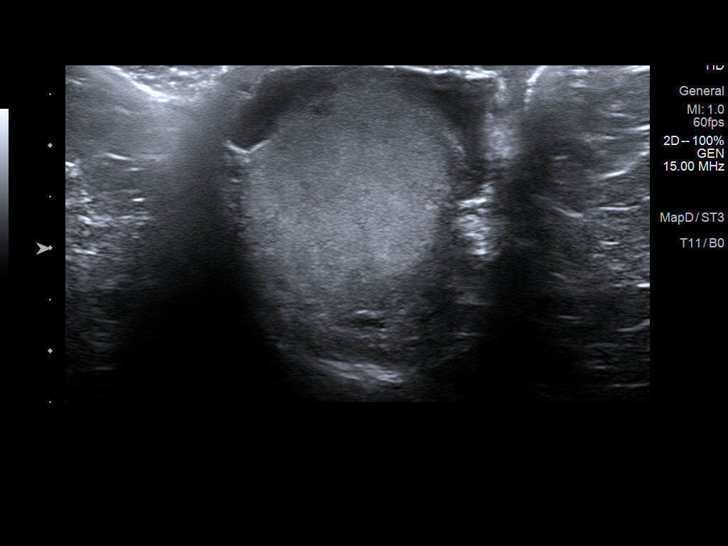
[im 20/40]
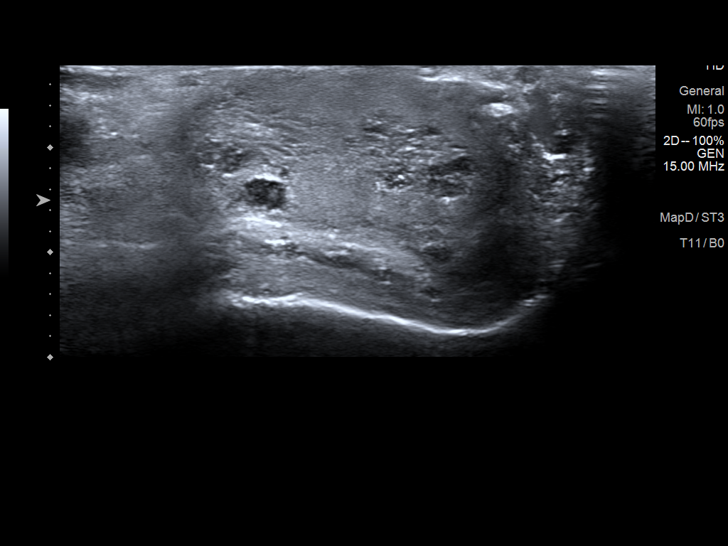
[im 23/40]
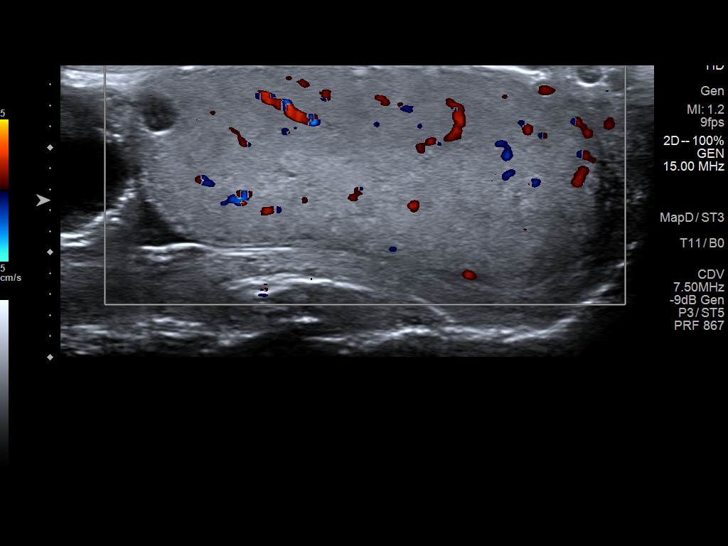
[im 27/40]
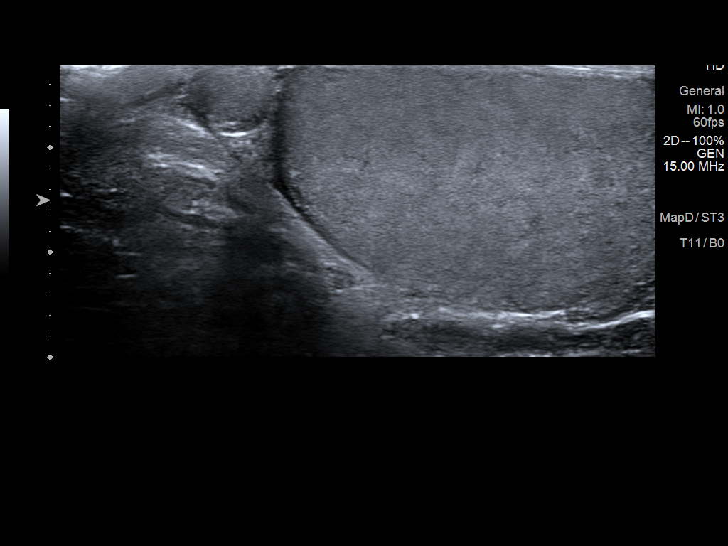
[im 30/40]
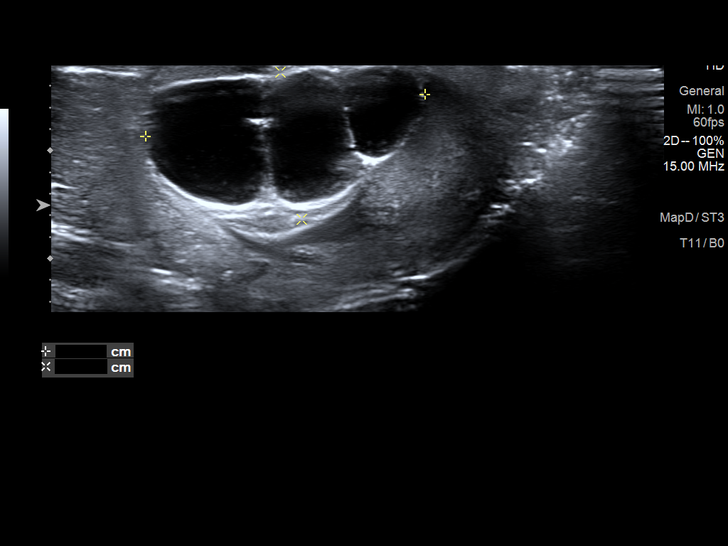
[im 33/40]
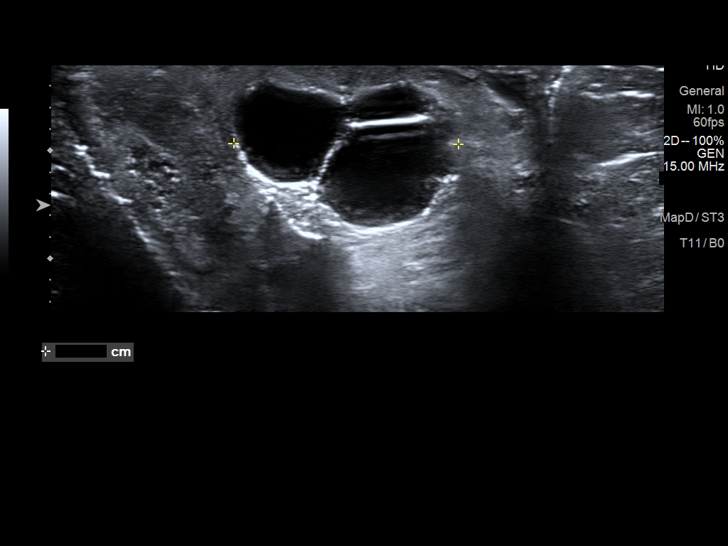
[im 36/40]
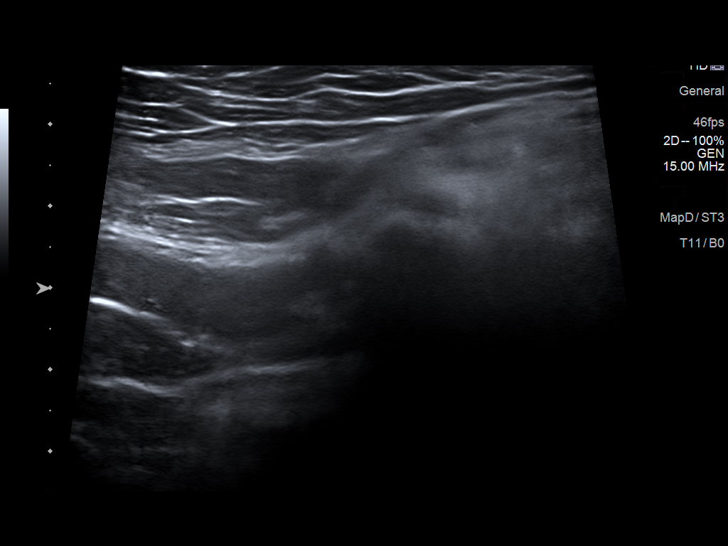
[im 40/40]
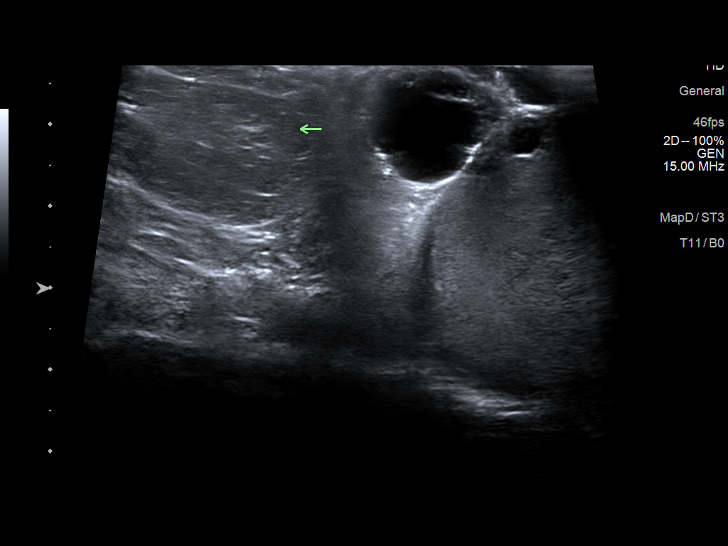

[13 of 25 positions shown; findings below may reference images not displayed]

FINDINGS: Right testicle

Measurements: 4.5 x 2.3 x 2.8 cm. Uniform parenchyma. No mass or
microlithiasis.

Left testicle

Measurements: 4.6 x 1.8 x 2.7 cm. Simple appearing anechoic intra
testicular cystic focus measuring to 6 mm. Additional features of
surrounding rete tubular ectasia is noted as well. No other
concerning focal abnormality within the testicle.

Right epididymis:  Normal in size and appearance.

Left epididymis: Large from multi septate cyst comprising much of
the epididymal head measuring 2.6 x 1.4 x 2.1 cm. Thin internal
septations without solid components.

Hydrocele:  None visualized.

Varicocele:  None visualized.

Pulsed Doppler interrogation of both testes demonstrates normal low
resistance arterial and venous waveforms bilaterally.

Other: Bilateral fat containing inguinal hernias, left greater than
right. Partially reducible.
IMPRESSION: 1. Small 6 mm anechoic intratesticular cyst associated with the
region of rete tubular ectasia. Favor benign testicular cyst.
2. Additional minimally complex, 2.6 cm cystic focus occupying much
of the epididymal head with thin internal septations. Could reflect
a complex epididymal head cyst or spermatocele.
3. No concerning testicular mass or evidence of torsion.
4. Partially reducible bilateral fat containing inguinal hernias. No
visible protrusion of bowel signature.

## 2022-07-11 ENCOUNTER — Other Ambulatory Visit: Payer: Self-pay | Admitting: Medical

## 2022-07-11 DIAGNOSIS — R634 Abnormal weight loss: Secondary | ICD-10-CM | POA: Diagnosis not present

## 2022-07-11 DIAGNOSIS — I10 Essential (primary) hypertension: Secondary | ICD-10-CM | POA: Diagnosis not present

## 2022-07-11 DIAGNOSIS — E785 Hyperlipidemia, unspecified: Secondary | ICD-10-CM | POA: Diagnosis not present

## 2022-07-11 DIAGNOSIS — J301 Allergic rhinitis due to pollen: Secondary | ICD-10-CM | POA: Diagnosis not present

## 2022-07-11 DIAGNOSIS — J449 Chronic obstructive pulmonary disease, unspecified: Secondary | ICD-10-CM | POA: Diagnosis not present

## 2022-07-11 DIAGNOSIS — M25562 Pain in left knee: Secondary | ICD-10-CM | POA: Diagnosis not present

## 2022-07-11 DIAGNOSIS — E559 Vitamin D deficiency, unspecified: Secondary | ICD-10-CM | POA: Diagnosis not present

## 2022-07-18 DIAGNOSIS — M79661 Pain in right lower leg: Secondary | ICD-10-CM | POA: Diagnosis not present

## 2022-07-18 DIAGNOSIS — Z79891 Long term (current) use of opiate analgesic: Secondary | ICD-10-CM | POA: Diagnosis not present

## 2022-07-18 DIAGNOSIS — M79672 Pain in left foot: Secondary | ICD-10-CM | POA: Diagnosis not present

## 2022-07-18 DIAGNOSIS — M79662 Pain in left lower leg: Secondary | ICD-10-CM | POA: Diagnosis not present

## 2022-07-18 DIAGNOSIS — M79671 Pain in right foot: Secondary | ICD-10-CM | POA: Diagnosis not present

## 2022-07-18 DIAGNOSIS — M5432 Sciatica, left side: Secondary | ICD-10-CM | POA: Diagnosis not present

## 2022-07-18 DIAGNOSIS — G8929 Other chronic pain: Secondary | ICD-10-CM | POA: Diagnosis not present

## 2022-07-18 DIAGNOSIS — G894 Chronic pain syndrome: Secondary | ICD-10-CM | POA: Diagnosis not present

## 2022-07-18 DIAGNOSIS — M545 Low back pain, unspecified: Secondary | ICD-10-CM | POA: Diagnosis not present

## 2022-07-24 DIAGNOSIS — I2510 Atherosclerotic heart disease of native coronary artery without angina pectoris: Secondary | ICD-10-CM | POA: Diagnosis not present

## 2022-07-24 DIAGNOSIS — I110 Hypertensive heart disease with heart failure: Secondary | ICD-10-CM | POA: Diagnosis not present

## 2022-07-24 DIAGNOSIS — I5022 Chronic systolic (congestive) heart failure: Secondary | ICD-10-CM | POA: Diagnosis not present

## 2022-07-25 ENCOUNTER — Encounter: Payer: Self-pay | Admitting: Infectious Diseases

## 2022-07-25 ENCOUNTER — Other Ambulatory Visit
Admission: RE | Admit: 2022-07-25 | Discharge: 2022-07-25 | Disposition: A | Discharge status: Home / Self Care | Payer: Medicare Other | Attending: Infectious Diseases | Admitting: Infectious Diseases

## 2022-07-25 ENCOUNTER — Ambulatory Visit: Payer: Medicare Other | Attending: Infectious Diseases | Admitting: Infectious Diseases

## 2022-07-25 VITALS — BP 105/64 | HR 80 | Temp 97.9°F | Ht 70.0 in | Wt 146.0 lb

## 2022-07-25 DIAGNOSIS — I10 Essential (primary) hypertension: Secondary | ICD-10-CM | POA: Diagnosis not present

## 2022-07-25 DIAGNOSIS — I252 Old myocardial infarction: Secondary | ICD-10-CM | POA: Diagnosis not present

## 2022-07-25 DIAGNOSIS — Z85828 Personal history of other malignant neoplasm of skin: Secondary | ICD-10-CM | POA: Diagnosis not present

## 2022-07-25 DIAGNOSIS — E785 Hyperlipidemia, unspecified: Secondary | ICD-10-CM

## 2022-07-25 DIAGNOSIS — B20 Human immunodeficiency virus [HIV] disease: Secondary | ICD-10-CM | POA: Insufficient documentation

## 2022-07-25 DIAGNOSIS — Z79899 Other long term (current) drug therapy: Secondary | ICD-10-CM | POA: Insufficient documentation

## 2022-07-25 DIAGNOSIS — J449 Chronic obstructive pulmonary disease, unspecified: Secondary | ICD-10-CM | POA: Diagnosis not present

## 2022-07-25 DIAGNOSIS — Z79624 Long term (current) use of inhibitors of nucleotide synthesis: Secondary | ICD-10-CM | POA: Insufficient documentation

## 2022-07-25 DIAGNOSIS — F17210 Nicotine dependence, cigarettes, uncomplicated: Secondary | ICD-10-CM

## 2022-07-25 LAB — COMPREHENSIVE METABOLIC PANEL
ALT: 8 U/L (ref 0–44)
AST: 26 U/L (ref 15–41)
Albumin: 4.2 g/dL (ref 3.5–5.0)
Alkaline Phosphatase: 35 U/L — ABNORMAL LOW (ref 38–126)
Anion gap: 8 (ref 5–15)
BUN: 7 mg/dL — ABNORMAL LOW (ref 8–23)
CO2: 28 mmol/L (ref 22–32)
Calcium: 8.9 mg/dL (ref 8.9–10.3)
Chloride: 104 mmol/L (ref 98–111)
Creatinine, Ser: 0.96 mg/dL (ref 0.61–1.24)
GFR, Estimated: >60 mL/min (ref >60)
Glucose, Bld: 97 mg/dL (ref 70–99)
Potassium: 3.7 mmol/L (ref 3.5–5.1)
Sodium: 140 mmol/L (ref 135–145)
Total Bilirubin: 0.5 mg/dL (ref 0.3–1.2)
Total Protein: 7.2 g/dL (ref 6.5–8.1)

## 2022-07-25 LAB — URINALYSIS, ROUTINE W REFLEX MICROSCOPIC
Bacteria, UA: NONE SEEN
Bilirubin Urine: NEGATIVE
Glucose, UA: >=500 mg/dL — AB
Hgb urine dipstick: NEGATIVE
Ketones, ur: NEGATIVE mg/dL
Leukocytes,Ua: NEGATIVE
Nitrite: NEGATIVE
Protein, ur: NEGATIVE mg/dL
Specific Gravity, Urine: 1.024 (ref 1.005–1.030)
Squamous Epithelial / HPF: NONE SEEN (ref 0–5)
pH: 5 (ref 5.0–8.0)

## 2022-07-25 NOTE — Patient Instructions (Signed)
You are here for follow up of HIV- today we will do labs- will follow up 53month

## 2022-07-25 NOTE — Progress Notes (Signed)
NAME: Albert Wong  DOB: June 12, 1956  MRN: [id number]  Date/Time: 07/25/2022 10:13 AM   Subjective:  Pt is here for follow up for HIV  Albert Wong is a 66 y.o. with a history of HIV disease diagnosed in early 63s , Treated hepatitis C , COPD, current smoker, on MS contin for back pain ( pain management by Dr.Crisp), HTN, hyperlipidemia  Pt is doing fine Has a new pain management doc - continues MS contin Says he used to be on marinol and his weight was in the 160s.  Has lower back pain- thinks it is the kidney, but no dysuria, or other symptoms He is not sure of the names of the meds he is taking His PCP Dr.Tejan Con Memos is not within [facility] Medication reconciliation is an issue Last Vl < 20 and cd4 around 600     Visit from 11/22/21 hospitalized between 09/16/21-09/17/21 with sob on extertion for 3 days which he attributed to switching HIV meds- He was on Triumeq which is abacavir, epivir and dolutegravir to Dovato which is epivir + dolutegravir without abacavir In the ED he had a n increase in troponin to 531 , BNP 48, BP 115/71, CXR neg Had CT angio Mild to moderate nonobstructive coronary artery disease. 2.  Moderately reduced LV systolic function with wall motion abnormality suggestive of stress-induced cardiomyopathy.  Mildly elevated left ventricular end-diastolic pressure. Medical management for non obstructive CAD was recommended. EF was 40-45% Amlodipine was switched to metoprolol [name] was added . He is doing better until he smoked a cigarette and became very sob  Says he almost went blind with a medicine which his PCP asked him to stop- He does not know what the pill was and what he was taking it .    06/01/21  HIV diagnosed  early 63s., says it was a routine blood test, - was in care at [facility] for many years and then went to see Dr.Fitzgerald in 2014-2019, then [facility]  Nadir Cd4 DK- on  08/14/1991 was 440 VL  OI -None  HAARt history First regimen  Azt based Atripla Now on  Triumeq 100% adherent to HAART- says last Vl < 20  Acquired thru sex with men Not sexually active in 20 years he says Genotype unknown ? Past Medical History:  Diagnosis Date   Anxiety    Chronic nausea    Degenerative disk disease    HIV (human immunodeficiency virus infection) ([place])    HIV (human immunodeficiency virus infection) ([place])    Insomnia    Muscle spasm    Rotator cuff tear     Past Surgical History:  Procedure Laterality Date   dental procedure N/A    LEFT HEART CATH AND CORONARY ANGIOGRAPHY N/A 09/16/2021   Procedure: LEFT HEART CATH AND CORONARY ANGIOGRAPHY;  Surgeon: Wellington Hampshire, MD;  Location: [facility] [place] [facility];  Service: Cardiovascular;  Laterality: N/A;    Social History   Socioeconomic History   Marital status: Single    Spouse name: Not on file   Number of children: Not on file   Years of education: Not on file   Highest education level: Not on file  Occupational History   Not on file  Tobacco Use   Smoking status: Some Days    Packs/day: 1.00    Years: 39.00    Total pack years: 39.00    Types: Cigarettes   Smokeless tobacco: Never   Tobacco comments:    Smokes about 6 cigarettes a  week.  Vaping Use   Vaping Use: Former   Devices: tried when they first came out for about 2 weeks  Substance and Sexual Activity   Alcohol use: Not Currently   Drug use: No   Sexual activity: Not on file  Other Topics Concern   Not on file  Social History Narrative   Not on file   Social Determinants of Health   Financial Resource Strain: Not on file  Food Insecurity: Not on file  Transportation Needs: Not on file  Physical Activity: Not on file  Stress: Not on file  Social Connections: Not on file  Intimate Partner Violence: Not on file    Family History  Problem Relation Age of Onset   Alzheimer's disease Mother    Breast cancer Mother    Heart disease Father   Mother breast cancer Allergies  Allergen Reactions   Dovato  [Dolutegravir-Lamivudine] Other (See Comments)    Severe Flu like symptoms Sweating  Shortness of breath Body aches-- Doubt this is Dovato allergy as he is tolerating truimeq which is dovato without abacavir   Entresto [Sacubitril-Valsartan] Photosensitivity    Dizziness   Metoprolol Succinate [Metoprolol] Photosensitivity    Patients reported change in vision. D/c by pt pcp   ? Current Outpatient Medications  Medication Sig Dispense Refill   aspirin EC 81 MG EC tablet Take 1 tablet (81 mg total) by mouth daily. Swallow whole. 30 tablet 2   B-D 3CC LUER-LOK SYR 21GX1" 21G X 1" 3 ML MISC USE AS DIRECTED WITH TESTOSTERONE 24 each 6   baclofen (LIORESAL) 10 MG tablet TAKE 1 TABLET BY MOUTH EVERY DAY 30 tablet 11   BREZTRI AEROSPHERE 160-9-4.8 MCG/ACT AERO Inhale 2 puffs into the lungs 2 (two) times daily.     budesonide-formoterol (SYMBICORT) 80-4.5 MCG/ACT inhaler Inhale 2 puffs into the lungs 2 (two) times daily as needed.     cromolyn (OPTICROM) 4 % ophthalmic solution SMARTSIG:1-2 Drop(s) In Eye(s) 4-6 Times Daily     dapagliflozin propanediol (FARXIGA) 10 MG TABS tablet TAKE 1 TABLET BY MOUTH EVERY EVENING 90 tablet 3   diazepam (VALIUM) 10 MG tablet Take 10 mg by mouth every 6 (six) hours as needed for anxiety.     dicyclomine (BENTYL) 20 MG tablet Take 20 mg by mouth in the morning and at bedtime.     Ipratropium-Albuterol (COMBIVENT) 20-100 MCG/ACT AERS respimat Inhale into the lungs as directed.     ketoconazole (NIZORAL) 2 % cream Apply 1 application topically daily as needed for irritation.     losartan (COZAAR) 25 MG tablet TAKE ONE TABLET BY MOUTH DAILY 90 tablet 3   lubiprostone (AMITIZA) 24 MCG capsule Take 24 mcg by mouth 2 (two) times daily.     Melatonin 5 MG CAPS Take 2 capsules by mouth at bedtime.     morphine (MS CONTIN) 30 MG 12 hr tablet Take 1 tablet (30 mg total) by mouth 2 (two) times daily. 60 tablet 0   rosuvastatin (CRESTOR) 20 MG tablet SMARTSIG:1 Tablet(s) By  Mouth Every Evening     tamsulosin (FLOMAX) 0.4 MG CAPS capsule TAKE 1 CAPSULE BY MOUTH ONCE DAILY 30 capsule 11   TRIUMEQ 600-50-300 MG tablet Take 1 tablet by mouth daily. 30 tablet 6   VITAMIN D PO Take 1,000 Units by mouth daily.     No current facility-administered medications for this visit.    REVIEW OF SYSTEMS:  Const: negative fever, negative chills, fluctuating weight- when he was taking  care of his mother he weighed 129 pounds- After she passed away he was 180. He also was on marinol for PTSD.now he is around  Eyes: had nearly lost his vision he says due to a medicine and his PCP stopped it. He does not know what medicine it was. ENT: negative coryza, negative sore throat Resp: has copd but says it does not bother him much-  Cards: negative for chest pain, palpitations, lower extremity edema GU: negative for frequency, dysuria and hematuria Skin: negative for rash and pruritus Heme: negative for easy bruising and gum/nose bleeding MS: back pain Neurolo:negative for headaches, dizziness, vertigo, memory problems  Psych: PTSD  Objective:  VITALS:  BP 105/64   Pulse 80   Temp 97.9 F (36.6 C) (Temporal)   Ht '5\' 10"'$  (1.778 m)   Wt 146 lb (66.2 kg)   BMI 20.95 kg/m  PHYSICAL EXAM:  General: Alert, cooperative, no distress, appears stated age.  Head: Normocephalic, without obvious abnormality, atraumatic. Eyes: Conjunctivae clear, anicteric sclerae. Pupils are equal Full set of dentures Neck: Supple, symmetrical, no adenopathy, thyroid: non tender no carotid bruit and no JVD. Back: No CVA tenderness. Lungs: b/l rhonchi Heart: Regular rate and rhythm, no murmur, rub or gallop. Abdomen: Soft, non-tender,not distended. Bowel sounds normal. No masses Extremities: Extremities normal, atraumatic, no cyanosis. No edema. No clubbing Skin: dry skin Lymph: Cervical, supraclavicular normal. Neurologic: Grossly non-focal Pertinent Labs {NA  IMAGING RESULTS: Health  maintenance Vaccination  Vaccine Date last given comment  Influenza    Hepatitis B Has antibodies   Hepatitis A    Prevnar-PCV-13 ?   Pneumovac-PPSV-23 2020   TdaP 2021   HPV    Shingrix ( zoster vaccine)     ______________________  Labs Lab Result  Date comment  HIV VL <20 06/21/21   CD4 527 06/21/21   Genotype     [id number] NR    HIV antibody     RPR NR 06/21/21   Quantiferon Gold Negative    Hep C ab reactive    Hepatitis B-ab,ag,c Sab > 12.7    Hepatitis A-IgM, IgG /T Total-pos    Lipid TC 138, LDL 90, TGL 61, HDL 36    GC/CHL          HB,PLT,Cr, LFT 13.7, Plt 205      Preventive  Procedure Result  Date comment  colonoscopy  2018        Dental exam     Opthal       Impression/Recommendation ? ?HIV disease- he is well controlled- Vl < 20 and cd4 is 527 on triumeq which is a fixed drug combination of abacavir, 3TC, dolutegravir. Genosure archive was no resistance and hence he was switched to [employer] which is TTriumeq without Abacavir- He says he developed sob due to that and had to come to the hospital- noted to have NSTEMI but angio non obstructive coronaries- Was medically managed- He is back on triumeq and doing well  Smoker- had quit but now smoking a few   HTN on amlodipine- losartan HLD- on rosuvastatin  Treated Hepatits C- HCV RNA not detected  COPD- on inhalers, not on  home oxygen followed by Dr.Aleskerov   LBA- on MS contin Pain  management Dr.Crisp at [place]?  Pt does not remember the meds even when I read the meds- asked him to bring all his medicines the next time  Will do labs today- cd4, HIV rna, RPR, quant gold, CMP, UA ________________________________________________ Follow up 6  months or earlier

## 2022-07-26 LAB — RPR: RPR Ser Ql: NONREACTIVE

## 2022-07-26 LAB — HIV-1 RNA QUANT-NO REFLEX-BLD
HIV 1 RNA Quant: <20 copies/mL
LOG10 HIV-1 RNA: UNDETERMINED log10copy/mL

## 2022-07-27 LAB — QUANTIFERON-TB GOLD PLUS: QuantiFERON-TB Gold Plus: NEGATIVE

## 2022-07-27 LAB — QUANTIFERON-TB GOLD PLUS (RQFGPL)
QuantiFERON Mitogen Value: >10 IU/mL
QuantiFERON Nil Value: 0.07 IU/mL
QuantiFERON TB1 Ag Value: 0.08 IU/mL
QuantiFERON TB2 Ag Value: 0.07 IU/mL

## 2022-07-28 ENCOUNTER — Telehealth: Payer: Self-pay

## 2022-07-28 NOTE — Telephone Encounter (Signed)
Patient called to discuss recent results, relayed that RPR was negative and viral load undetectable.   Patient verbalized understanding and has no further questions.   Beryle Flock, RN

## 2022-08-08 ENCOUNTER — Encounter: Payer: Medicare Other | Admitting: Dermatology

## 2022-08-15 DIAGNOSIS — M79661 Pain in right lower leg: Secondary | ICD-10-CM | POA: Diagnosis not present

## 2022-08-15 DIAGNOSIS — M5432 Sciatica, left side: Secondary | ICD-10-CM | POA: Diagnosis not present

## 2022-08-15 DIAGNOSIS — M545 Low back pain, unspecified: Secondary | ICD-10-CM | POA: Diagnosis not present

## 2022-08-15 DIAGNOSIS — Z79891 Long term (current) use of opiate analgesic: Secondary | ICD-10-CM | POA: Diagnosis not present

## 2022-08-15 DIAGNOSIS — M79671 Pain in right foot: Secondary | ICD-10-CM | POA: Diagnosis not present

## 2022-08-15 DIAGNOSIS — M79672 Pain in left foot: Secondary | ICD-10-CM | POA: Diagnosis not present

## 2022-08-15 DIAGNOSIS — G894 Chronic pain syndrome: Secondary | ICD-10-CM | POA: Diagnosis not present

## 2022-08-15 DIAGNOSIS — G8929 Other chronic pain: Secondary | ICD-10-CM | POA: Diagnosis not present

## 2022-08-15 DIAGNOSIS — M79662 Pain in left lower leg: Secondary | ICD-10-CM | POA: Diagnosis not present

## 2022-08-25 DIAGNOSIS — E785 Hyperlipidemia, unspecified: Secondary | ICD-10-CM | POA: Diagnosis not present

## 2022-08-25 DIAGNOSIS — E559 Vitamin D deficiency, unspecified: Secondary | ICD-10-CM | POA: Diagnosis not present

## 2022-08-29 DIAGNOSIS — R110 Nausea: Secondary | ICD-10-CM | POA: Diagnosis not present

## 2022-08-29 DIAGNOSIS — M25562 Pain in left knee: Secondary | ICD-10-CM | POA: Diagnosis not present

## 2022-08-29 DIAGNOSIS — R634 Abnormal weight loss: Secondary | ICD-10-CM | POA: Diagnosis not present

## 2022-08-29 DIAGNOSIS — F32A Depression, unspecified: Secondary | ICD-10-CM | POA: Diagnosis not present

## 2022-08-29 DIAGNOSIS — J301 Allergic rhinitis due to pollen: Secondary | ICD-10-CM | POA: Diagnosis not present

## 2022-08-29 DIAGNOSIS — G8929 Other chronic pain: Secondary | ICD-10-CM | POA: Diagnosis not present

## 2022-08-29 DIAGNOSIS — E785 Hyperlipidemia, unspecified: Secondary | ICD-10-CM | POA: Diagnosis not present

## 2022-08-29 DIAGNOSIS — E559 Vitamin D deficiency, unspecified: Secondary | ICD-10-CM | POA: Diagnosis not present

## 2022-08-29 DIAGNOSIS — I10 Essential (primary) hypertension: Secondary | ICD-10-CM | POA: Diagnosis not present

## 2022-08-30 ENCOUNTER — Telehealth: Payer: Self-pay

## 2022-08-30 NOTE — Telephone Encounter (Signed)
Called patient to notify him to coordinate with PCP, no answer on either number and unable to leave voice message.   Beryle Flock, RN

## 2022-08-30 NOTE — Telephone Encounter (Signed)
Patient called requesting referral to psychiatrist so that he can get a prescription for marinol. States he's lost 5 pounds in the last two weeks. Says he used to be 170 pounds and is now 146. Will route to provider.   Beryle Flock, RN

## 2022-09-04 ENCOUNTER — Ambulatory Visit: Payer: Medicare Other | Admitting: Urology

## 2022-09-06 ENCOUNTER — Ambulatory Visit (INDEPENDENT_AMBULATORY_CARE_PROVIDER_SITE_OTHER): Payer: Medicare Other | Admitting: Urology

## 2022-09-06 ENCOUNTER — Encounter: Payer: Self-pay | Admitting: Urology

## 2022-09-06 VITALS — BP 93/59 | HR 82 | Ht 70.0 in | Wt 140.0 lb

## 2022-09-06 DIAGNOSIS — N401 Enlarged prostate with lower urinary tract symptoms: Secondary | ICD-10-CM

## 2022-09-06 LAB — URINALYSIS, COMPLETE
Bilirubin, UA: NEGATIVE
Leukocytes,UA: NEGATIVE
Nitrite, UA: NEGATIVE
Protein,UA: NEGATIVE
RBC, UA: NEGATIVE
Specific Gravity, UA: 1.015 (ref 1.005–1.030)
Urobilinogen, Ur: 0.2 mg/dL (ref 0.2–1.0)
pH, UA: 5 (ref 5.0–7.5)

## 2022-09-06 LAB — BLADDER SCAN AMB NON-IMAGING: Scan Result: 22

## 2022-09-06 LAB — MICROSCOPIC EXAMINATION
Bacteria, UA: NONE SEEN
RBC, Urine: NONE SEEN /hpf (ref 0–2)

## 2022-09-06 NOTE — Progress Notes (Signed)
09/06/2022 1:13 PM   Albert Wong 03/15/1956 [id number]  Referring provider: Jodi Marble, MD [place],  [name] [address]  Chief Complaint  Patient presents with   Benign Prostatic Hypertrophy    Urologic history: 1.  BPH with LUTS Initially seen 07/2021; IPSS 20/35 Bothersome symptoms frequency, hesitancy, weak stream and nocturia Symptoms improved with tamsulosin and did not recur after stopping   HPI: 66 y.o. male presents for annual follow-up.  Doing well since last visit No bothersome LUTS He had redness and irritation of his legs which he felt was a tamsulosin side effect and discontinued the medication Denies dysuria, gross hematuria Denies flank, abdominal or pelvic pain   PMH: Past Medical History:  Diagnosis Date   Anxiety    Chronic nausea    Degenerative disk disease    HIV (human immunodeficiency virus infection) ([place])    HIV (human immunodeficiency virus infection) ([place])    Insomnia    Muscle spasm    Rotator cuff tear     Surgical History: Past Surgical History:  Procedure Laterality Date   dental procedure N/A    LEFT HEART CATH AND CORONARY ANGIOGRAPHY N/A 09/16/2021   Procedure: LEFT HEART CATH AND CORONARY ANGIOGRAPHY;  Surgeon: Wellington Hampshire, MD;  Location: [facility];  Service: Cardiovascular;  Laterality: N/A;    Home Medications:  Allergies as of 09/06/2022       Reactions   Dovato [dolutegravir-lamivudine] Other (See Comments)   Severe Flu like symptoms Sweating  Shortness of breath Body aches-- Doubt this is Dovato allergy as he is tolerating truimeq which is dovato without abacavir   Entresto [sacubitril-valsartan] Photosensitivity   Dizziness   Metoprolol Succinate [metoprolol] Photosensitivity   Patients reported change in vision. D/c by pt pcp        Medication List        Accurate as of September 06, 2022  1:13 PM. If you have any questions, ask your nurse or doctor.           aspirin EC 81 MG tablet Take 1 tablet (81 mg total) by mouth daily. Swallow whole.   B-D 3CC LUER-LOK SYR 21GX1" 21G X 1" 3 ML Misc Generic drug: SYRINGE-NEEDLE (DISP) 3 ML USE AS DIRECTED WITH TESTOSTERONE   baclofen 10 MG tablet Commonly known as: LIORESAL TAKE 1 TABLET BY MOUTH EVERY DAY   Breztri Aerosphere 160-9-4.8 MCG/ACT Aero Generic drug: Budeson-Glycopyrrol-Formoterol Inhale 2 puffs into the lungs 2 (two) times daily.   budesonide-formoterol 80-4.5 MCG/ACT inhaler Commonly known as: SYMBICORT Inhale 2 puffs into the lungs 2 (two) times daily as needed.   cromolyn 4 % ophthalmic solution Commonly known as: OPTICROM SMARTSIG:1-2 Drop(s) In Eye(s) 4-6 Times Daily   diazepam 10 MG tablet Commonly known as: VALIUM Take 10 mg by mouth every 6 (six) hours as needed for anxiety.   dicyclomine 20 MG tablet Commonly known as: BENTYL Take 20 mg by mouth in the morning and at bedtime.   Farxiga 10 MG Tabs tablet Generic drug: dapagliflozin propanediol TAKE 1 TABLET BY MOUTH EVERY EVENING   Ipratropium-Albuterol 20-100 MCG/ACT Aers respimat Commonly known as: COMBIVENT Inhale into the lungs as directed.   ketoconazole 2 % cream Commonly known as: NIZORAL Apply 1 application topically daily as needed for irritation.   losartan 25 MG tablet Commonly known as: COZAAR TAKE ONE TABLET BY MOUTH DAILY   lubiprostone 24 MCG capsule Commonly known as: AMITIZA Take 24 mcg by mouth 2 (  two) times daily.   Melatonin 5 MG Caps Take 2 capsules by mouth at bedtime.   morphine 30 MG 12 hr tablet Commonly known as: MS CONTIN Take 1 tablet (30 mg total) by mouth 2 (two) times daily.   rosuvastatin 20 MG tablet Commonly known as: CRESTOR SMARTSIG:1 Tablet(s) By Mouth Every Evening   tamsulosin 0.4 MG Caps capsule Commonly known as: FLOMAX TAKE 1 CAPSULE BY MOUTH ONCE DAILY   Triumeq 600-50-300 MG tablet Generic drug: abacavir-dolutegravir-lamiVUDine Take 1 tablet by  mouth daily.   VITAMIN D PO Take 1,000 Units by mouth daily.        Allergies:  Allergies  Allergen Reactions   Dovato [Dolutegravir-Lamivudine] Other (See Comments)    Severe Flu like symptoms Sweating  Shortness of breath Body aches-- Doubt this is Dovato allergy as he is tolerating truimeq which is dovato without abacavir   Entresto [Sacubitril-Valsartan] Photosensitivity    Dizziness   Metoprolol Succinate [Metoprolol] Photosensitivity    Patients reported change in vision. D/c by pt pcp    Family History: Family History  Problem Relation Age of Onset   Alzheimer's disease Mother    Breast cancer Mother    Heart disease Father     Social History:  reports that he has been smoking cigarettes. He has a 39.00 pack-year smoking history. He has never used smokeless tobacco. He reports that he does not currently use alcohol. He reports that he does not use drugs.   Physical Exam: BP (!) 93/59   Pulse 82   Ht '5\' 10"'$  (1.778 m)   Wt 140 lb (63.5 kg)   BMI 20.09 kg/m   Constitutional:  Alert and oriented, No acute distress. HEENT: [place] AT, moist mucus membranes.  Trachea midline, no masses. Cardiovascular: No clubbing, cyanosis, or edema. Respiratory: Normal respiratory effort, no increased work of breathing. GU: Phallus without lesions.  Testes descended bilaterally no masses or tenderness.  2-3 cm left spermatocele   Assessment & Plan:    1.  BPH with LUTS Stable.  No bothersome symptoms off tamsulosin PVR: 22 mL Follow-up as needed for worsening voiding symptoms   Abbie Sons, MD  [facility] [address], [place], [place] [address] [phone]

## 2022-09-12 ENCOUNTER — Encounter: Payer: Self-pay | Admitting: Medical

## 2022-09-12 ENCOUNTER — Ambulatory Visit: Payer: Medicare Other | Attending: Medical | Admitting: Medical

## 2022-09-12 VITALS — BP 120/71 | HR 81 | Ht 70.5 in | Wt 141.0 lb

## 2022-09-12 DIAGNOSIS — I5022 Chronic systolic (congestive) heart failure: Secondary | ICD-10-CM | POA: Diagnosis not present

## 2022-09-12 DIAGNOSIS — E782 Mixed hyperlipidemia: Secondary | ICD-10-CM | POA: Diagnosis not present

## 2022-09-12 DIAGNOSIS — I10 Essential (primary) hypertension: Secondary | ICD-10-CM

## 2022-09-12 DIAGNOSIS — I2510 Atherosclerotic heart disease of native coronary artery without angina pectoris: Secondary | ICD-10-CM

## 2022-09-12 DIAGNOSIS — B20 Human immunodeficiency virus [HIV] disease: Secondary | ICD-10-CM

## 2022-09-12 DIAGNOSIS — I428 Other cardiomyopathies: Secondary | ICD-10-CM | POA: Diagnosis not present

## 2022-09-12 MED ORDER — SPIRONOLACTONE 25 MG PO TABS: 12.5000 mg | ORAL_TABLET | Freq: Every day | ORAL | 3 refills | Status: DC

## 2022-09-12 NOTE — Progress Notes (Signed)
Cardiology Office Note:    Date:  09/12/2022   ID:  [name], DOB 1956-06-12, MRN [id number]  PCP:  Albert Marble, MD  [facility] Cardiologist:  None  [facility] Electrophysiologist:  None   Referring MD: Albert Marble, MD   Chief Complaint: 6 month follow-up  History of Present Illness:    Albert Wong is a 66 y.o. male with a hx of HIV disease diagnosed in early 40s, treated hepatitis, COPD, NICM, HFmrEF, nonobstructive CAD by cath 08/2021, HTN tobacco use who is being seen for follow-up.    Patient came into the [facility] ER 09/16/21 for shortness of breath. Patient's HIV medications were recently switched to [place].Hst troponin was elevated. LHC showed mild to moderate nonobstructive CAD with moderately reduced LVSF, mildly elevated LVEDP. Started on Aspirin, Lopressor, crestor, jardiance, lasix '20mg'$  daily. Echo showed LVEF 40-45%, g1dd. He was started on GDMT. He stopped metoprolol due to side affects.    Seen 11/29/21 in televisit and was overall doing better. Limited echo was ordered which showed minimal improvement of LVEF to 45-50%.   Last seen 03/16/22 and was overall doing well. Bps were soft, limiting titration of GDMT.   Today, the patient reports chronic and unchanged SOB. It's worse with exertion. He denies chest pain. Denies lower leg edema. He feels Losartan may cause flu-like symptoms at night, I recommended he take it during the day. BP is good today, he is willing to try spironolactone.     Past Medical History:  Diagnosis Date   Anxiety    Chronic nausea    Degenerative disk disease    HIV (human immunodeficiency virus infection) ([place])    HIV (human immunodeficiency virus infection) ([place])    Insomnia    Muscle spasm    Rotator cuff tear     Past Surgical History:  Procedure Laterality Date   dental procedure N/A    LEFT HEART CATH AND CORONARY ANGIOGRAPHY N/A 09/16/2021   Procedure: LEFT HEART CATH AND CORONARY ANGIOGRAPHY;  Surgeon: Albert Hampshire, MD;  Location: [facility];  Service: Cardiovascular;  Laterality: N/A;    Current Medications: Current Meds  Medication Sig   aspirin EC 81 MG EC tablet Take 1 tablet (81 mg total) by mouth daily. Swallow whole.   B-D 3CC LUER-LOK SYR 21GX1" 21G X 1" 3 ML MISC USE AS DIRECTED WITH TESTOSTERONE   baclofen (LIORESAL) 10 MG tablet TAKE 1 TABLET BY MOUTH EVERY DAY   BREZTRI AEROSPHERE 160-9-4.8 MCG/ACT AERO Inhale 2 puffs into the lungs 2 (two) times daily.   budesonide-formoterol (SYMBICORT) 80-4.5 MCG/ACT inhaler Inhale 2 puffs into the lungs 2 (two) times daily as needed.   cromolyn (OPTICROM) 4 % ophthalmic solution SMARTSIG:1-2 Drop(s) In Eye(s) 4-6 Times Daily   dapagliflozin propanediol (FARXIGA) 10 MG TABS tablet TAKE 1 TABLET BY MOUTH EVERY EVENING   diazepam (VALIUM) 10 MG tablet Take 10 mg by mouth every 6 (six) hours as needed for anxiety.   dicyclomine (BENTYL) 20 MG tablet Take 20 mg by mouth in the morning and at bedtime.   Ipratropium-Albuterol (COMBIVENT) 20-100 MCG/ACT AERS respimat Inhale into the lungs as directed.   ketoconazole (NIZORAL) 2 % cream Apply 1 application topically daily as needed for irritation.   losartan (COZAAR) 25 MG tablet TAKE ONE TABLET BY MOUTH DAILY   lubiprostone (AMITIZA) 24 MCG capsule Take 24 mcg by mouth 2 (two) times daily.   Melatonin 5 MG CAPS Take 2 capsules  by mouth at bedtime.   morphine (MS CONTIN) 30 MG 12 hr tablet Take 1 tablet (30 mg total) by mouth 2 (two) times daily.   rosuvastatin (CRESTOR) 20 MG tablet SMARTSIG:1 Tablet(s) By Mouth Every Evening   spironolactone (ALDACTONE) 25 MG tablet Take 0.5 tablets (12.5 mg total) by mouth daily.   TRIUMEQ 600-50-300 MG tablet Take 1 tablet by mouth daily.   VITAMIN D PO Take 1,000 Units by mouth daily.     Allergies:   Dovato [dolutegravir-lamivudine], Entresto [sacubitril-valsartan], and Metoprolol succinate [metoprolol]   Social History   Socioeconomic History    Marital status: Single    Spouse name: Not on file   Number of children: Not on file   Years of education: Not on file   Highest education level: Not on file  Occupational History   Not on file  Tobacco Use   Smoking status: Some Days    Packs/day: 1.00    Years: 39.00    Total pack years: 39.00    Types: Cigarettes   Smokeless tobacco: Never   Tobacco comments:    Smokes about 6 cigarettes a week.  Vaping Use   Vaping Use: Former   Devices: tried when they first came out for about 2 weeks  Substance and Sexual Activity   Alcohol use: Not Currently   Drug use: No   Sexual activity: Not on file  Other Topics Concern   Not on file  Social History Narrative   Not on file   Social Determinants of Health   Financial Resource Strain: Not on file  Food Insecurity: Not on file  Transportation Needs: Not on file  Physical Activity: Not on file  Stress: Not on file  Social Connections: Not on file     Family History: The patient's family history includes Alzheimer's disease in his mother; Breast cancer in his mother; Heart disease in his father.  ROS:   Please see the history of present illness.     All other systems reviewed and are negative.  EKGs/Labs/Other Studies Reviewed:    The following studies were reviewed today:  Echo limited 12/2021  1. Left ventricular ejection fraction, by estimation, is 45 to 50%. The  left ventricle has mildly decreased function. The left ventricle  demonstrates global hypokinesis. Left ventricular diastolic parameters are  consistent with Grade I diastolic  dysfunction (impaired relaxation).   2. Right ventricular systolic function is normal. The right ventricular  size is normal. Tricuspid regurgitation signal is inadequate for assessing  PA pressure.   3. The mitral valve is normal in structure. No evidence of mitral valve  regurgitation. No evidence of mitral stenosis.   4. The aortic valve is normal in structure. Aortic valve  regurgitation is  not visualized. No aortic stenosis is present.   5. The inferior vena cava is normal in size with greater than 50%  respiratory variability, suggesting right atrial pressure of 3 mmHg     Echo 08/2022 1. Left ventricular ejection fraction, by estimation, is 40 to 45%. The  left ventricle has mildly decreased function. The left ventricle  demonstrates global hypokinesis. Left ventricular diastolic parameters are  consistent with Grade I diastolic  dysfunction (impaired relaxation).   2. Right ventricular systolic function is mildly reduced. The right  ventricular size is mildly enlarged. Tricuspid regurgitation signal is  inadequate for assessing PA pressure.   3. The inferior vena cava is dilated in size with >50% respiratory  variability, suggesting right atrial pressure of  8 mmHg.    Cardiac cath 09/16/21   Mid Cx lesion is 40% stenosed.   Mid RCA lesion is 30% stenosed.   Dist RCA lesion is 40% stenosed.   There is mild left ventricular systolic dysfunction.   LV end diastolic pressure is moderately elevated.   The left ventricular ejection fraction is 35-45% by visual estimate.   1.  Mild to moderate nonobstructive coronary artery disease. 2.  Moderately reduced LV systolic function with wall motion abnormality suggestive of stress-induced cardiomyopathy.  Mildly elevated left ventricular end-diastolic pressure.   Recommendations: Recommend medical therapy for nonobstructive coronary artery disease. Obtain an echocardiogram to better evaluate wall motion.  EKG:  EKG is ordered today.  The ekg ordered today demonstrates NSR 81bpm, nonspecific T wave changes  Recent Labs: 09/16/2021: B Natriuretic Peptide 48.7 05/30/2022: Hemoglobin 17.1; Platelets 235 07/25/2022: ALT 8; BUN 7; Creatinine, Ser 0.96; Potassium 3.7; Sodium 140  Recent Lipid Panel    Component Value Date/Time   CHOL 138 09/17/2021 0607   TRIG 61 09/17/2021 0607   HDL 36 (L) 09/17/2021 0607    CHOLHDL 3.8 09/17/2021 0607   VLDL 12 09/17/2021 0607   LDLCALC 90 09/17/2021 0607    Physical Exam:    VS:  BP 120/71 (BP Location: Right Arm, Patient Position: Lying right side, Cuff Size: Normal)   Pulse 81   Ht 5' 10.5" (1.791 m)   Wt 141 lb (64 kg)   SpO2 95%   BMI 19.95 kg/m     Wt Readings from Last 3 Encounters:  09/12/22 141 lb (64 kg)  09/06/22 140 lb (63.5 kg)  07/25/22 146 lb (66.2 kg)     GEN:  Well nourished, well developed in no acute distress HEENT: Normal NECK: No JVD; No carotid bruits LYMPHATICS: No lymphadenopathy CARDIAC: RRR, no murmurs, rubs, gallops RESPIRATORY:  Clear to auscultation without rales, wheezing or rhonchi  ABDOMEN: Soft, non-tender, non-distended MUSCULOSKELETAL:  No edema; No deformity  SKIN: Warm and dry NEUROLOGIC:  Alert and oriented x 3 PSYCHIATRIC:  Normal affect   ASSESSMENT:    1. Coronary artery disease involving native coronary artery of native heart without angina pectoris   2. Hyperlipidemia, mixed   3. Chronic systolic heart failure ([place])   4. Nonischemic cardiomyopathy ([place])   5. Essential hypertension   6. HIV infection, unspecified symptom status ([place])    PLAN:    In order of problems listed above:  Nonobstructive CAD Cath 08/2021 showed nonobstructive CAD. Patient denies anginal symptoms. Continue ASA and statin. He previously stopped metoprolol due to adverse side affects. No further ischemic work-up at this time.   HFrEF NICM Repeat echocardiogram showed LVEF 45-50%, G1DD. Patient is euvolemic on exam today. He is taking Losartan '25mg'$  daily and Farxiga '10mg'$  daily. BP is good today. I will start spironolactone 12.'5mg'$  daily, BMET in 2 weeks.   HTN BP is good today. Continue current medications.   HLD LDL 90 in 2022. I will update fasting lipid panel. Continue Crestor '40mg'$  daily.   Smoker/COPD He is still smoking, 2-5 cigarettes daily. Cessation recommended.   HIV Followed by ID.   Disposition:  Follow up in 2 week(s) with MD/APP      Signed, Gurjit Loconte Ninfa Meeker, PA-C  09/12/2022 1:15 PM     [facility]

## 2022-09-12 NOTE — Patient Instructions (Signed)
Medication Instructions:   Your physician has recommended you make the following change in your medication:  START Spironolactone - take 0.5 tablet (12.'5mg'$ ) by mouth daily.   *If you need a refill on your cardiac medications before your next appointment, please call your pharmacy*   Lab Work:  Your physician recommends that you return for lab work in: 2 weeks at the medical mall.   If you have labs (blood work) drawn today and your tests are completely normal, you will receive your results only by: [facility] (if you have MyChart) OR A paper copy in the mail If you have any lab test that is abnormal or we need to change your treatment, we will call you to review the results.   Testing/Procedures:  None Ordered   Follow-Up: At [facility], you and your health needs are our priority.  As part of our continuing mission to provide you with exceptional heart care, we have created designated Provider Care Teams.  These Care Teams include your primary Cardiologist (physician) and Advanced Practice Providers (APPs -  Physician Assistants and Nurse Practitioners) who all work together to provide you with the care you need, when you need it.  We recommend signing up for the patient portal called "MyChart".  Sign up information is provided on this After Visit Summary.  MyChart is used to connect with patients for Virtual Visits (Telemedicine).  Patients are able to view lab/test results, encounter notes, upcoming appointments, etc.  Non-urgent messages can be sent to your provider as well.   To learn more about what you can do with MyChart, go to [email or web address].    Your next appointment:   6 month(s)  The format for your next appointment:   In Person  Provider:   You may see Dr. Fletcher Anon or one of the following Advanced Practice Providers on your designated Care Team:   Murray Hodgkins, NP Christell Faith, PA-C Cadence Kathlen Mody, PA-C Gerrie Nordmann, NP    Important  Information About Sugar

## 2022-09-13 ENCOUNTER — Telehealth: Payer: Self-pay | Admitting: Medical

## 2022-09-13 NOTE — Telephone Encounter (Signed)
Spoke with pt who states he took Spironolactone '25mg'$  - 0.5 tablet last evening at 6pm and pt states medication caused flu-like symptoms (nausea, tachycardia,body-ache).  Temp normal at 98 and symptoms have improved this morning.  Pt states he will not be taking Spironolactone again and will not take Losartan tonight because it makes him feel "just as horrible."  If he sleeps well tonight he will not be taking the Losartan again either.  He has not been able to check his BP as the batteries are dead in this monitor.  He plans to get them replaced today.   Pt advised will forward to Cadence Furth, PA-C to review and advise.  Pt verbalizes understanding and agrees with current plan.

## 2022-09-13 NOTE — Telephone Encounter (Signed)
Pt c/o medication issue:  1. Name of Medication: spironolactone (ALDACTONE) 25 MG tablet  2. How are you currently taking this medication (dosage and times per day)?  Take 0.5 tablets (12.5 mg total) by mouth daily.  3. Are you having a reaction (difficulty breathing--STAT)?   4. What is your medication issue? Pt states she started this medication yesterday and it made him feel worse last night. He states it made him feel like he was going to have a heart attack

## 2022-09-18 ENCOUNTER — Other Ambulatory Visit: Payer: Self-pay | Admitting: Urology

## 2022-09-26 DIAGNOSIS — Z79891 Long term (current) use of opiate analgesic: Secondary | ICD-10-CM | POA: Diagnosis not present

## 2022-09-26 DIAGNOSIS — M79671 Pain in right foot: Secondary | ICD-10-CM | POA: Diagnosis not present

## 2022-09-26 DIAGNOSIS — G8929 Other chronic pain: Secondary | ICD-10-CM | POA: Diagnosis not present

## 2022-09-26 DIAGNOSIS — M79662 Pain in left lower leg: Secondary | ICD-10-CM | POA: Diagnosis not present

## 2022-09-26 DIAGNOSIS — M79672 Pain in left foot: Secondary | ICD-10-CM | POA: Diagnosis not present

## 2022-09-26 DIAGNOSIS — G894 Chronic pain syndrome: Secondary | ICD-10-CM | POA: Diagnosis not present

## 2022-09-26 DIAGNOSIS — M545 Low back pain, unspecified: Secondary | ICD-10-CM | POA: Diagnosis not present

## 2022-09-26 DIAGNOSIS — M79661 Pain in right lower leg: Secondary | ICD-10-CM | POA: Diagnosis not present

## 2022-09-26 DIAGNOSIS — M5432 Sciatica, left side: Secondary | ICD-10-CM | POA: Diagnosis not present

## 2022-09-26 IMAGING — CT CT ANGIO CHEST
2 of 6 series · 18 of 46 positions shown · IV contrast (APPLIED)
Comparison: Same day chest radiograph, CT chest 04/05/2021

CLINICAL DATA: Shortness of breath

EXAM:
CT ANGIOGRAPHY CHEST WITH CONTRAST
TECHNIQUE: Multidetector CT imaging of the chest was performed using the
standard protocol during bolus administration of intravenous
contrast. Multiplanar CT image reconstructions and MIPs were
obtained to evaluate the vascular anatomy.
CONTRAST:  75mL OMNIPAQUE IOHEXOL 350 MG/ML SOLN

[Series 5: thins · axial · 0.76mm/px · z∈[-506,-208]mm · 16 of 328 slices shown]
[im 15/328  lung]
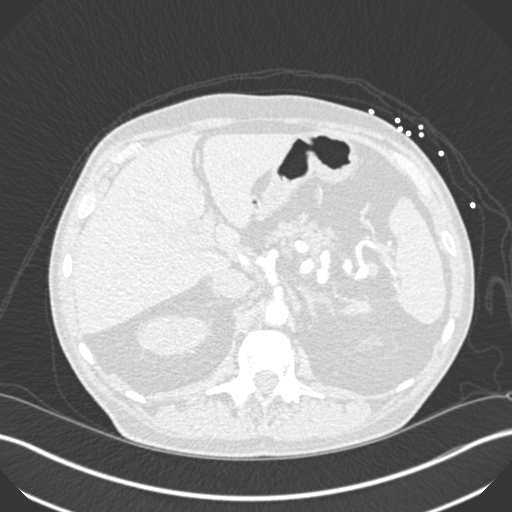
[im 43/328  soft-tissue]
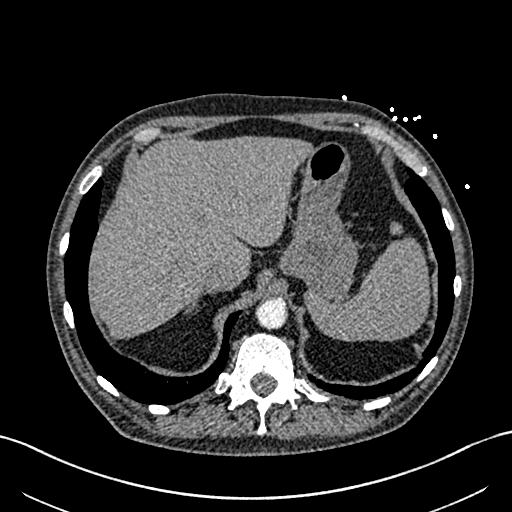
[im 57/328  lung]
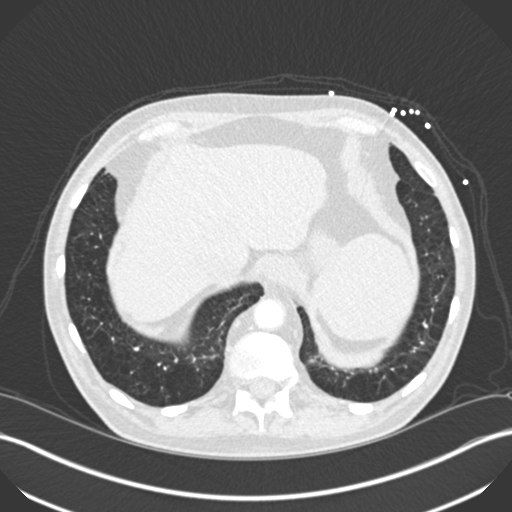
[im 72/328  soft-tissue]
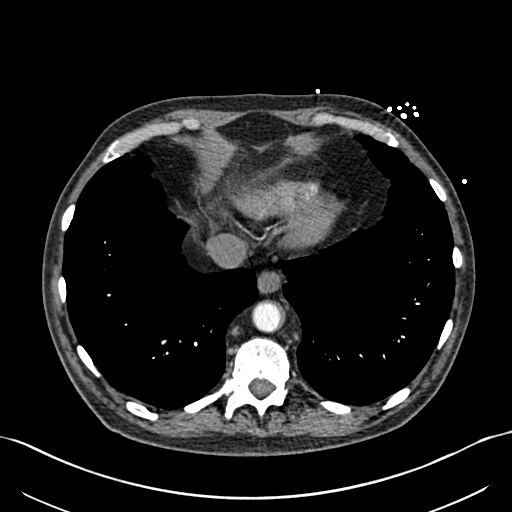
[im 100/328  lung]
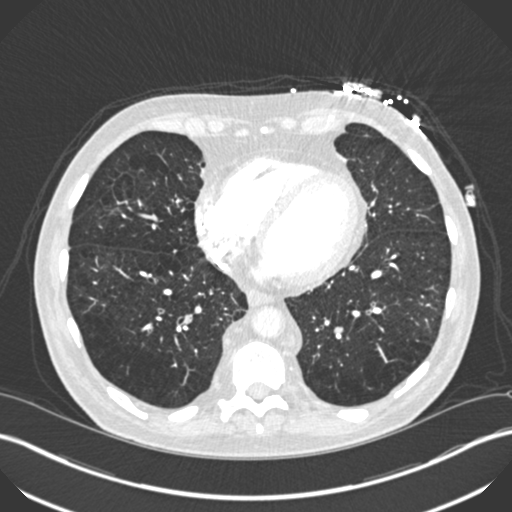
[im 114/328  soft-tissue]
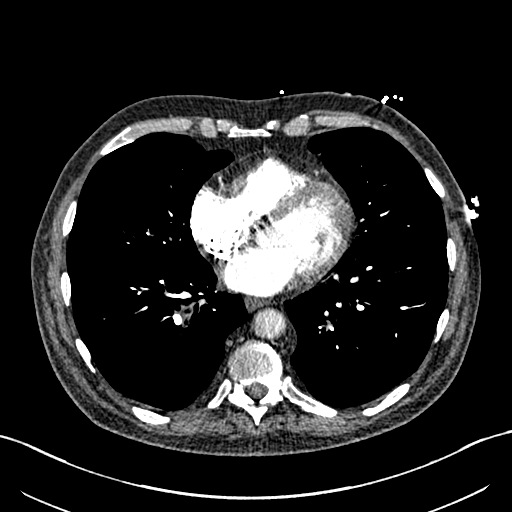
[im 128/328  lung]
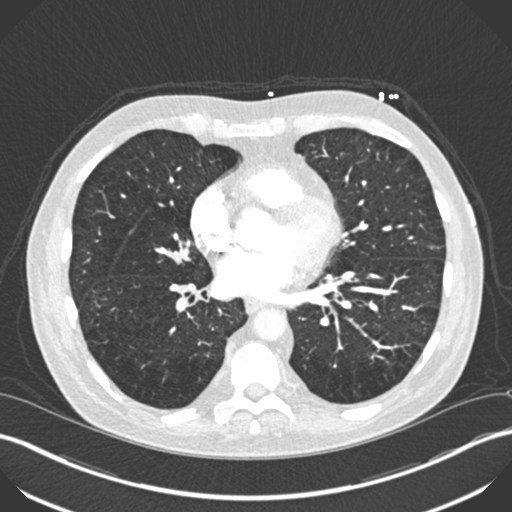
[im 157/328  soft-tissue]
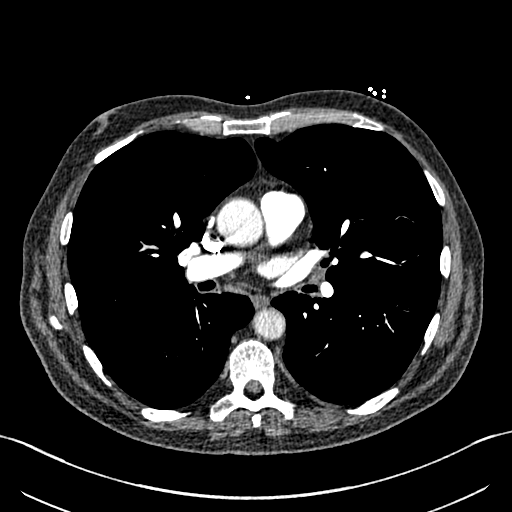
[im 171/328  lung]
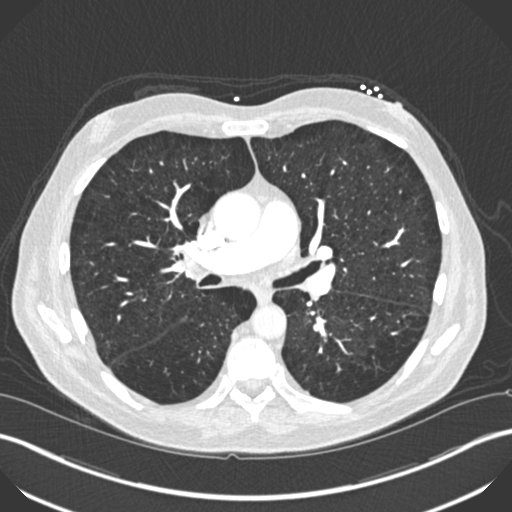
[im 200/328  soft-tissue]
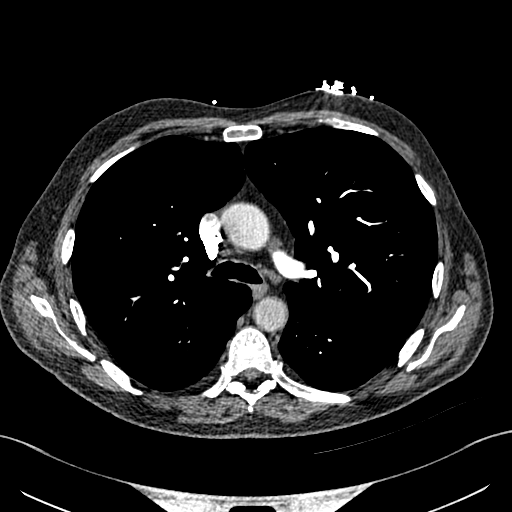
[im 214/328  lung]
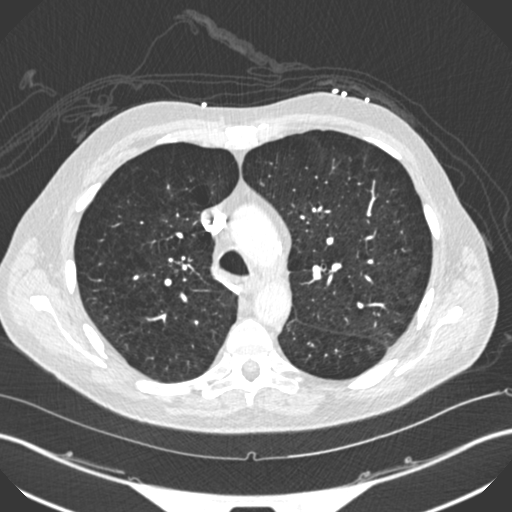
[im 228/328  soft-tissue]
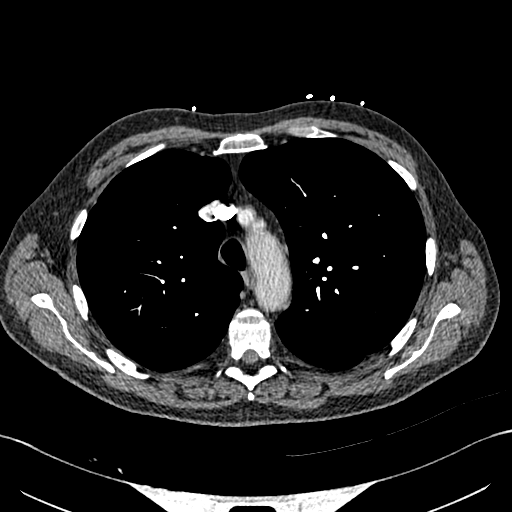
[im 256/328  lung]
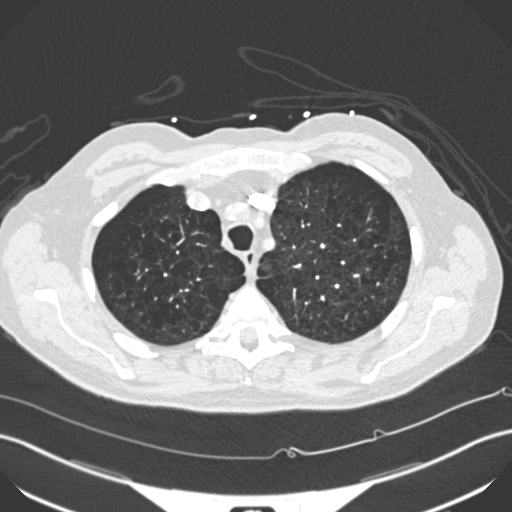
[im 271/328  soft-tissue]
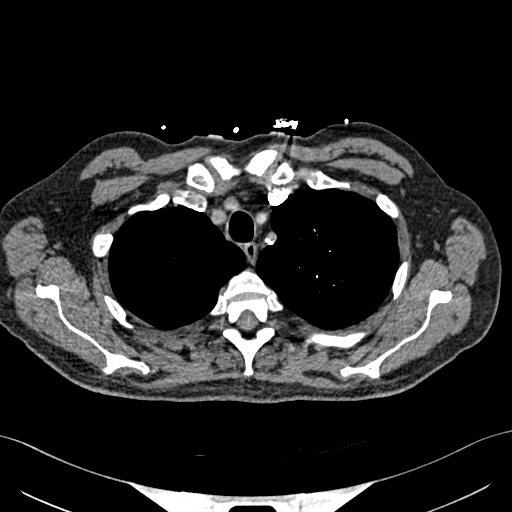
[im 285/328  lung]
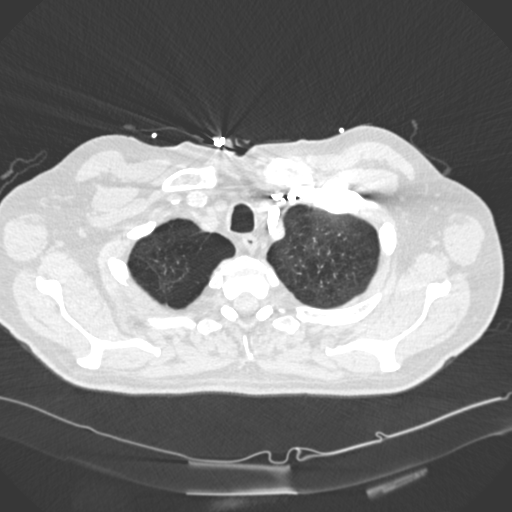
[im 313/328  soft-tissue]
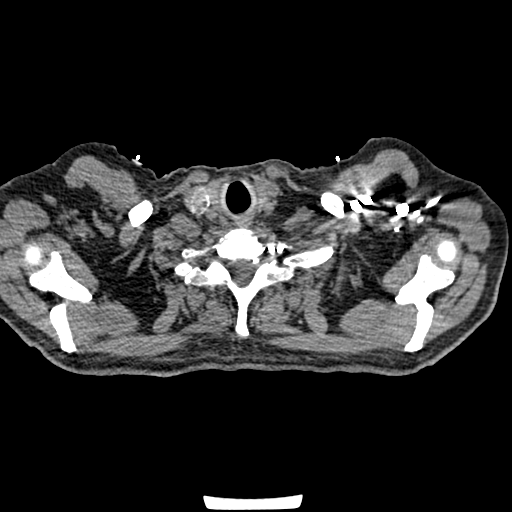

[Series 7: coronal mpr · coronal · 0.64mm/px · 2 of 100 slices shown]
[im 34/100  soft-tissue]
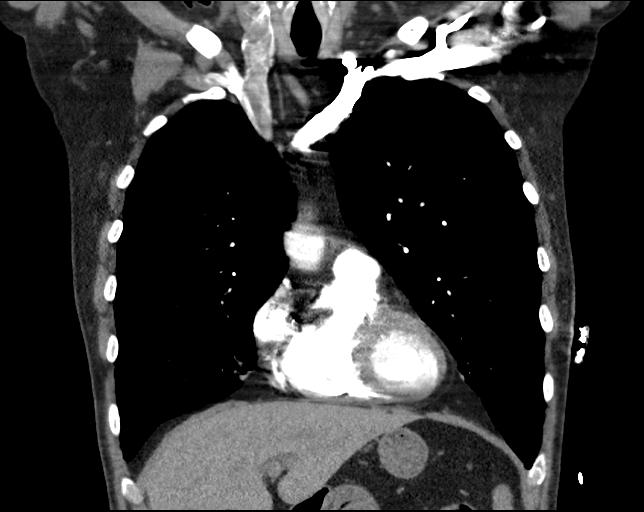
[im 67/100  soft-tissue]
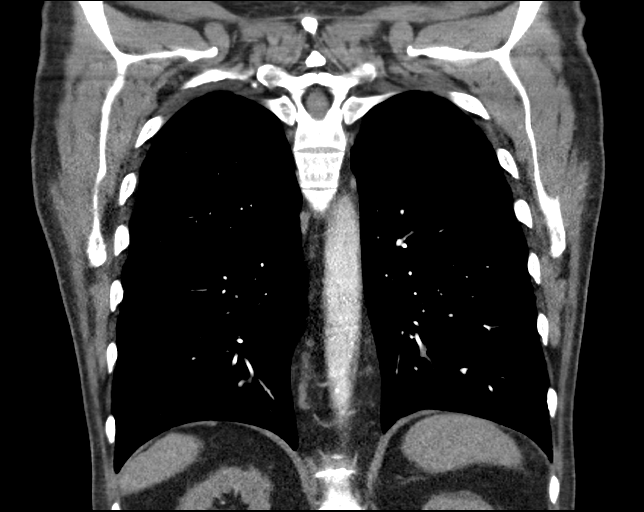

[18 of 46 positions shown; findings below may reference images not displayed]

FINDINGS: Cardiovascular: There is satisfactory opacification of the pulmonary
arteries to the segmental level. There is no evidence of acute or
chronic pulmonary embolism. The heart is not enlarged. There is no
pericardial effusion. There is mild scattered coronary artery
calcification and minimal calcified atherosclerotic plaque of the
aortic arch.

Mediastinum/Nodes: The imaged thyroid is unremarkable. The esophagus
is grossly unremarkable. There is no mediastinal, hilar, or axillary
lymphadenopathy.

Lungs/Pleura: The trachea and central airways are patent. There is
scattered debris in the trachea and central airways with areas of
mucoid impaction distally. There is mild central bronchial wall
thickening.

There is a background of moderate centrilobular and paraseptal
emphysema. Patchy opacities in the right middle lobe and lingula are
similar to the prior CT and likely reflect scar. There is no new
focal consolidation. There is no pulmonary edema. There is no
pleural effusion or pneumothorax.

Upper Abdomen: The imaged portions of the upper abdominal viscera
are unremarkable.

Musculoskeletal: There is no acute osseous abnormality or aggressive
osseous lesion.

Review of the MIP images confirms the above findings.
IMPRESSION: 1. No evidence of pulmonary embolism.
2. Debris in the trachea and airways consistent with aspiration.
3. Background of moderate centrilobular and paraseptal emphysema,
unchanged.

Aortic Atherosclerosis (PDBD8-7NX.X) and Emphysema (PDBD8-UOC.D).

## 2022-10-03 ENCOUNTER — Telehealth: Payer: Self-pay | Admitting: Medical

## 2022-10-03 NOTE — Telephone Encounter (Signed)
Spoke with patient and he had some problems in regards to his pain medications. He states that he will be 10 days short on his pain medication. He wanted to see if we could prescribe him enough to last until his next prescription is due. Advised that we do not prescribe pain medications from our office and that he would need to reach out to his pain management clinic for further assistance with that. Patient states this is causing his "nerves and PTSD" to act up. He then wanted to know if he should go through withdrawal would that potentially cause a heart attack. Reiterated that he should check with pain management about medication. Will send over to provider for any further input. No further needs.

## 2022-10-03 NOTE — Telephone Encounter (Signed)
Pt would like a callback regarding his pain medication. Please advise

## 2022-10-06 ENCOUNTER — Telehealth: Payer: Self-pay

## 2022-10-06 NOTE — Telephone Encounter (Signed)
Patient called to ask how to stop his HIV medication. Patient stated he was having withdraws from not having his pain medication and would like to stop all medications.  Patient was advised to reach out to his pain clinic or pcp regarding his withdraws and if symptoms worsen ED.  I also advised patient that we to not recommend stopping his HIV medications. I encourage him to continue taking his Triumeq and explained the risk and side effects of stopping the medication. Patient verbalized understanding.

## 2022-10-24 ENCOUNTER — Ambulatory Visit: Payer: Self-pay | Admitting: Psychiatry

## 2022-11-01 ENCOUNTER — Telehealth: Payer: Self-pay

## 2022-11-01 NOTE — Telephone Encounter (Signed)
Inquiring about Cabenuva injection. Advised he can set up appointment to discuss with Dr Delaine Lame what his options are and if he is a candidate or not. He refused at this time but will discuss at yearly follow up.

## 2022-11-30 NOTE — Progress Notes (Deleted)
Psychiatric Initial Adult Assessment   Patient Identification: Albert Wong MRN:  [id number] Date of Evaluation:  11/30/2022 Referral Source: *** Chief Complaint:  No chief complaint on file.  Visit Diagnosis: No diagnosis found.  History of Present Illness:   Albert Wong is a 66 y.o. year old male with a history of depression, anxiety,  HIV disease diagnosed in early 80s, treated hepatitis, COPD, NICM, HFmrEF, nonobstructive CAD by cath 08/2021, HTN tobacco use, who is referred for depression.   Mirtazapine- Si         Associated Signs/Symptoms: Depression Symptoms:  {DEPRESSION SYMPTOMS:20000} (Hypo) Manic Symptoms:  {BHH MANIC SYMPTOMS:22872} Anxiety Symptoms:  {BHH ANXIETY SYMPTOMS:22873} Psychotic Symptoms:  {BHH PSYCHOTIC SYMPTOMS:22874} PTSD Symptoms: {BHH PTSD SYMPTOMS:22875}  Past Psychiatric History:  Outpatient:  Psychiatry admission:  Previous suicide attempt:  Past trials of medication:  History of violence:  History of head injury:   Previous Psychotropic Medications: {YES/NO:21197}  Substance Abuse History in the last 12 months:  {yes no:314532}  Consequences of Substance Abuse: {BHH CONSEQUENCES OF SUBSTANCE ABUSE:22880}  Past Medical History:  Past Medical History:  Diagnosis Date   Anxiety    Chronic nausea    Degenerative disk disease    HIV (human immunodeficiency virus infection) (HCC)    HIV (human immunodeficiency virus infection) (HCC)    Insomnia    Muscle spasm    Rotator cuff tear     Past Surgical History:  Procedure Laterality Date   dental procedure N/A    LEFT HEART CATH AND CORONARY ANGIOGRAPHY N/A 09/16/2021   Procedure: LEFT HEART CATH AND CORONARY ANGIOGRAPHY;  Surgeon: Wellington Hampshire, MD;  Location: [facility];  Service: Cardiovascular;  Laterality: N/A;    Family Psychiatric History: ***  Family History:  Family History  Problem Relation Age of Onset   Alzheimer's disease Mother    Breast cancer  Mother    Heart disease Father     Social History:   Social History   Socioeconomic History   Marital status: Single    Spouse name: Not on file   Number of children: Not on file   Years of education: Not on file   Highest education level: Not on file  Occupational History   Not on file  Tobacco Use   Smoking status: Some Days    Packs/day: 1.00    Years: 39.00    Total pack years: 39.00    Types: Cigarettes   Smokeless tobacco: Never   Tobacco comments:    Smokes about 6 cigarettes a week.  Vaping Use   Vaping Use: Former   Devices: tried when they first came out for about 2 weeks  Substance and Sexual Activity   Alcohol use: Not Currently   Drug use: No   Sexual activity: Not on file  Other Topics Concern   Not on file  Social History Narrative   Not on file   Social Determinants of Health   Financial Resource Strain: Not on file  Food Insecurity: Not on file  Transportation Needs: Not on file  Physical Activity: Not on file  Stress: Not on file  Social Connections: Not on file    Additional Social History: ***  Allergies:   Allergies  Allergen Reactions   Dovato [Dolutegravir-Lamivudine] Other (See Comments)    Severe Flu like symptoms Sweating  Shortness of breath Body aches-- Doubt this is Dovato allergy as he is tolerating truimeq which is dovato without abacavir   Entresto [Sacubitril-Valsartan] Photosensitivity  Dizziness   Metoprolol Succinate [Metoprolol] Photosensitivity    Patients reported change in vision. D/c by pt pcp    Metabolic Disorder Labs: Lab Results  Component Value Date   HGBA1C 5.5 09/16/2021   MPG 111 09/16/2021   No results found for: "PROLACTIN" Lab Results  Component Value Date   CHOL 138 09/17/2021   TRIG 61 09/17/2021   HDL 36 (L) 09/17/2021   CHOLHDL 3.8 09/17/2021   VLDL 12 09/17/2021   LDLCALC 90 09/17/2021   LDLCALC 75 01/01/2013   Lab Results  Component Value Date   TSH 2.517 07/21/2021     Therapeutic Level Labs: No results found for: "LITHIUM" No results found for: "CBMZ" No results found for: "VALPROATE"  Current Medications: Current Outpatient Medications  Medication Sig Dispense Refill   aspirin EC 81 MG EC tablet Take 1 tablet (81 mg total) by mouth daily. Swallow whole. 30 tablet 2   B-D 3CC LUER-LOK SYR 21GX1" 21G X 1" 3 ML MISC USE AS DIRECTED WITH TESTOSTERONE 24 each 6   baclofen (LIORESAL) 10 MG tablet TAKE 1 TABLET BY MOUTH EVERY DAY 30 tablet 11   BREZTRI AEROSPHERE 160-9-4.8 MCG/ACT AERO Inhale 2 puffs into the lungs 2 (two) times daily.     budesonide-formoterol (SYMBICORT) 80-4.5 MCG/ACT inhaler Inhale 2 puffs into the lungs 2 (two) times daily as needed.     cromolyn (OPTICROM) 4 % ophthalmic solution SMARTSIG:1-2 Drop(s) In Eye(s) 4-6 Times Daily     dapagliflozin propanediol (FARXIGA) 10 MG TABS tablet TAKE 1 TABLET BY MOUTH EVERY EVENING 90 tablet 3   diazepam (VALIUM) 10 MG tablet Take 10 mg by mouth every 6 (six) hours as needed for anxiety.     dicyclomine (BENTYL) 20 MG tablet Take 20 mg by mouth in the morning and at bedtime.     Ipratropium-Albuterol (COMBIVENT) 20-100 MCG/ACT AERS respimat Inhale into the lungs as directed.     ketoconazole (NIZORAL) 2 % cream Apply 1 application topically daily as needed for irritation.     losartan (COZAAR) 25 MG tablet TAKE ONE TABLET BY MOUTH DAILY 90 tablet 3   lubiprostone (AMITIZA) 24 MCG capsule Take 24 mcg by mouth 2 (two) times daily.     Melatonin 5 MG CAPS Take 2 capsules by mouth at bedtime.     morphine (MS CONTIN) 30 MG 12 hr tablet Take 1 tablet (30 mg total) by mouth 2 (two) times daily. 60 tablet 0   rosuvastatin (CRESTOR) 20 MG tablet SMARTSIG:1 Tablet(s) By Mouth Every Evening     spironolactone (ALDACTONE) 25 MG tablet Take 0.5 tablets (12.5 mg total) by mouth daily. 45 tablet 3   tamsulosin (FLOMAX) 0.4 MG CAPS capsule TAKE 1 CAPSULE BY MOUTH ONCE DAILY (Patient not taking: Reported on  09/12/2022) 30 capsule 11   TRIUMEQ 600-50-300 MG tablet Take 1 tablet by mouth daily. 30 tablet 6   VITAMIN D PO Take 1,000 Units by mouth daily.     No current facility-administered medications for this visit.    Musculoskeletal: Strength & Muscle Tone: within normal limits Gait & Station: normal Patient leans: N/A  Psychiatric Specialty Exam: Review of Systems  There were no vitals taken for this visit.There is no height or weight on file to calculate BMI.  General Appearance: {Appearance:22683}  Eye Contact:  {BHH EYE CONTACT:22684}  Speech:  Clear and Coherent  Volume:  Normal  Mood:  {BHH MOOD:22306}  Affect:  {Affect (PAA):22687}  Thought Process:  Coherent  Orientation:  Full (Time, Place, and Person)  Thought Content:  Logical  Suicidal Thoughts:  {ST/HT (PAA):22692}  Homicidal Thoughts:  {ST/HT (PAA):22692}  Memory:  Immediate;   Good  Judgement:  {Judgement (PAA):22694}  Insight:  {Insight (PAA):22695}  Psychomotor Activity:  Normal  Concentration:  Concentration: Good and Attention Span: Good  Recall:  Good  Fund of Knowledge:Good  Language: Good  Akathisia:  No  Handed:  Right  AIMS (if indicated):  not done  Assets:  Communication Skills Desire for Improvement  ADL's:  Intact  Cognition: WNL  Sleep:  {BHH GOOD/FAIR/POOR:22877}   Screenings: PHQ2-9    [place] Office Visit from 07/25/2022 in [facility] Video Visit from 05/25/2022 in [facility] from 04/18/2021 in [facility] Office Visit from 05/07/2013 in [facility] Office Visit from 01/30/2013 in [facility]  PHQ-2 Total Score '1 1 1 '$ 0 0  PHQ-9 Total Score -- -- 4 -- --      Flowsheet Row ED to Hosp-Admission (Discharged) from 09/16/2021 in [facility]  C-SSRS RISK CATEGORY No Risk       Assessment and Plan:   Assessment  Plan   The patient demonstrates the following risk factors for suicide: Chronic risk factors for suicide include: {Chronic Risk Factors for YWVPXTG:62694854}. Acute risk factors for suicide include: {Acute Risk Factors for OEVOJJK:09381829}. Protective factors for this patient include: {Protective Factors for Suicide HBZJ:69678938}. Considering these factors, the overall suicide risk at this point appears to be {Desc; low/moderate/high:110033}. Patient {ACTION; IS/IS BOF:75102585} appropriate for outpatient follow up.     Collaboration of Care: {BH OP Collaboration of Care:21014065}  Patient/Guardian was advised Release of Information must be obtained prior to any record release in order to collaborate their care with an outside provider. Patient/Guardian was advised if they have not already done so to contact the registration department to sign all necessary forms in order for [place] to release information regarding their care.   Consent: Patient/Guardian gives verbal consent for treatment and assignment of benefits for services provided during this visit. Patient/Guardian expressed understanding and agreed to proceed.   Norman Clay, MD 12/7/202311:36 AM

## 2022-12-01 ENCOUNTER — Emergency Department: Payer: Medicare Other

## 2022-12-01 ENCOUNTER — Inpatient Hospital Stay
Admission: EM | Admit: 2022-12-01 | Discharge: 2022-12-05 | DRG: 189 | Disposition: A | Discharge status: Home Health | Payer: Medicare Other | Attending: Obstetrics and Gynecology | Admitting: Obstetrics and Gynecology

## 2022-12-01 ENCOUNTER — Encounter: Payer: Self-pay | Admitting: Internal Medicine

## 2022-12-01 ENCOUNTER — Other Ambulatory Visit: Payer: Self-pay

## 2022-12-01 DIAGNOSIS — J9601 Acute respiratory failure with hypoxia: Principal | ICD-10-CM | POA: Diagnosis present

## 2022-12-01 DIAGNOSIS — F419 Anxiety disorder, unspecified: Secondary | ICD-10-CM | POA: Diagnosis present

## 2022-12-01 DIAGNOSIS — M62838 Other muscle spasm: Secondary | ICD-10-CM | POA: Diagnosis present

## 2022-12-01 DIAGNOSIS — I10 Essential (primary) hypertension: Secondary | ICD-10-CM | POA: Diagnosis present

## 2022-12-01 DIAGNOSIS — J441 Chronic obstructive pulmonary disease with (acute) exacerbation: Secondary | ICD-10-CM | POA: Diagnosis present

## 2022-12-01 DIAGNOSIS — Z720 Tobacco use: Secondary | ICD-10-CM | POA: Diagnosis present

## 2022-12-01 DIAGNOSIS — K5909 Other constipation: Secondary | ICD-10-CM | POA: Diagnosis present

## 2022-12-01 DIAGNOSIS — I2510 Atherosclerotic heart disease of native coronary artery without angina pectoris: Secondary | ICD-10-CM | POA: Diagnosis present

## 2022-12-01 DIAGNOSIS — J439 Emphysema, unspecified: Secondary | ICD-10-CM | POA: Diagnosis present

## 2022-12-01 DIAGNOSIS — I214 Non-ST elevation (NSTEMI) myocardial infarction: Secondary | ICD-10-CM | POA: Diagnosis present

## 2022-12-01 DIAGNOSIS — R0603 Acute respiratory distress: Secondary | ICD-10-CM

## 2022-12-01 DIAGNOSIS — R0602 Shortness of breath: Secondary | ICD-10-CM

## 2022-12-01 DIAGNOSIS — F411 Generalized anxiety disorder: Secondary | ICD-10-CM | POA: Diagnosis present

## 2022-12-01 DIAGNOSIS — Z888 Allergy status to other drugs, medicaments and biological substances status: Secondary | ICD-10-CM

## 2022-12-01 DIAGNOSIS — Z79899 Other long term (current) drug therapy: Secondary | ICD-10-CM

## 2022-12-01 DIAGNOSIS — I110 Hypertensive heart disease with heart failure: Secondary | ICD-10-CM | POA: Diagnosis present

## 2022-12-01 DIAGNOSIS — Z7951 Long term (current) use of inhaled steroids: Secondary | ICD-10-CM

## 2022-12-01 DIAGNOSIS — J449 Chronic obstructive pulmonary disease, unspecified: Secondary | ICD-10-CM | POA: Diagnosis present

## 2022-12-01 DIAGNOSIS — Z1152 Encounter for screening for COVID-19: Secondary | ICD-10-CM

## 2022-12-01 DIAGNOSIS — I5032 Chronic diastolic (congestive) heart failure: Secondary | ICD-10-CM | POA: Diagnosis present

## 2022-12-01 DIAGNOSIS — B20 Human immunodeficiency virus [HIV] disease: Secondary | ICD-10-CM | POA: Diagnosis present

## 2022-12-01 DIAGNOSIS — I252 Old myocardial infarction: Secondary | ICD-10-CM

## 2022-12-01 DIAGNOSIS — B1920 Unspecified viral hepatitis C without hepatic coma: Secondary | ICD-10-CM | POA: Diagnosis present

## 2022-12-01 DIAGNOSIS — R0902 Hypoxemia: Secondary | ICD-10-CM

## 2022-12-01 DIAGNOSIS — Z7982 Long term (current) use of aspirin: Secondary | ICD-10-CM

## 2022-12-01 DIAGNOSIS — Z21 Asymptomatic human immunodeficiency virus [HIV] infection status: Secondary | ICD-10-CM | POA: Diagnosis present

## 2022-12-01 DIAGNOSIS — F17210 Nicotine dependence, cigarettes, uncomplicated: Secondary | ICD-10-CM | POA: Diagnosis present

## 2022-12-01 DIAGNOSIS — G8929 Other chronic pain: Secondary | ICD-10-CM | POA: Diagnosis present

## 2022-12-01 HISTORY — DX: Chronic obstructive pulmonary disease, unspecified: J44.9

## 2022-12-01 LAB — CK: Total CK: 72 U/L (ref 49–397)

## 2022-12-01 LAB — CBC WITH DIFFERENTIAL/PLATELET
Abs Immature Granulocytes: 0.03 10*3/uL (ref 0.00–0.07)
Basophils Absolute: 0.1 10*3/uL (ref 0.0–0.1)
Basophils Relative: 0 %
Eosinophils Absolute: 0.1 10*3/uL (ref 0.0–0.5)
Eosinophils Relative: 1 %
HCT: 57.5 % — ABNORMAL HIGH (ref 39.0–52.0)
Hemoglobin: 19.1 g/dL — ABNORMAL HIGH (ref 13.0–17.0)
Immature Granulocytes: 0 %
Lymphocytes Relative: 12 %
Lymphs Abs: 1.4 10*3/uL (ref 0.7–4.0)
MCH: 35.9 pg — ABNORMAL HIGH (ref 26.0–34.0)
MCHC: 33.2 g/dL (ref 30.0–36.0)
MCV: 108.1 fL — ABNORMAL HIGH (ref 80.0–100.0)
Monocytes Absolute: 1 10*3/uL (ref 0.1–1.0)
Monocytes Relative: 8 %
Neutro Abs: 9.5 10*3/uL — ABNORMAL HIGH (ref 1.7–7.7)
Neutrophils Relative %: 79 %
Platelets: 218 10*3/uL (ref 150–400)
RBC: 5.32 MIL/uL (ref 4.22–5.81)
RDW: 12.2 % (ref 11.5–15.5)
WBC: 12.1 10*3/uL — ABNORMAL HIGH (ref 4.0–10.5)
nRBC: 0 % (ref 0.0–0.2)

## 2022-12-01 LAB — RESP PANEL BY RT-PCR (FLU A&B, COVID) ARPGX2
Influenza A by PCR: NEGATIVE
Influenza B by PCR: NEGATIVE
SARS Coronavirus 2 by RT PCR: NEGATIVE

## 2022-12-01 LAB — COMPREHENSIVE METABOLIC PANEL
ALT: 8 U/L (ref 0–44)
AST: 28 U/L (ref 15–41)
Albumin: 4.8 g/dL (ref 3.5–5.0)
Alkaline Phosphatase: 49 U/L (ref 38–126)
Anion gap: 11 (ref 5–15)
BUN: 13 mg/dL (ref 8–23)
CO2: 30 mmol/L (ref 22–32)
Calcium: 9.6 mg/dL (ref 8.9–10.3)
Chloride: 100 mmol/L (ref 98–111)
Creatinine, Ser: 0.96 mg/dL (ref 0.61–1.24)
GFR, Estimated: >60 mL/min (ref >60)
Glucose, Bld: 123 mg/dL — ABNORMAL HIGH (ref 70–99)
Potassium: 4.3 mmol/L (ref 3.5–5.1)
Sodium: 141 mmol/L (ref 135–145)
Total Bilirubin: 1 mg/dL (ref 0.3–1.2)
Total Protein: 9.3 g/dL — ABNORMAL HIGH (ref 6.5–8.1)

## 2022-12-01 LAB — T4, FREE: Free T4: 1 ng/dL (ref 0.61–1.12)

## 2022-12-01 LAB — LACTIC ACID, PLASMA
Lactic Acid, Venous: 1.8 mmol/L (ref 0.5–1.9)
Lactic Acid, Venous: 1.2 mmol/L (ref 0.5–1.9)
Lactic Acid, Venous: 0.9 mmol/L (ref 0.5–1.9)

## 2022-12-01 LAB — BRAIN NATRIURETIC PEPTIDE: B Natriuretic Peptide: 61.4 pg/mL (ref 0.0–100.0)

## 2022-12-01 LAB — TROPONIN I (HIGH SENSITIVITY)
Troponin I (High Sensitivity): 50 ng/L — ABNORMAL HIGH (ref <18)
Troponin I (High Sensitivity): 103 ng/L (ref <18)
Troponin I (High Sensitivity): 84 ng/L — ABNORMAL HIGH (ref <18)

## 2022-12-01 LAB — TSH: TSH: 0.495 u[IU]/mL (ref 0.350–4.500)

## 2022-12-01 MED ORDER — IPRATROPIUM-ALBUTEROL 0.5-2.5 (3) MG/3ML IN SOLN
3.0000 mL | Freq: Once | RESPIRATORY_TRACT | Status: AC
  Administered 2022-12-01: 3 mL via RESPIRATORY_TRACT
  Filled 2022-12-01: qty 9

## 2022-12-01 MED ORDER — MORPHINE SULFATE ER 15 MG PO TBCR
30.0000 mg | EXTENDED_RELEASE_TABLET | Freq: Two times a day (BID) | ORAL | Status: DC
  Administered 2022-12-02 – 2022-12-05 (×7): 30 mg via ORAL
  Filled 2022-12-01 (×7): qty 2

## 2022-12-01 MED ORDER — ABACAVIR-DOLUTEGRAVIR-LAMIVUD 600-50-300 MG PO TABS
1.0000 | ORAL_TABLET | Freq: Every day | ORAL | Status: DC
  Administered 2022-12-02 – 2022-12-05 (×4): 1 via ORAL
  Filled 2022-12-01 (×5): qty 1

## 2022-12-01 MED ORDER — IPRATROPIUM-ALBUTEROL 0.5-2.5 (3) MG/3ML IN SOLN
3.0000 mL | Freq: Once | RESPIRATORY_TRACT | Status: AC
  Administered 2022-12-01: 3 mL via RESPIRATORY_TRACT
  Filled 2022-12-01: qty 3

## 2022-12-01 MED ORDER — BACLOFEN 10 MG PO TABS
10.0000 mg | ORAL_TABLET | Freq: Every day | ORAL | Status: DC
  Administered 2022-12-02 – 2022-12-05 (×4): 10 mg via ORAL
  Filled 2022-12-01 (×4): qty 1

## 2022-12-01 MED ORDER — ACETAMINOPHEN 325 MG PO TABS: 650.0000 mg | ORAL_TABLET | Freq: Four times a day (QID) | ORAL | Status: DC | PRN

## 2022-12-01 MED ORDER — ALBUTEROL SULFATE (2.5 MG/3ML) 0.083% IN NEBU: 2.5000 mg | INHALATION_SOLUTION | Freq: Four times a day (QID) | RESPIRATORY_TRACT | Status: DC | PRN

## 2022-12-01 MED ORDER — SODIUM CHLORIDE 0.9% FLUSH
3.0000 mL | Freq: Two times a day (BID) | INTRAVENOUS | Status: DC
  Administered 2022-12-01 – 2022-12-05 (×8): 3 mL via INTRAVENOUS

## 2022-12-01 MED ORDER — PANTOPRAZOLE SODIUM 40 MG IV SOLR
40.0000 mg | Freq: Two times a day (BID) | INTRAVENOUS | Status: DC
  Administered 2022-12-01 – 2022-12-02 (×2): 40 mg via INTRAVENOUS
  Filled 2022-12-01 (×2): qty 10

## 2022-12-01 MED ORDER — METHYLPREDNISOLONE SODIUM SUCC 40 MG IJ SOLR
40.0000 mg | Freq: Two times a day (BID) | INTRAMUSCULAR | Status: AC
  Administered 2022-12-02 (×2): 40 mg via INTRAVENOUS
  Filled 2022-12-01 (×2): qty 1

## 2022-12-01 MED ORDER — IPRATROPIUM-ALBUTEROL 0.5-2.5 (3) MG/3ML IN SOLN
3.0000 mL | Freq: Once | RESPIRATORY_TRACT | Status: AC
  Administered 2022-12-01: 3 mL via RESPIRATORY_TRACT

## 2022-12-01 MED ORDER — IPRATROPIUM-ALBUTEROL 0.5-2.5 (3) MG/3ML IN SOLN
3.0000 mL | Freq: Four times a day (QID) | RESPIRATORY_TRACT | Status: DC
  Administered 2022-12-02: 3 mL via RESPIRATORY_TRACT
  Filled 2022-12-01 (×3): qty 3

## 2022-12-01 MED ORDER — ASPIRIN 81 MG PO TBEC
81.0000 mg | DELAYED_RELEASE_TABLET | Freq: Every day | ORAL | Status: DC
  Administered 2022-12-02 – 2022-12-05 (×4): 81 mg via ORAL
  Filled 2022-12-01 (×4): qty 1

## 2022-12-01 MED ORDER — HEPARIN SODIUM (PORCINE) 5000 UNIT/ML IJ SOLN
5000.0000 [IU] | Freq: Two times a day (BID) | INTRAMUSCULAR | Status: DC
  Administered 2022-12-01: 5000 [IU] via SUBCUTANEOUS
  Filled 2022-12-01 (×2): qty 1

## 2022-12-01 MED ORDER — ACETAMINOPHEN 650 MG RE SUPP: 650.0000 mg | Freq: Four times a day (QID) | RECTAL | Status: DC | PRN

## 2022-12-01 MED ORDER — METHYLPREDNISOLONE SODIUM SUCC 125 MG IJ SOLR
125.0000 mg | Freq: Once | INTRAMUSCULAR | Status: AC
  Administered 2022-12-01: 125 mg via INTRAVENOUS
  Filled 2022-12-01: qty 2

## 2022-12-01 MED ORDER — ROSUVASTATIN CALCIUM 10 MG PO TABS
20.0000 mg | ORAL_TABLET | Freq: Every day | ORAL | Status: DC
  Administered 2022-12-02 – 2022-12-05 (×4): 20 mg via ORAL
  Filled 2022-12-01: qty 2
  Filled 2022-12-01: qty 1
  Filled 2022-12-01 (×2): qty 2

## 2022-12-01 NOTE — ED Notes (Signed)
Communicated with admitting MD Dr. Posey Pronto informed increased in trop from 32 to 103. Pt denies CP. Per MD continue to trend trop q2h. Orders placed via verbal with read back

## 2022-12-01 NOTE — ED Notes (Signed)
Report received by previous RN. Pt is currently in ED awaiting admission. Pt initial c/o SOB. Pt SOB has now improved. Pt not on home oxygen. Was found on 4L nasal cannula. O2 decreased to 2 L, SpO2 remains 93-94%. Pt unlabored at rest. Pt remains on VS monitor. Call bell within reach.

## 2022-12-01 NOTE — ED Provider Notes (Signed)
[facility] Provider Note    Event Date/Time   First MD Initiated Contact with Patient 12/01/22 1554     (approximate)   History   Shortness of Breath   HPI  Albert Wong is a 66 y.o. male who presented to the emergency room with shortness of breath.  Patient states shortness of breath for a long time things did get worse about 5 months ago when he had a heart attack.  Over the past few days it has gotten acutely worse.  He has woken up in the [place] feeling short of breath.  Patient came in today because he is not getting any relief from his nebulizer or inhaler treatments.  He denies any associated chest pain. Denies any known sick contacts.  Denies any fevers.     Physical Exam   Triage Vital Signs: ED Triage Vitals [12/01/22 1547]  Enc Vitals Group     BP      Pulse Rate (!) 141     Resp (!) 30     Temp      Temp src      SpO2 (!) 32 %     Weight 141 lb 1.5 oz (64 kg)     Height '5\' 10"'$  (1.778 m)     Head Circumference      Peak Flow      Pain Score 0     Pain Loc      Pain Edu?      Excl. in [place]?     Most recent vital signs: Vitals:   12/01/22 1547  Pulse: (!) 141  Resp: (!) 30  SpO2: (!) 32%   General: Awake, alert, oriented. CV:  Good peripheral perfusion. Tachycardia Resp:  Increased respiratory effort and rate. Poor air movement diffusely. Abd:  No distention.     ED Results / Procedures / Treatments   Labs (all labs ordered are listed, but only abnormal results are displayed) Labs Reviewed - No data to display   EKG  I, Nance Pear, attending physician, personally viewed and interpreted this EKG  EKG Time: 1605 Rate: 127 Rhythm: sinus tachycarfdia Axis: normal Intervals: qtc 454 QRS: narrow, q waves v1, v2, v3 ST changes: no st elevation Impression: abnormal ekg   RADIOLOGY I independently interpreted and visualized the CXR. My interpretation: No pneumonia Radiology interpretation:  IMPRESSION:   Advanced chronic emphysema. No acute finding.      PROCEDURES:  Critical Care performed: Yes, see critical care procedure note(s)  Procedures  CRITICAL CARE Performed by: Nance Pear   Total critical care time: 35 minutes  Critical care time was exclusive of separately billable procedures and treating other patients.  Critical care was necessary to treat or prevent imminent or life-threatening deterioration.  Critical care was time spent personally by me on the following activities: development of treatment plan with patient and/or surrogate as well as nursing, discussions with consultants, evaluation of patient's response to treatment, examination of patient, obtaining history from patient or surrogate, ordering and performing treatments and interventions, ordering and review of laboratory studies, ordering and review of radiographic studies, pulse oximetry and re-evaluation of patient's condition.   MEDICATIONS ORDERED IN ED: Medications - No data to display   IMPRESSION / MDM / [place] / ED COURSE  I reviewed the triage vital signs and the nursing notes.  Differential diagnosis includes, but is not limited to, pneumonia, COVID, COPD, CHF, ACS, anemia.  Patient's presentation is most consistent with acute presentation with potential threat to life or bodily function.  The patient is on the cardiac monitor to evaluate for evidence of arrhythmia and/or significant heart rate changes.  Patient presented to the emergency department today because of concern for shortness of breath. On exam patient does have increased work of breathing and tachypnea. Poor air movement diffusely.  Patient was placed on nonrebreather.  Patient was given Solu-Medrol as well as multiple breathing treatments.  Workup with x-ray negative for pneumonia or pneumothorax.  Patient did have a slight elevation of the troponin however given history unclear if this  is patient's baseline.  Given lack of chest pain I have a low concern for ACS.  Patient did improve after medication.  Patient was able to be placed on nasal cannula.  Discussed with Dr. Posey Pronto with the hospitalist service who will plan on admission.     FINAL CLINICAL IMPRESSION(S) / ED DIAGNOSES   Final diagnoses:  COPD exacerbation ([place])  Hypoxia  SOB (shortness of breath)     Note:  This document was prepared using Dragon voice recognition software and may include unintentional dictation errors.    Nance Pear, MD 12/01/22 [phone]

## 2022-12-01 NOTE — H&P (Incomplete)
History and Physical    Chief Complaint: SOB.    HISTORY OF PRESENT ILLNESS: Albert Wong is an 66 y.o. male  with c/o SOB that was worse when he woke up from a nap.  In respiratory distress on arrival  with hypoxia. Pt has been sob off and on since about 5 months ago when he had an NSTEMI. Today he used his nebs and inhalers but his sob did not improve.  Initial vitals shows o2 at 32% on pulse ox and tach at 141 and RR at 30.     Pt has  Past Medical History:  Diagnosis Date  . Anxiety   . Chronic nausea   . Degenerative disk disease   . HIV (human immunodeficiency virus infection) ([place])   . HIV (human immunodeficiency virus infection) ([place])   . Insomnia   . Muscle spasm   . Rotator cuff tear      Review of Systems  Constitutional:  Positive for fatigue.  Respiratory:  Positive for shortness of breath and wheezing.   All other systems reviewed and are negative.  Allergies  Allergen Reactions  . Dovato [Dolutegravir-Lamivudine] Other (See Comments)    Severe Flu like symptoms Sweating  Shortness of breath Body aches-- Doubt this is Dovato allergy as he is tolerating truimeq which is dovato without abacavir  . Entresto [Sacubitril-Valsartan] Photosensitivity    Dizziness  . Metoprolol Succinate [Metoprolol] Photosensitivity    Patients reported change in vision. D/c by pt pcp   Past Surgical History:  Procedure Laterality Date  . dental procedure N/A   . LEFT HEART CATH AND CORONARY ANGIOGRAPHY N/A 09/16/2021   Procedure: LEFT HEART CATH AND CORONARY ANGIOGRAPHY;  Surgeon: Wellington Hampshire, MD;  Location: [facility];  Service: Cardiovascular;  Laterality: N/A;       MEDICATIONS: Current Outpatient Medications  Medication Instructions  . albuterol (PROVENTIL) (2.5 MG/3ML) 0.083% nebulizer solution Nebulization  . aspirin EC 81 mg, Oral, Daily, Swallow whole.  . B-D 3CC LUER-LOK SYR 21GX1" 21G X 1" 3 ML MISC USE AS DIRECTED WITH TESTOSTERONE  .  baclofen (LIORESAL) 10 MG tablet TAKE 1 TABLET BY MOUTH EVERY DAY  . BREZTRI AEROSPHERE 160-9-4.8 MCG/ACT AERO 2 puffs, Inhalation, 2 times daily  . budesonide-formoterol (SYMBICORT) 80-4.5 MCG/ACT inhaler 2 puffs, Inhalation, 2 times daily PRN  . cromolyn (OPTICROM) 4 % ophthalmic solution SMARTSIG:1-2 Drop(s) In Eye(s) 4-6 Times Daily  . dapagliflozin propanediol (FARXIGA) 10 MG TABS tablet TAKE 1 TABLET BY MOUTH EVERY EVENING  . diazepam (VALIUM) 10 mg, Oral, Every 6 hours PRN  . dicyclomine (BENTYL) 20 mg, Oral, 2 times daily  . fluticasone (FLONASE) 50 MCG/ACT nasal spray Each Nare  . Ipratropium-Albuterol (COMBIVENT) 20-100 MCG/ACT AERS respimat Inhalation, As directed  . ketoconazole (NIZORAL) 2 % cream 1 application , Topical, Daily PRN  . losartan (COZAAR) 25 mg, Oral, Daily  . lubiprostone (AMITIZA) 24 mcg, Oral, 2 times daily  . Melatonin 5 MG CAPS 2 capsules, Oral, Daily at bedtime  . morphine (MS CONTIN) 30 mg, Oral, 2 times daily  . rosuvastatin (CRESTOR) 20 mg, Oral, Daily  . spironolactone (ALDACTONE) 12.5 mg, Oral, Daily  . tamsulosin (FLOMAX) 0.4 MG CAPS capsule TAKE 1 CAPSULE BY MOUTH ONCE DAILY  . triamcinolone cream (KENALOG) 0.1 % 1 Application, Topical, 3 times daily  . TRIUMEQ 600-50-300 MG tablet 1 tablet, Oral, Daily  . VITAMIN D PO 1,000 Units, Oral, Daily  ED Course: Pt in Ed *** Vitals:   12/01/22 1800 12/01/22 1830 12/01/22 1900 12/01/22 1915  BP: 137/69 123/72 117/72   Pulse: (!) 106 99 98 94  Resp: '19 10 15 18  '$ Temp:      TempSrc:      SpO2: 98% 98% 94% 94%  Weight:      Height:        No intake/output data recorded. SpO2: 94 % O2 Flow Rate (L/min): 4 L/min FiO2 (%): 40 % Blood work in ed shows: Results for orders placed or performed during the hospital encounter of 12/01/22 (from the past 24 hour(s))  Comprehensive metabolic panel     Status: Abnormal   Collection Time: 12/01/22  3:57 PM  Result Value Ref Range   Sodium 141  135 - 145 mmol/L   Potassium 4.3 3.5 - 5.1 mmol/L   Chloride 100 98 - 111 mmol/L   CO2 30 22 - 32 mmol/L   Glucose, Bld 123 (H) 70 - 99 mg/dL   BUN 13 8 - 23 mg/dL   Creatinine, Ser 0.96 0.61 - 1.24 mg/dL   Calcium 9.6 8.9 - 10.3 mg/dL   Total Protein 9.3 (H) 6.5 - 8.1 g/dL   Albumin 4.8 3.5 - 5.0 g/dL   AST 28 15 - 41 U/L   ALT 8 0 - 44 U/L   Alkaline Phosphatase 49 38 - 126 U/L   Total Bilirubin 1.0 0.3 - 1.2 mg/dL   GFR, Estimated >60 >60 mL/min   Anion gap 11 5 - 15  CBC with Differential     Status: Abnormal   Collection Time: 12/01/22  3:57 PM  Result Value Ref Range   WBC 12.1 (H) 4.0 - 10.5 K/uL   RBC 5.32 4.22 - 5.81 MIL/uL   Hemoglobin 19.1 (H) 13.0 - 17.0 g/dL   HCT 57.5 (H) 39.0 - 52.0 %   MCV 108.1 (H) 80.0 - 100.0 fL   MCH 35.9 (H) 26.0 - 34.0 pg   MCHC 33.2 30.0 - 36.0 g/dL   RDW 12.2 11.5 - 15.5 %   Platelets 218 150 - 400 K/uL   nRBC 0.0 0.0 - 0.2 %   Neutrophils Relative % 79 %   Neutro Abs 9.5 (H) 1.7 - 7.7 K/uL   Lymphocytes Relative 12 %   Lymphs Abs 1.4 0.7 - 4.0 K/uL   Monocytes Relative 8 %   Monocytes Absolute 1.0 0.1 - 1.0 K/uL   Eosinophils Relative 1 %   Eosinophils Absolute 0.1 0.0 - 0.5 K/uL   Basophils Relative 0 %   Basophils Absolute 0.1 0.0 - 0.1 K/uL   Immature Granulocytes 0 %   Abs Immature Granulocytes 0.03 0.00 - 0.07 K/uL  Resp Panel by RT-PCR (Flu A&B, Covid) Anterior Nasal Swab     Status: None   Collection Time: 12/01/22  3:57 PM   Specimen: Anterior Nasal Swab  Result Value Ref Range   SARS Coronavirus 2 by RT PCR NEGATIVE NEGATIVE   Influenza A by PCR NEGATIVE NEGATIVE   Influenza B by PCR NEGATIVE NEGATIVE  Troponin I (High Sensitivity)     Status: Abnormal   Collection Time: 12/01/22  3:57 PM  Result Value Ref Range   Troponin I (High Sensitivity) 50 (H) <18 ng/L  Lactic acid, plasma     Status: None   Collection Time: 12/01/22  4:29 PM  Result Value Ref Range   Lactic Acid, Venous 1.8 0.5 - 1.9 mmol/L  Unresulted Labs (From admission, onward)     Start     Ordered   12/02/22 0500  Comprehensive metabolic panel  Tomorrow morning,   STAT        12/01/22 1927   12/02/22 0500  CBC  Tomorrow morning,   STAT        12/01/22 1927   12/01/22 1927  CK  Add-on,   AD        12/01/22 1927   12/01/22 1927  Lactic acid, plasma  STAT Now then every 3 hours,   STAT      12/01/22 1927   12/01/22 1925  T4, free  Once,   URGENT        12/01/22 1927   12/01/22 1924  Vitamin B12  Add-on,   AD        12/01/22 1927   12/01/22 1924  TSH  Add-on,   AD        12/01/22 1927   12/01/22 1921  Brain natriuretic peptide  Once,   URGENT        12/01/22 1921   12/01/22 1921  Procalcitonin - Baseline  ONCE - URGENT,   URGENT        12/01/22 1921   12/01/22 1557  Lactic acid, plasma  (Undifferentiated presentation (screening labs and basic nursing orders))  Now then every 2 hours,   STAT      12/01/22 1557   12/01/22 1557  Blood Culture (routine x 2)  (Undifferentiated presentation (screening labs and basic nursing orders))  BLOOD CULTURE X 2,   STAT      12/01/22 1557   12/01/22 1557  Urinalysis, Complete w Microscopic  (Undifferentiated presentation (screening labs and basic nursing orders))  ONCE - URGENT,   URGENT        12/01/22 1557           Pt has received : Orders Placed This Encounter  Procedures  . Blood Culture (routine x 2)    Standing Status:   Standing    Number of Occurrences:   2  . Resp Panel by RT-PCR (Flu A&B, Covid) Anterior Nasal Swab    Standing Status:   Standing    Number of Occurrences:   1  . DG Chest Port 1 View    Standing Status:   Standing    Number of Occurrences:   1    Order Specific Question:   Reason for Exam (SYMPTOM  OR DIAGNOSIS REQUIRED)    Answer:   Questionable sepsis - evaluate for abnormality  . Lactic acid, plasma    Standing Status:   Standing    Number of Occurrences:   2  . Comprehensive metabolic panel    Standing Status:   Standing    Number  of Occurrences:   1  . CBC with Differential    Standing Status:   Standing    Number of Occurrences:   1  . Urinalysis, Complete w Microscopic    Standing Status:   Standing    Number of Occurrences:   1  . Brain natriuretic peptide    Standing Status:   Standing    Number of Occurrences:   1  . Procalcitonin - Baseline    Standing Status:   Standing    Number of Occurrences:   1  . Vitamin B12    Standing Status:   Standing    Number of Occurrences:   1  . TSH    Standing  Status:   Standing    Number of Occurrences:   1  . T4, free    Standing Status:   Standing    Number of Occurrences:   1  . Comprehensive metabolic panel    Standing Status:   Standing    Number of Occurrences:   1  . CBC    Standing Status:   Standing    Number of Occurrences:   1  . CK    Standing Status:   Standing    Number of Occurrences:   1  . Lactic acid, plasma    Standing Status:   Standing    Number of Occurrences:   2  . Diet NPO time specified    Standing Status:   Standing    Number of Occurrences:   1  . Cardiac monitoring    Standing Status:   Standing    Number of Occurrences:   1  . Document height and weight    Standing Status:   Standing    Number of Occurrences:   1  . Assess and Document Glasgow Coma Scale    Standing Status:   Standing    Number of Occurrences:   1  . Initiate Carrier Fluid Protocol    Standing Status:   Standing    Number of Occurrences:   1  . Swallow screen    Standing Status:   Standing    Number of Occurrences:   1  . Maintain IV access    Standing Status:   Standing    Number of Occurrences:   1  . Vital signs    Standing Status:   Standing    Number of Occurrences:   1  . Notify physician (specify)    Standing Status:   Standing    Number of Occurrences:   20    Order Specific Question:   Notify Physician    Answer:   for pulse less than 55 or greater than 120    Order Specific Question:   Notify Physician    Answer:   for respiratory  rate less than 12 or greater than 25    Order Specific Question:   Notify Physician    Answer:   for temperature greater than 100.5 F    Order Specific Question:   Notify Physician    Answer:   for urinary output less than 30 mL/hr for four hours    Order Specific Question:   Notify Physician    Answer:   for systolic BP less than 90 or greater than 810, diastolic BP less than 60 or greater than 100    Order Specific Question:   Notify Physician    Answer:   for new hypoxia w/ oxygen saturations < 88%  . Progressive Mobility Protocol: No Restrictions    Standing Status:   Standing    Number of Occurrences:   1  . Daily weights    Standing Status:   Standing    Number of Occurrences:   1  . Intake and Output    Standing Status:   Standing    Number of Occurrences:   1  . Initiate Oral Care Protocol    Standing Status:   Standing    Number of Occurrences:   1  . Initiate Carrier Fluid Protocol    Standing Status:   Standing    Number of Occurrences:   1  . RN may order General Admission PRN Orders  utilizing "General Admission PRN medications" (through manage orders) for the following patient needs: allergy symptoms (Claritin), cold sores (Carmex), cough (Robitussin DM), eye irritation (Liquifilm Tears), hemorrhoids (Tucks), indigestion (Maalox), minor skin irritation (Hydrocortisone Cream), muscle pain Suezanne Jacquet Gay), nose irritation (saline nasal spray) and sore throat (Chloraseptic spray).    Standing Status:   Standing    Number of Occurrences:   [id number]  . Cardiac Monitoring Continuous x 48 hours Indications for use: Other; Other indications for use: respiratory distress    Standing Status:   Standing    Number of Occurrences:   1    Order Specific Question:   Indications for use:    Answer:   Other    Order Specific Question:   Other indications for use:    Answer:   respiratory distress  . Full code    Standing Status:   Standing    Number of Occurrences:   1  . Consult to  hospitalist    Standing Status:   Standing    Number of Occurrences:   1    Order Specific Question:   Place call to:    Answer:   hospitalist    Order Specific Question:   Reason for Consult    Answer:   Admit    Order Specific Question:   Diagnosis/Clinical Info for Consult:    Answer:   COPD  . Airborne and Contact precautions    Standing Status:   Standing    Number of Occurrences:   1  . Pulse oximetry, continuous    Standing Status:   Standing    Number of Occurrences:   1  . Pulse oximetry check with vital signs    Standing Status:   Standing    Number of Occurrences:   1  . Oxygen therapy Mode or (Route): Nasal cannula; Liters Per Minute: 2; Keep 02 saturation: greater than 92 %    Standing Status:   Standing    Number of Occurrences:   20    Order Specific Question:   Mode or (Route)    Answer:   Nasal cannula    Order Specific Question:   Liters Per Minute    Answer:   2    Order Specific Question:   Keep 02 saturation    Answer:   greater than 92 %  . ED EKG 12-Lead    Standing Status:   Standing    Number of Occurrences:   1    Order Specific Question:   Notes    Answer:   Baseline  . EKG 12-Lead    Standing Status:   Standing    Number of Occurrences:   1  . EKG 12-Lead    Standing Status:   Standing    Number of Occurrences:   1  . Place in observation (patient's expected length of stay will be less than 2 midnights)    Standing Status:   Standing    Number of Occurrences:   1    Order Specific Question:   Hospital Area    Answer:   [place] [[id number]]    Order Specific Question:   Level of Care    Answer:   Progressive [102]    Order Specific Question:   Admit to Progressive based on following criteria    Answer:   RESPIRATORY PROBLEMS hypoxemic/hypercapnic respiratory failure that is responsive to NIPPV (BiPAP) or High Flow Nasal Cannula (6-80 lpm). Frequent assessment/intervention, no > Q2  hrs < Q4 hrs, to maintain oxygenation and  pulmonary hygiene.    Order Specific Question:   Covid Evaluation    Answer:   Asymptomatic - no recent exposure (last 10 days) testing not required    Order Specific Question:   Diagnosis    Answer:   Respiratory distress [793903]    Order Specific Question:   Admitting Physician    Answer:   Cherylann Ratel    Order Specific Question:   Attending Physician    Answer:   Cherylann Ratel  . Aspiration precautions    Standing Status:   Standing    Number of Occurrences:   1  . Fall precautions    Standing Status:   Standing    Number of Occurrences:   1    Meds ordered this encounter  Medications  . ipratropium-albuterol (DUONEB) 0.5-2.5 (3) MG/3ML nebulizer solution 3 mL  . ipratropium-albuterol (DUONEB) 0.5-2.5 (3) MG/3ML nebulizer solution 3 mL  . ipratropium-albuterol (DUONEB) 0.5-2.5 (3) MG/3ML nebulizer solution 3 mL  . methylPREDNISolone sodium succinate (SOLU-MEDROL) 125 mg/2 mL injection 125 mg    IV methylprednisolone will be converted to either a q12h or q24h frequency with the same total daily dose (TDD).  Ordered Dose: 1 to 125 mg TDD; convert to: TDD q24h.  Ordered Dose: 126 to 250 mg TDD; convert to: TDD div q12h.  Ordered Dose: >250 mg TDD; DAW.  . baclofen (LIORESAL) tablet 10 mg  . aspirin EC tablet 81 mg    Swallow whole.    . albuterol (PROVENTIL) (2.5 MG/3ML) 0.083% nebulizer solution 2.5 mg  . Ipratropium-Albuterol (COMBIVENT) respimat 1 puff  . morphine (MS CONTIN) 12 hr tablet 30 mg  . rosuvastatin (CRESTOR) tablet 20 mg  . abacavir-dolutegravir-lamiVUDine (TRIUMEQ) 600-50-300 MG per tablet 1 tablet  . pantoprazole (PROTONIX) injection 40 mg  . heparin injection 5,000 Units  . sodium chloride flush (NS) 0.9 % injection 3 mL  . OR Linked Order Group   . acetaminophen (TYLENOL) tablet 650 mg   . acetaminophen (TYLENOL) suppository 650 mg  . methylPREDNISolone sodium succinate (SOLU-MEDROL) 40 mg/mL injection 40 mg    IV methylprednisolone  will be converted to either a q12h or q24h frequency with the same total daily dose (TDD).  Ordered Dose: 1 to 125 mg TDD; convert to: TDD q24h.  Ordered Dose: 126 to 250 mg TDD; convert to: TDD div q12h.  Ordered Dose: >250 mg TDD; DAW.     Admission Imaging : DG Chest Port 1 View  Result Date: 12/01/2022 CLINICAL DATA:  Shortness of breath.  Sepsis. EXAM: PORTABLE CHEST 1 VIEW COMPARISON:  09/16/2021 FINDINGS: Heart size is normal. Chronic aortic atherosclerotic calcification. Advanced chronic emphysema. No sign of consolidation or collapse. No edema or effusion. IMPRESSION: Advanced chronic emphysema. No acute finding. Electronically Signed   By: Nelson Chimes M.D.   On: 12/01/2022 16:16      Physical Examination: Vitals:   12/01/22 1800 12/01/22 1830 12/01/22 1900 12/01/22 1915  BP: 137/69 123/72 117/72   Pulse: (!) 106 99 98 94  Temp:      Resp: '19 10 15 18  '$ Height:      Weight:      SpO2: 98% 98% 94% 94%  TempSrc:      BMI (Calculated):       Physical Exam Vitals and nursing note reviewed.  Constitutional:      General: He is not in acute distress.    Appearance:  Normal appearance. He is not ill-appearing, toxic-appearing or diaphoretic.  HENT:     Head: Normocephalic and atraumatic.     Right Ear: Hearing and external ear normal.     Left Ear: Hearing and external ear normal.     Nose: Nose normal. No nasal deformity.     Mouth/Throat:     Lips: Pink.     Mouth: Mucous membranes are moist.     Tongue: No lesions.     Pharynx: Oropharynx is clear.  Eyes:     Extraocular Movements: Extraocular movements intact.     Pupils: Pupils are equal, round, and reactive to light.  Neck:     Vascular: No carotid bruit.  Cardiovascular:     Rate and Rhythm: Normal rate and regular rhythm.     Pulses: Normal pulses.     Heart sounds: Normal heart sounds.  Pulmonary:     Effort: Pulmonary effort is normal.     Breath sounds: Wheezing present.  Abdominal:     General:  Bowel sounds are normal. There is no distension.     Palpations: Abdomen is soft. There is no mass.     Tenderness: There is no abdominal tenderness. There is no guarding.     Hernia: No hernia is present.  Musculoskeletal:     Right lower leg: No edema.     Left lower leg: No edema.  Skin:    General: Skin is warm.  Neurological:     General: No focal deficit present.     Mental Status: He is alert and oriented to person, place, and time.     Cranial Nerves: Cranial nerves 2-12 are intact.     Motor: Motor function is intact.  Psychiatric:        Attention and Perception: Attention normal.        Mood and Affect: Mood normal.        Speech: Speech normal.        Behavior: Behavior normal. Behavior is cooperative.        Cognition and Memory: Cognition normal.       Assessment and Plan: No notes have been filed under this hospital service. Service: Hospitalist          DVT prophylaxis:  ***  Code Status:  ***  Family Communication:  ***  Disposition Plan:  ***  Consults called:  *** Admission status: ***  Unit/ Expected LOS: ***   Para Skeans MD [facility]  6 PM- 2 AM. Please contact me via secure Chat 6 PM-2 AM. [phone]( Pager ) To contact the [facility] Attending or Consulting provider [place] or covering provider during after hours [place], for this patient.   Check the care team in [facility] and look for a) attending/consulting [facility] provider listed and b) the [facility] team listed Log into [email or web address] and use [place]'s universal password to access. If you do not have the password, please contact the hospital operator. Locate the [facility] provider you are looking for under [facility] and page to a number that you can be directly reached. If you still have difficulty reaching the provider, please page the [facility] (Director on Call) for the Hospitalists listed on amion for assistance. [email or web address] 12/01/2022, 7:27 PM

## 2022-12-01 NOTE — ED Notes (Signed)
Discussed third trop 84 with MD Posey Pronto. Verbal order to DC order received.

## 2022-12-01 NOTE — H&P (Signed)
History and Physical    Chief Complaint: SOB.    HISTORY OF PRESENT ILLNESS: Albert Wong is an 66 y.o. male  with c/o SOB that was worse when he woke up from a nap.  In respiratory distress on arrival  with hypoxia. Pt has been sob off and on since about 5 months ago when he had an NSTEMI. Today he used his nebs and inhalers but his sob did not improve.  Initial vitals shows o2 at 32% on pulse ox and tach at 141 and RR at 30.  Pt has  Past Medical History:  Diagnosis Date   Anxiety    Chronic nausea    COPD (chronic obstructive pulmonary disease) (HCC)    Degenerative disk disease    HIV (human immunodeficiency virus infection) ([place])    HIV (human immunodeficiency virus infection) ([place])    Insomnia    Muscle spasm    Rotator cuff tear     Review of Systems  Constitutional:  Positive for fatigue.  Respiratory:  Positive for shortness of breath and wheezing.   All other systems reviewed and are negative.  Allergies  Allergen Reactions   Dovato [Dolutegravir-Lamivudine] Other (See Comments)    Severe Flu like symptoms Sweating  Shortness of breath Body aches-- Doubt this is Dovato allergy as he is tolerating truimeq which is dovato without abacavir   Entresto [Sacubitril-Valsartan] Photosensitivity    Dizziness   Metoprolol Succinate [Metoprolol] Photosensitivity    Patients reported change in vision. D/c by pt pcp   Past Surgical History:  Procedure Laterality Date   dental procedure N/A    LEFT HEART CATH AND CORONARY ANGIOGRAPHY N/A 09/16/2021   Procedure: LEFT HEART CATH AND CORONARY ANGIOGRAPHY;  Surgeon: Wellington Hampshire, MD;  Location: [facility];  Service: Cardiovascular;  Laterality: N/A;       MEDICATIONS: Current Outpatient Medications  Medication Instructions   albuterol (PROVENTIL) (2.5 MG/3ML) 0.083% nebulizer solution Nebulization   aspirin EC 81 mg, Oral, Daily, Swallow whole.   B-D 3CC LUER-LOK SYR 21GX1" 21G X 1" 3 ML MISC USE AS  DIRECTED WITH TESTOSTERONE   baclofen (LIORESAL) 10 MG tablet TAKE 1 TABLET BY MOUTH EVERY DAY   BREZTRI AEROSPHERE 160-9-4.8 MCG/ACT AERO 2 puffs, Inhalation, 2 times daily   budesonide-formoterol (SYMBICORT) 80-4.5 MCG/ACT inhaler 2 puffs, Inhalation, 2 times daily PRN   cromolyn (OPTICROM) 4 % ophthalmic solution SMARTSIG:1-2 Drop(s) In Eye(s) 4-6 Times Daily   dapagliflozin propanediol (FARXIGA) 10 MG TABS tablet TAKE 1 TABLET BY MOUTH EVERY EVENING   diazepam (VALIUM) 10 mg, Oral, Every 6 hours PRN   dicyclomine (BENTYL) 20 mg, Oral, 2 times daily   fluticasone (FLONASE) 50 MCG/ACT nasal spray Each Nare   Ipratropium-Albuterol (COMBIVENT) 20-100 MCG/ACT AERS respimat Inhalation, As directed   ketoconazole (NIZORAL) 2 % cream 1 application , Topical, Daily PRN   losartan (COZAAR) 25 mg, Oral, Daily   lubiprostone (AMITIZA) 24 mcg, Oral, 2 times daily   Melatonin 5 MG CAPS 2 capsules, Oral, Daily at bedtime   morphine (MS CONTIN) 30 mg, Oral, 2 times daily   rosuvastatin (CRESTOR) 20 mg, Oral, Daily   spironolactone (ALDACTONE) 12.5 mg, Oral, Daily   tamsulosin (FLOMAX) 0.4 MG CAPS capsule TAKE 1 CAPSULE BY MOUTH ONCE DAILY   triamcinolone cream (KENALOG) 0.1 % 1 Application, Topical, 3 times daily   TRIUMEQ 600-50-300 MG tablet 1 tablet, Oral, Daily   VITAMIN D PO 1,000 Units, Oral, Daily    ED  Course: Pt in Ed is alert and oriented.  Vitals:   12/01/22 2215 12/01/22 2230 12/01/22 2245 12/01/22 2341  BP:      Pulse: 89 88 86   Resp: '20 19 20   '$ Temp:    (!) 97.4 F (36.3 C)  TempSrc:    Axillary  SpO2: 97% 97% 97%   Weight:      Height:       No intake/output data recorded. SpO2: 97 % O2 Flow Rate (L/min): 2 L/min FiO2 (%): 40 % Blood work in ed shows: Results for orders placed or performed during the hospital encounter of 12/01/22 (from the past 24 hour(s))  Comprehensive metabolic panel     Status: Abnormal   Collection Time: 12/01/22  3:57 PM  Result Value Ref Range    Sodium 141 135 - 145 mmol/L   Potassium 4.3 3.5 - 5.1 mmol/L   Chloride 100 98 - 111 mmol/L   CO2 30 22 - 32 mmol/L   Glucose, Bld 123 (H) 70 - 99 mg/dL   BUN 13 8 - 23 mg/dL   Creatinine, Ser 0.96 0.61 - 1.24 mg/dL   Calcium 9.6 8.9 - 10.3 mg/dL   Total Protein 9.3 (H) 6.5 - 8.1 g/dL   Albumin 4.8 3.5 - 5.0 g/dL   AST 28 15 - 41 U/L   ALT 8 0 - 44 U/L   Alkaline Phosphatase 49 38 - 126 U/L   Total Bilirubin 1.0 0.3 - 1.2 mg/dL   GFR, Estimated >60 >60 mL/min   Anion gap 11 5 - 15  CBC with Differential     Status: Abnormal   Collection Time: 12/01/22  3:57 PM  Result Value Ref Range   WBC 12.1 (H) 4.0 - 10.5 K/uL   RBC 5.32 4.22 - 5.81 MIL/uL   Hemoglobin 19.1 (H) 13.0 - 17.0 g/dL   HCT 57.5 (H) 39.0 - 52.0 %   MCV 108.1 (H) 80.0 - 100.0 fL   MCH 35.9 (H) 26.0 - 34.0 pg   MCHC 33.2 30.0 - 36.0 g/dL   RDW 12.2 11.5 - 15.5 %   Platelets 218 150 - 400 K/uL   nRBC 0.0 0.0 - 0.2 %   Neutrophils Relative % 79 %   Neutro Abs 9.5 (H) 1.7 - 7.7 K/uL   Lymphocytes Relative 12 %   Lymphs Abs 1.4 0.7 - 4.0 K/uL   Monocytes Relative 8 %   Monocytes Absolute 1.0 0.1 - 1.0 K/uL   Eosinophils Relative 1 %   Eosinophils Absolute 0.1 0.0 - 0.5 K/uL   Basophils Relative 0 %   Basophils Absolute 0.1 0.0 - 0.1 K/uL   Immature Granulocytes 0 %   Abs Immature Granulocytes 0.03 0.00 - 0.07 K/uL  Resp Panel by RT-PCR (Flu A&B, Covid) Anterior Nasal Swab     Status: None   Collection Time: 12/01/22  3:57 PM   Specimen: Anterior Nasal Swab  Result Value Ref Range   SARS Coronavirus 2 by RT PCR NEGATIVE NEGATIVE   Influenza A by PCR NEGATIVE NEGATIVE   Influenza B by PCR NEGATIVE NEGATIVE  Troponin I (High Sensitivity)     Status: Abnormal   Collection Time: 12/01/22  3:57 PM  Result Value Ref Range   Troponin I (High Sensitivity) 50 (H) <18 ng/L  Lactic acid, plasma     Status: None   Collection Time: 12/01/22  4:29 PM  Result Value Ref Range   Lactic Acid, Venous 1.8 0.5 - 1.9  mmol/L   Lactic acid, plasma     Status: None   Collection Time: 12/01/22  6:32 PM  Result Value Ref Range   Lactic Acid, Venous 1.2 0.5 - 1.9 mmol/L  Troponin I (High Sensitivity)     Status: Abnormal   Collection Time: 12/01/22  6:32 PM  Result Value Ref Range   Troponin I (High Sensitivity) 103 (HH) <18 ng/L  TSH     Status: None   Collection Time: 12/01/22  7:21 PM  Result Value Ref Range   TSH 0.495 0.350 - 4.500 uIU/mL  Brain natriuretic peptide     Status: None   Collection Time: 12/01/22  7:37 PM  Result Value Ref Range   B Natriuretic Peptide 61.4 0.0 - 100.0 pg/mL  Lactic acid, plasma     Status: None   Collection Time: 12/01/22  7:37 PM  Result Value Ref Range   Lactic Acid, Venous 0.9 0.5 - 1.9 mmol/L  CK     Status: None   Collection Time: 12/01/22  9:36 PM  Result Value Ref Range   Total CK 72 49 - 397 U/L  T4, free     Status: None   Collection Time: 12/01/22  9:36 PM  Result Value Ref Range   Free T4 1.00 0.61 - 1.12 ng/dL  Troponin I (High Sensitivity)     Status: Abnormal   Collection Time: 12/01/22  9:36 PM  Result Value Ref Range   Troponin I (High Sensitivity) 84 (H) <18 ng/L    Unresulted Labs (From admission, onward)     Start     Ordered   12/02/22 0500  Comprehensive metabolic panel  Tomorrow morning,   STAT        12/01/22 1927   12/02/22 0500  CBC  Tomorrow morning,   STAT        12/01/22 1927   12/01/22 2214  Lactic acid, plasma  Once,   STAT        12/01/22 2214   12/01/22 2214  Vitamin B12  Once,   AD        12/01/22 2214   12/01/22 2130  Procalcitonin  Once,   R        12/01/22 2130   12/01/22 2040  Ethanol  Once,   R        12/01/22 2040   12/01/22 1957  Ethanol  Add-on,   AD        12/01/22 1956   12/01/22 1557  Blood Culture (routine x 2)  (Undifferentiated presentation (screening labs and basic nursing orders))  BLOOD CULTURE X 2,   STAT      12/01/22 1557   12/01/22 1557  Urinalysis, Complete w Microscopic  (Undifferentiated  presentation (screening labs and basic nursing orders))  ONCE - URGENT,   URGENT        12/01/22 1557           Pt has received : Orders Placed This Encounter  Procedures   Blood Culture (routine x 2)    Standing Status:   Standing    Number of Occurrences:   2   Resp Panel by RT-PCR (Flu A&B, Covid) Anterior Nasal Swab    Standing Status:   Standing    Number of Occurrences:   1   DG Chest Port 1 View    Standing Status:   Standing    Number of Occurrences:   1    Order Specific Question:   Reason for  Exam (SYMPTOM  OR DIAGNOSIS REQUIRED)    Answer:   Questionable sepsis - evaluate for abnormality   Lactic acid, plasma    Standing Status:   Standing    Number of Occurrences:   2   Comprehensive metabolic panel    Standing Status:   Standing    Number of Occurrences:   1   CBC with Differential    Standing Status:   Standing    Number of Occurrences:   1   Urinalysis, Complete w Microscopic    Standing Status:   Standing    Number of Occurrences:   1   Brain natriuretic peptide    Standing Status:   Standing    Number of Occurrences:   1   TSH    Standing Status:   Standing    Number of Occurrences:   1   Comprehensive metabolic panel    Standing Status:   Standing    Number of Occurrences:   1   CBC    Standing Status:   Standing    Number of Occurrences:   1   Lactic acid, plasma    Standing Status:   Standing    Number of Occurrences:   2   Ethanol    Standing Status:   Standing    Number of Occurrences:   1   Lactic acid, plasma    Standing Status:   Standing    Number of Occurrences:   1   Vitamin B12    Standing Status:   Standing    Number of Occurrences:   1   Ethanol    Standing Status:   Standing    Number of Occurrences:   1   CK    Standing Status:   Standing    Number of Occurrences:   1   T4, free    Standing Status:   Standing    Number of Occurrences:   1   Procalcitonin    Standing Status:   Standing    Number of Occurrences:   1    Diet NPO time specified    Standing Status:   Standing    Number of Occurrences:   1   Cardiac monitoring    Standing Status:   Standing    Number of Occurrences:   1   Document height and weight    Standing Status:   Standing    Number of Occurrences:   1   Assess and Document Glasgow Coma Scale    Standing Status:   Standing    Number of Occurrences:   1   Initiate Carrier Fluid Protocol    Standing Status:   Standing    Number of Occurrences:   1   Swallow screen    Standing Status:   Standing    Number of Occurrences:   1   Maintain IV access    Standing Status:   Standing    Number of Occurrences:   1   Vital signs    Standing Status:   Standing    Number of Occurrences:   1   Notify physician (specify)    Standing Status:   Standing    Number of Occurrences:   20    Order Specific Question:   Notify Physician    Answer:   for pulse less than 55 or greater than 120    Order Specific Question:   Notify Physician    Answer:  for respiratory rate less than 12 or greater than 25    Order Specific Question:   Notify Physician    Answer:   for temperature greater than 100.5 F    Order Specific Question:   Notify Physician    Answer:   for urinary output less than 30 mL/hr for four hours    Order Specific Question:   Notify Physician    Answer:   for systolic BP less than 90 or greater than 557, diastolic BP less than 60 or greater than 100    Order Specific Question:   Notify Physician    Answer:   for new hypoxia w/ oxygen saturations < 88%   Progressive Mobility Protocol: No Restrictions    Standing Status:   Standing    Number of Occurrences:   1   Daily weights    Standing Status:   Standing    Number of Occurrences:   1   Intake and Output    Standing Status:   Standing    Number of Occurrences:   1   Initiate Oral Care Protocol    Standing Status:   Standing    Number of Occurrences:   1   Initiate Carrier Fluid Protocol    Standing Status:   Standing     Number of Occurrences:   1   RN may order General Admission PRN Orders utilizing "General Admission PRN medications" (through manage orders) for the following patient needs: allergy symptoms (Claritin), cold sores (Carmex), cough (Robitussin DM), eye irritation (Liquifilm Tears), hemorrhoids (Tucks), indigestion (Maalox), minor skin irritation (Hydrocortisone Cream), muscle pain ([place]), nose irritation (saline nasal spray) and sore throat (Chloraseptic spray).    Standing Status:   Standing    Number of Occurrences:   [id number]   Cardiac Monitoring Continuous x 48 hours Indications for use: Other; Other indications for use: respiratory distress    Standing Status:   Standing    Number of Occurrences:   1    Order Specific Question:   Indications for use:    Answer:   Other    Order Specific Question:   Other indications for use:    Answer:   respiratory distress   Full code    Standing Status:   Standing    Number of Occurrences:   1   Consult to hospitalist    Standing Status:   Standing    Number of Occurrences:   1    Order Specific Question:   Place call to:    Answer:   hospitalist    Order Specific Question:   Reason for Consult    Answer:   Admit    Order Specific Question:   Diagnosis/Clinical Info for Consult:    Answer:   COPD   Airborne and Contact precautions    Standing Status:   Standing    Number of Occurrences:   1   Pulse oximetry, continuous    Standing Status:   Standing    Number of Occurrences:   1   Pulse oximetry check with vital signs    Standing Status:   Standing    Number of Occurrences:   1   Oxygen therapy Mode or (Route): Nasal cannula; Liters Per Minute: 2; Keep 02 saturation: greater than 92 %    Standing Status:   Standing    Number of Occurrences:   20    Order Specific Question:   Mode or (Route)    Answer:  Nasal cannula    Order Specific Question:   Liters Per Minute    Answer:   2    Order Specific Question:   Keep 02 saturation    Answer:    greater than 92 %   ED EKG 12-Lead    Standing Status:   Standing    Number of Occurrences:   1    Order Specific Question:   Notes    Answer:   Baseline   EKG 12-Lead    Standing Status:   Standing    Number of Occurrences:   1   EKG 12-Lead    Standing Status:   Standing    Number of Occurrences:   1   Place in observation (patient's expected length of stay will be less than 2 midnights)    Standing Status:   Standing    Number of Occurrences:   1    Order Specific Question:   Hospital Area    Answer:   [place] [100120]    Order Specific Question:   Level of Care    Answer:   Progressive [102]    Order Specific Question:   Admit to Progressive based on following criteria    Answer:   RESPIRATORY PROBLEMS hypoxemic/hypercapnic respiratory failure that is responsive to NIPPV (BiPAP) or High Flow Nasal Cannula (6-80 lpm). Frequent assessment/intervention, no > Q2 hrs < Q4 hrs, to maintain oxygenation and pulmonary hygiene.    Order Specific Question:   Covid Evaluation    Answer:   Asymptomatic - no recent exposure (last 10 days) testing not required    Order Specific Question:   Diagnosis    Answer:   Respiratory distress [546503]    Order Specific Question:   Admitting Physician    Answer:   Cherylann Ratel    Order Specific Question:   Attending Physician    Answer:   Cherylann Ratel   Aspiration precautions    Standing Status:   Standing    Number of Occurrences:   1   Fall precautions    Standing Status:   Standing    Number of Occurrences:   1    Meds ordered this encounter  Medications   ipratropium-albuterol (DUONEB) 0.5-2.5 (3) MG/3ML nebulizer solution 3 mL   ipratropium-albuterol (DUONEB) 0.5-2.5 (3) MG/3ML nebulizer solution 3 mL   ipratropium-albuterol (DUONEB) 0.5-2.5 (3) MG/3ML nebulizer solution 3 mL   methylPREDNISolone sodium succinate (SOLU-MEDROL) 125 mg/2 mL injection 125 mg    IV methylprednisolone will be  converted to either a q12h or q24h frequency with the same total daily dose (TDD).  Ordered Dose: 1 to 125 mg TDD; convert to: TDD q24h.  Ordered Dose: 126 to 250 mg TDD; convert to: TDD div q12h.  Ordered Dose: >250 mg TDD; DAW.   baclofen (LIORESAL) tablet 10 mg   aspirin EC tablet 81 mg    Swallow whole.     albuterol (PROVENTIL) (2.5 MG/3ML) 0.083% nebulizer solution 2.5 mg   Ipratropium-Albuterol (COMBIVENT) respimat 1 puff   morphine (MS CONTIN) 12 hr tablet 30 mg   rosuvastatin (CRESTOR) tablet 20 mg   abacavir-dolutegravir-lamiVUDine (TRIUMEQ) 600-50-300 MG per tablet 1 tablet   pantoprazole (PROTONIX) injection 40 mg   heparin injection 5,000 Units   sodium chloride flush (NS) 0.9 % injection 3 mL   OR Linked Order Group    acetaminophen (TYLENOL) tablet 650 mg    acetaminophen (TYLENOL) suppository 650 mg   methylPREDNISolone  sodium succinate (SOLU-MEDROL) 40 mg/mL injection 40 mg    IV methylprednisolone will be converted to either a q12h or q24h frequency with the same total daily dose (TDD).  Ordered Dose: 1 to 125 mg TDD; convert to: TDD q24h.  Ordered Dose: 126 to 250 mg TDD; convert to: TDD div q12h.  Ordered Dose: >250 mg TDD; DAW.     Admission Imaging : DG Chest Port 1 View  Result Date: 12/01/2022 CLINICAL DATA:  Shortness of breath.  Sepsis. EXAM: PORTABLE CHEST 1 VIEW COMPARISON:  09/16/2021 FINDINGS: Heart size is normal. Chronic aortic atherosclerotic calcification. Advanced chronic emphysema. No sign of consolidation or collapse. No edema or effusion. IMPRESSION: Advanced chronic emphysema. No acute finding. Electronically Signed   By: Nelson Chimes M.D.   On: 12/01/2022 16:16   Physical Examination: Vitals:   12/01/22 2215 12/01/22 2230 12/01/22 2245 12/01/22 2341  BP:      Pulse: 89 88 86   Temp:    (!) 97.4 F (36.3 C)  Resp: '20 19 20   '$ Height:      Weight:      SpO2: 97% 97% 97%   TempSrc:    Axillary  BMI (Calculated):       Physical  Exam Vitals and nursing note reviewed.  Constitutional:      General: He is not in acute distress.    Appearance: Normal appearance. He is not ill-appearing, toxic-appearing or diaphoretic.  HENT:     Head: Normocephalic and atraumatic.     Right Ear: Hearing and external ear normal.     Left Ear: Hearing and external ear normal.     Nose: Nose normal. No nasal deformity.     Mouth/Throat:     Lips: Pink.     Mouth: Mucous membranes are moist.     Tongue: No lesions.     Pharynx: Oropharynx is clear.  Eyes:     Extraocular Movements: Extraocular movements intact.     Pupils: Pupils are equal, round, and reactive to light.  Neck:     Vascular: No carotid bruit.  Cardiovascular:     Rate and Rhythm: Normal rate and regular rhythm.     Pulses: Normal pulses.     Heart sounds: Normal heart sounds.  Pulmonary:     Effort: Pulmonary effort is normal.     Breath sounds: Examination of the right-middle field reveals wheezing. Examination of the left-middle field reveals wheezing. Examination of the right-lower field reveals wheezing. Examination of the left-lower field reveals wheezing. Wheezing present. No rhonchi.  Abdominal:     General: Bowel sounds are normal. There is no distension.     Palpations: Abdomen is soft. There is no mass.     Tenderness: There is no abdominal tenderness. There is no guarding.     Hernia: No hernia is present.  Musculoskeletal:     Right lower leg: No edema.     Left lower leg: No edema.  Skin:    General: Skin is warm.  Neurological:     General: No focal deficit present.     Mental Status: He is alert and oriented to person, place, and time.     Cranial Nerves: Cranial nerves 2-12 are intact.     Motor: Motor function is intact.  Psychiatric:        Attention and Perception: Attention normal.        Mood and Affect: Mood normal.        Speech: Speech normal.  Behavior: Behavior normal. Behavior is cooperative.        Cognition and  Memory: Cognition normal.      Assessment and Plan: * Respiratory distress In ed pt started on steroids and MDI.  We will continue same , obtain PCL. Do not think this is copd rather CHF with reduced EF.  Supplemental oxygen .   COPD (chronic obstructive pulmonary disease) (HCC) Stable.  SpO2: 97 % O2 Flow Rate (L/min): 2 L/min FiO2 (%): 40 % Cont with duoneb and oxygen as needed.   Tobacco abuse Nicotine patch.   HIV (human immunodeficiency virus infection) ([place]) Continue triumeq.  Monitor lft and cbc.    NSTEMI (non-ST elevated myocardial infarction) ([place]) Pt has h/o NSTEMI in jan 2023. Cardiology consult to discern need to h possible intervention. Managed by cardiology, cont asas/ statin and beta blocker coreg. Today pt has no chest pain, PRN NTG. Suspect mild TNI elevation due to demand ischemia.  EKG show:       HTN (hypertension) Vitals:   12/01/22 2215 12/01/22 2230 12/01/22 2245 12/01/22 2341  BP:      Pulse: 89 88 86   Temp:    (!) 97.4 F (36.3 C)  Resp: '20 19 20   '$ Height:      Weight:      SpO2: 97% 97% 97%   TempSrc:    Axillary  BMI (Calculated):       Vitals:   12/01/22 1550 12/01/22 1635 12/01/22 1730 12/01/22 1800  BP: (!) 184/87 (!) 141/82 131/79 137/69   12/01/22 1830 12/01/22 1900 12/01/22 2000 12/01/22 2030  BP: 123/72 117/72 (!) 121/59 127/80   12/01/22 2100 12/01/22 2130 12/01/22 2200  BP: 130/64 111/77 116/66  Continue coreg and prn hydralazine.    Hepatitis C Hep C and elevated MCV and  protein.   Anxiety Continue valium at half dose of 5 mg prn q8 as needed    DVT prophylaxis:  Heparin   Code Status:  Full Code   Family Communication:  [phone] [facility] Phone)  [phone]   Disposition Plan:  Home   Consults called:  Cardiology  Admission status:  Inpatient   Unit/ Expected LOS: Progressive/ 2 days.   Para Skeans MD [facility]  6 PM- 2 AM. Please contact me via secure Chat 6 PM-2  AM. [phone]( Pager ) To contact the [facility] Attending or Consulting provider [place] or covering provider during after hours [place], for this patient.   Check the care team in [facility] and look for a) attending/consulting [facility] provider listed and b) the [facility] team listed Log into [email or web address] and use [place]'s universal password to access. If you do not have the password, please contact the hospital operator. Locate the [facility] provider you are looking for under [facility] and page to a number that you can be directly reached. If you still have difficulty reaching the provider, please page the [facility] (Director on Call) for the Hospitalists listed on amion for assistance. [email or web address] 12/02/2022, 12:29 AM

## 2022-12-01 NOTE — ED Triage Notes (Addendum)
Pt from home via POV, pt states that he has been having sob that worsened today after waking up from a nap. Pt arrives with obvious resp distress and pallor of skin.

## 2022-12-02 DIAGNOSIS — J439 Emphysema, unspecified: Secondary | ICD-10-CM | POA: Diagnosis present

## 2022-12-02 DIAGNOSIS — M62838 Other muscle spasm: Secondary | ICD-10-CM | POA: Diagnosis present

## 2022-12-02 DIAGNOSIS — I252 Old myocardial infarction: Secondary | ICD-10-CM | POA: Diagnosis not present

## 2022-12-02 DIAGNOSIS — Z888 Allergy status to other drugs, medicaments and biological substances status: Secondary | ICD-10-CM | POA: Diagnosis not present

## 2022-12-02 DIAGNOSIS — B1920 Unspecified viral hepatitis C without hepatic coma: Secondary | ICD-10-CM | POA: Diagnosis present

## 2022-12-02 DIAGNOSIS — J441 Chronic obstructive pulmonary disease with (acute) exacerbation: Secondary | ICD-10-CM | POA: Diagnosis present

## 2022-12-02 DIAGNOSIS — R0603 Acute respiratory distress: Secondary | ICD-10-CM | POA: Diagnosis not present

## 2022-12-02 DIAGNOSIS — K5909 Other constipation: Secondary | ICD-10-CM | POA: Diagnosis present

## 2022-12-02 DIAGNOSIS — Z79899 Other long term (current) drug therapy: Secondary | ICD-10-CM | POA: Diagnosis not present

## 2022-12-02 DIAGNOSIS — F411 Generalized anxiety disorder: Secondary | ICD-10-CM | POA: Diagnosis present

## 2022-12-02 DIAGNOSIS — G8929 Other chronic pain: Secondary | ICD-10-CM | POA: Diagnosis present

## 2022-12-02 DIAGNOSIS — J9601 Acute respiratory failure with hypoxia: Secondary | ICD-10-CM | POA: Diagnosis present

## 2022-12-02 DIAGNOSIS — F17210 Nicotine dependence, cigarettes, uncomplicated: Secondary | ICD-10-CM | POA: Diagnosis present

## 2022-12-02 DIAGNOSIS — I2510 Atherosclerotic heart disease of native coronary artery without angina pectoris: Secondary | ICD-10-CM | POA: Diagnosis present

## 2022-12-02 DIAGNOSIS — I5032 Chronic diastolic (congestive) heart failure: Secondary | ICD-10-CM | POA: Diagnosis present

## 2022-12-02 DIAGNOSIS — I110 Hypertensive heart disease with heart failure: Secondary | ICD-10-CM | POA: Diagnosis present

## 2022-12-02 DIAGNOSIS — G894 Chronic pain syndrome: Secondary | ICD-10-CM | POA: Insufficient documentation

## 2022-12-02 DIAGNOSIS — Z7982 Long term (current) use of aspirin: Secondary | ICD-10-CM | POA: Diagnosis not present

## 2022-12-02 DIAGNOSIS — Z21 Asymptomatic human immunodeficiency virus [HIV] infection status: Secondary | ICD-10-CM | POA: Diagnosis present

## 2022-12-02 DIAGNOSIS — Z1152 Encounter for screening for COVID-19: Secondary | ICD-10-CM | POA: Diagnosis not present

## 2022-12-02 DIAGNOSIS — Z7951 Long term (current) use of inhaled steroids: Secondary | ICD-10-CM | POA: Diagnosis not present

## 2022-12-02 LAB — CBC
HCT: 49 % (ref 39.0–52.0)
Hemoglobin: 16.5 g/dL (ref 13.0–17.0)
MCH: 36.4 pg — ABNORMAL HIGH (ref 26.0–34.0)
MCHC: 33.7 g/dL (ref 30.0–36.0)
MCV: 108.2 fL — ABNORMAL HIGH (ref 80.0–100.0)
Platelets: 243 10*3/uL (ref 150–400)
RBC: 4.53 MIL/uL (ref 4.22–5.81)
RDW: 11.9 % (ref 11.5–15.5)
WBC: 6.8 10*3/uL (ref 4.0–10.5)
nRBC: 0 % (ref 0.0–0.2)

## 2022-12-02 LAB — URINALYSIS, COMPLETE (UACMP) WITH MICROSCOPIC
Bacteria, UA: NONE SEEN
Bilirubin Urine: NEGATIVE
Glucose, UA: >=500 mg/dL — AB
Hgb urine dipstick: NEGATIVE
Ketones, ur: 20 mg/dL — AB
Leukocytes,Ua: NEGATIVE
Nitrite: NEGATIVE
Protein, ur: 30 mg/dL — AB
Specific Gravity, Urine: 1.032 — ABNORMAL HIGH (ref 1.005–1.030)
pH: 5 (ref 5.0–8.0)

## 2022-12-02 LAB — RESPIRATORY PANEL BY PCR

## 2022-12-02 LAB — BLOOD GAS, VENOUS
Acid-Base Excess: 7 mmol/L — ABNORMAL HIGH (ref 0.0–2.0)
Bicarbonate: 35.1 mmol/L — ABNORMAL HIGH (ref 20.0–28.0)
O2 Saturation: 69.8 %
Patient temperature: 37
pCO2, Ven: 65 mmHg — ABNORMAL HIGH (ref 44–60)
pH, Ven: 7.34 (ref 7.25–7.43)
pO2, Ven: 47 mmHg — ABNORMAL HIGH (ref 32–45)

## 2022-12-02 LAB — LACTIC ACID, PLASMA: Lactic Acid, Venous: 1.4 mmol/L (ref 0.5–1.9)

## 2022-12-02 LAB — COMPREHENSIVE METABOLIC PANEL
ALT: 9 U/L (ref 0–44)
AST: 24 U/L (ref 15–41)
Albumin: 3.7 g/dL (ref 3.5–5.0)
Alkaline Phosphatase: 45 U/L (ref 38–126)
Anion gap: 7 (ref 5–15)
BUN: 18 mg/dL (ref 8–23)
CO2: 31 mmol/L (ref 22–32)
Calcium: 8.9 mg/dL (ref 8.9–10.3)
Chloride: 103 mmol/L (ref 98–111)
Creatinine, Ser: 0.93 mg/dL (ref 0.61–1.24)
GFR, Estimated: >60 mL/min (ref >60)
Glucose, Bld: 118 mg/dL — ABNORMAL HIGH (ref 70–99)
Potassium: 4.6 mmol/L (ref 3.5–5.1)
Sodium: 141 mmol/L (ref 135–145)
Total Bilirubin: 0.8 mg/dL (ref 0.3–1.2)
Total Protein: 7.1 g/dL (ref 6.5–8.1)

## 2022-12-02 LAB — ETHANOL
Alcohol, Ethyl (B): <10 mg/dL (ref <10)
Alcohol, Ethyl (B): <10 mg/dL (ref <10)

## 2022-12-02 LAB — D-DIMER, QUANTITATIVE: D-Dimer, Quant: 0.51 ug/mL-FEU — ABNORMAL HIGH (ref 0.00–0.50)

## 2022-12-02 LAB — VITAMIN B12: Vitamin B-12: 539 pg/mL (ref 180–914)

## 2022-12-02 LAB — PROCALCITONIN: Procalcitonin: <0.1 ng/mL

## 2022-12-02 MED ORDER — HEPARIN SODIUM (PORCINE) 5000 UNIT/ML IJ SOLN
5000.0000 [IU] | Freq: Two times a day (BID) | INTRAMUSCULAR | Status: DC
  Administered 2022-12-02: 5000 [IU] via SUBCUTANEOUS
  Filled 2022-12-02: qty 1

## 2022-12-02 MED ORDER — PANTOPRAZOLE SODIUM 40 MG PO TBEC
40.0000 mg | DELAYED_RELEASE_TABLET | Freq: Two times a day (BID) | ORAL | Status: DC
  Administered 2022-12-02 – 2022-12-05 (×6): 40 mg via ORAL
  Filled 2022-12-02 (×6): qty 1

## 2022-12-02 MED ORDER — ENOXAPARIN SODIUM 40 MG/0.4ML IJ SOSY
40.0000 mg | PREFILLED_SYRINGE | INTRAMUSCULAR | Status: DC
  Administered 2022-12-03 – 2022-12-04 (×2): 40 mg via SUBCUTANEOUS
  Filled 2022-12-02 (×2): qty 0.4

## 2022-12-02 MED ORDER — ENOXAPARIN SODIUM 40 MG/0.4ML IJ SOSY: 40.0000 mg | PREFILLED_SYRINGE | INTRAMUSCULAR | Status: DC

## 2022-12-02 MED ORDER — IPRATROPIUM-ALBUTEROL 0.5-2.5 (3) MG/3ML IN SOLN
3.0000 mL | Freq: Three times a day (TID) | RESPIRATORY_TRACT | Status: DC
  Administered 2022-12-03 – 2022-12-05 (×7): 3 mL via RESPIRATORY_TRACT
  Filled 2022-12-02 (×7): qty 3

## 2022-12-02 MED ORDER — SODIUM CHLORIDE 0.9 % IV SOLN
1.0000 g | INTRAVENOUS | Status: DC
  Administered 2022-12-02 – 2022-12-05 (×4): 1 g via INTRAVENOUS
  Filled 2022-12-02: qty 1
  Filled 2022-12-02: qty 10
  Filled 2022-12-02 (×2): qty 1

## 2022-12-02 MED ORDER — DIAZEPAM 5 MG PO TABS: 10.0000 mg | ORAL_TABLET | Freq: Four times a day (QID) | ORAL | Status: DC | PRN

## 2022-12-02 MED ORDER — DAPAGLIFLOZIN PROPANEDIOL 10 MG PO TABS
10.0000 mg | ORAL_TABLET | Freq: Every evening | ORAL | Status: DC
  Administered 2022-12-02 – 2022-12-04 (×3): 10 mg via ORAL
  Filled 2022-12-02 (×3): qty 1

## 2022-12-02 MED ORDER — ALBUTEROL SULFATE (2.5 MG/3ML) 0.083% IN NEBU
2.5000 mg | INHALATION_SOLUTION | RESPIRATORY_TRACT | Status: DC | PRN
  Administered 2022-12-02: 2.5 mg via RESPIRATORY_TRACT
  Filled 2022-12-02: qty 3

## 2022-12-02 NOTE — Assessment & Plan Note (Addendum)
Pt has h/o NSTEMI in jan 2023. Cardiology consult to discern need to h possible intervention. Managed by cardiology, cont asas/ statin and beta blocker coreg. Today pt has no chest pain, PRN NTG. Suspect mild TNI elevation due to demand ischemia.  EKG show:

## 2022-12-02 NOTE — Progress Notes (Signed)
PROGRESS NOTE    Albert Wong  ENI:[id number] DOB: 11-03-1956 DOA: 12/01/2022 PCP: Waylan Rocher, MD  Outpatient Specialists: ID, cardiology, pulmonology    Brief Narrative:   From admission h and p by dr. Carlos Levering is an 66 y.o. male  with c/o SOB that was worse when he woke up from a nap.  In respiratory distress on arrival  with hypoxia. Pt has been sob off and on since about 5 months ago when he had an NSTEMI. Today he used his nebs and inhalers but his sob did not improve.  Initial vitals shows o2 at 32% on pulse ox and tach at 141 and RR at 30.    Assessment & Plan:   Principal Problem:   Respiratory distress Active Problems:   Anxiety   Hepatitis C   HTN (hypertension)   NSTEMI (non-ST elevated myocardial infarction) (HCC)   HIV (human immunodeficiency virus infection) ([place])   Tobacco abuse   COPD (chronic obstructive pulmonary disease) (HCC)  # Acute hypoxic respiratory failure O2 80s and dyspneic. 2/2 what appears to be copd exacerbation. Tachypnic at rest - [place] O2, wean as able - will have respiratory eval, may need some positive pressure such as with bipap - will check vbg  # COPD, severe, with exacerbation With exacerbation. No focal infiltrate on CXR, procal neg. Covid/flu neg. HIV well controlled. Doesn't appear fluid overloaded, bnp is neg - cont methylpred - breathing treatments - start ceftriaxone - sputum for culture - respiratory viral panel - f/u dimer, CTA if elevated  # HIV Long-standing, well controlled - cont home triumeq  # CAD Non-obstructive on cath last year. 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 Compensated - cont home farxiga  # GAD Home valium prn    DVT prophylaxis: lovenox Code Status: full Family Communication: none @ bedside  Level of care:  Progressive Status is: inpt    Consultants:  none  Procedures: none  Antimicrobials:  ceftriaxone    Subjective: Short of breath, no chest pain  Objective: Vitals:   12/02/22 0453 12/02/22 0600 12/02/22 0700 12/02/22 0800  BP:  121/74 126/66   Pulse:  82 87 90  Resp:  (!) 21  (!) 21  Temp: (!) 97.4 F (36.3 C)     TempSrc: Oral     SpO2:  98% 99% 96%  Weight:      Height:       No intake or output data in the 24 hours ending 12/02/22 0934 Filed Weights   12/01/22 1547  Weight: 64 kg    Examination:  General exam: Appears in mild distress Respiratory system: few scattered rhonchi, tachtypnic Cardiovascular system: S1 & S2 heard, RRR. No JVD, murmurs, rubs, gallops or clicks. No pedal edema. Gastrointestinal system: Abdomen is nondistended, soft and nontender. No organomegaly or masses felt. Normal bowel sounds heard. Central nervous system: Alert and oriented. No focal neurological deficits. Extremities: Symmetric 5 x 5 power. Skin: No rashes, lesions or ulcers Psychiatry: Judgement and insight appear normal. Mood & affect appropriate.     Data Reviewed: I have personally reviewed following labs and imaging studies  CBC: Recent Labs  Lab 12/01/22 1557 12/02/22 0336  WBC 12.1* 6.8  NEUTROABS 9.5*  --   HGB 19.1* 16.5  HCT 57.5* 49.0  MCV 108.1*  108.2*  PLT 218 786   Basic Metabolic Panel: Recent Labs  Lab 12/01/22 1557 12/02/22 0336  NA 141 141  K 4.3 4.6  CL 100 103  CO2 30 31  GLUCOSE 123* 118*  BUN 13 18  CREATININE 0.96 0.93  CALCIUM 9.6 8.9   GFR: Estimated Creatinine Clearance: 69.8 mL/min (by C-G formula based on SCr of 0.93 mg/dL). Liver Function Tests: Recent Labs  Lab 12/01/22 1557 12/02/22 0336  AST 28 24  ALT 8 9  ALKPHOS 49 45  BILITOT 1.0 0.8  PROT 9.3* 7.1  ALBUMIN 4.8 3.7   No results for input(s): "LIPASE", "AMYLASE" in the last 168 hours. No results for input(s): "AMMONIA" in the last 168 hours. Coagulation  Profile: No results for input(s): "INR", "PROTIME" in the last 168 hours. Cardiac Enzymes: Recent Labs  Lab 12/01/22 2136  CKTOTAL 72   BNP (last 3 results) No results for input(s): "PROBNP" in the last 8760 hours. HbA1C: No results for input(s): "HGBA1C" in the last 72 hours. CBG: No results for input(s): "GLUCAP" in the last 168 hours. Lipid Profile: No results for input(s): "CHOL", "HDL", "LDLCALC", "TRIG", "CHOLHDL", "LDLDIRECT" in the last 72 hours. Thyroid Function Tests: Recent Labs    12/01/22 1921 12/01/22 2136  TSH 0.495  --   FREET4  --  1.00   Anemia Panel: Recent Labs    12/01/22 1936  VITAMINB12 539   Urine analysis:    Component Value Date/Time   COLORURINE YELLOW (A) 12/01/2022 1629   APPEARANCEUR HAZY (A) 12/01/2022 1629   APPEARANCEUR Clear 09/06/2022 1300   LABSPEC 1.032 (H) 12/01/2022 1629   LABSPEC 1.005 07/23/2013 1732   PHURINE 5.0 12/01/2022 1629   GLUCOSEU >=500 (A) 12/01/2022 1629   GLUCOSEU Negative 07/23/2013 1732   HGBUR NEGATIVE 12/01/2022 1629   BILIRUBINUR NEGATIVE 12/01/2022 1629   BILIRUBINUR Negative 09/06/2022 1300   BILIRUBINUR Negative 07/23/2013 1732   KETONESUR 20 (A) 12/01/2022 1629   PROTEINUR 30 (A) 12/01/2022 1629   NITRITE NEGATIVE 12/01/2022 1629   LEUKOCYTESUR NEGATIVE 12/01/2022 1629   LEUKOCYTESUR Negative 07/23/2013 1732   Sepsis Labs: '@LABRCNTIP'$ (procalcitonin:4,lacticidven:4)  ) Recent Results (from the past 240 hour(s))  Blood Culture (routine x 2)     Status: None (Preliminary result)   Collection Time: 12/01/22  3:57 PM   Specimen: BLOOD  Result Value Ref Range Status   Specimen Description BLOOD LEFT ANTECUBITAL  Final   Special Requests   Final    BOTTLES DRAWN AEROBIC AND ANAEROBIC Blood Culture results may not be optimal due to an inadequate volume of blood received in culture bottles   Culture   Final    NO GROWTH < 12 HOURS Performed at [facility], [address] [place],  [place] [address]    Report Status PENDING  Incomplete  Resp Panel by RT-PCR (Flu A&B, Covid) Anterior Nasal Swab     Status: None   Collection Time: 12/01/22  3:57 PM   Specimen: Anterior Nasal Swab  Result Value Ref Range Status   SARS Coronavirus 2 by RT PCR NEGATIVE NEGATIVE Final    Comment: (NOTE) SARS-CoV-2 target nucleic acids are NOT DETECTED.  The SARS-CoV-2 RNA is generally detectable in upper respiratory specimens during the acute phase of infection. The lowest concentration of SARS-CoV-2 viral copies this assay can detect is 138 copies/mL. A negative result does not preclude SARS-Cov-2 infection and should not be used as the sole basis for treatment or other patient management decisions. A  negative result may occur with  improper specimen collection/handling, submission of specimen other than nasopharyngeal swab, presence of viral mutation(s) within the areas targeted by this assay, and inadequate number of viral copies(<138 copies/mL). A negative result must be combined with clinical observations, patient history, and epidemiological information. The expected result is Negative.  Fact Sheet for Patients:  EntrepreneurPulse.com.au  Fact Sheet for Healthcare Providers:  IncredibleEmployment.be  This test is no t yet approved or cleared by the [place] FDA and  has been authorized for detection and/or diagnosis of SARS-CoV-2 by FDA under an Emergency Use Authorization (EUA). This EUA will remain  in effect (meaning this test can be used) for the duration of the COVID-19 declaration under Section 564(b)(1) of the Act, 21 U.S.C.section 360bbb-3(b)(1), unless the authorization is terminated  or revoked sooner.       Influenza A by PCR NEGATIVE NEGATIVE Final   Influenza B by PCR NEGATIVE NEGATIVE Final    Comment: (NOTE) The Xpert Xpress SARS-CoV-2/FLU/RSV plus assay is intended as an aid in the diagnosis of influenza from  Nasopharyngeal swab specimens and should not be used as a sole basis for treatment. Nasal washings and aspirates are unacceptable for Xpert Xpress SARS-CoV-2/FLU/RSV testing.  Fact Sheet for Patients: EntrepreneurPulse.com.au  Fact Sheet for Healthcare Providers: IncredibleEmployment.be  This test is not yet approved or cleared by the [place] FDA and has been authorized for detection and/or diagnosis of SARS-CoV-2 by FDA under an Emergency Use Authorization (EUA). This EUA will remain in effect (meaning this test can be used) for the duration of the COVID-19 declaration under Section 564(b)(1) of the Act, 21 U.S.C. section 360bbb-3(b)(1), unless the authorization is terminated or revoked.  Performed at [facility], [address] [place], [place] [address]   Blood Culture (routine x 2)     Status: None (Preliminary result)   Collection Time: 12/01/22  4:02 PM   Specimen: BLOOD  Result Value Ref Range Status   Specimen Description BLOOD RIGHT ANTECUBITAL  Final   Special Requests   Final    BOTTLES DRAWN AEROBIC AND ANAEROBIC Blood Culture results may not be optimal due to an inadequate volume of blood received in culture bottles   Culture   Final    NO GROWTH < 12 HOURS Performed at [facility], [address] [place], [place] [address]    Report Status PENDING  Incomplete         Radiology Studies: DG Chest Port 1 View  Result Date: 12/01/2022 CLINICAL DATA:  Shortness of breath.  Sepsis. EXAM: PORTABLE CHEST 1 VIEW COMPARISON:  09/16/2021 FINDINGS: Heart size is normal. Chronic aortic atherosclerotic calcification. Advanced chronic emphysema. No sign of consolidation or collapse. No edema or effusion. IMPRESSION: Advanced chronic emphysema. No acute finding. Electronically Signed   By: Nelson Chimes M.D.   On: 12/01/2022 16:16        Scheduled Meds:  abacavir-dolutegravir-lamiVUDine  1 tablet Oral  Daily   aspirin EC  81 mg Oral Daily   baclofen  10 mg Oral Daily   heparin  5,000 Units Subcutaneous Q12H   ipratropium-albuterol  3 mL Nebulization Q6H   methylPREDNISolone (SOLU-MEDROL) injection  40 mg Intravenous Q12H   morphine  30 mg Oral BID   pantoprazole (PROTONIX) IV  40 mg Intravenous Q12H   rosuvastatin  20 mg Oral Daily   sodium chloride flush  3 mL Intravenous Q12H   Continuous Infusions:   LOS: 0 days  Desma Maxim, MD [facility]   If 7PM-7AM, please contact night-coverage [email or web address] Password [facility] 12/02/2022, 9:34 AM

## 2022-12-02 NOTE — Assessment & Plan Note (Addendum)
Vitals:   12/01/22 2215 12/01/22 2230 12/01/22 2245 12/01/22 2341  BP:      Pulse: 89 88 86   Temp:    (!) 97.4 F (36.3 C)  Resp: '20 19 20   '$ Height:      Weight:      SpO2: 97% 97% 97%   TempSrc:    Axillary  BMI (Calculated):       Vitals:   12/01/22 1550 12/01/22 1635 12/01/22 1730 12/01/22 1800  BP: (!) 184/87 (!) 141/82 131/79 137/69   12/01/22 1830 12/01/22 1900 12/01/22 2000 12/01/22 2030  BP: 123/72 117/72 (!) 121/59 127/80   12/01/22 2100 12/01/22 2130 12/01/22 2200  BP: 130/64 111/77 116/66  Continue coreg and prn hydralazine.

## 2022-12-02 NOTE — Assessment & Plan Note (Addendum)
Stable.  SpO2: 97 % O2 Flow Rate (L/min): 2 L/min FiO2 (%): 40 % Cont with duoneb and oxygen as needed.

## 2022-12-02 NOTE — Assessment & Plan Note (Signed)
Continue triumeq.  Monitor lft and cbc.

## 2022-12-02 NOTE — Assessment & Plan Note (Signed)
Hep C and elevated MCV and  protein.

## 2022-12-02 NOTE — Assessment & Plan Note (Signed)
-  Nicotine patch 

## 2022-12-02 NOTE — Assessment & Plan Note (Addendum)
In ed pt started on steroids and MDI.  We will continue same , obtain PCL. Do not think this is copd rather CHF with reduced EF.  Supplemental oxygen .

## 2022-12-02 NOTE — Assessment & Plan Note (Signed)
Continue valium at half dose of 5 mg prn q8 as needed

## 2022-12-03 DIAGNOSIS — R0603 Acute respiratory distress: Secondary | ICD-10-CM | POA: Diagnosis not present

## 2022-12-03 LAB — BASIC METABOLIC PANEL
Anion gap: 4 — ABNORMAL LOW (ref 5–15)
BUN: 26 mg/dL — ABNORMAL HIGH (ref 8–23)
CO2: 31 mmol/L (ref 22–32)
Calcium: 8.6 mg/dL — ABNORMAL LOW (ref 8.9–10.3)
Chloride: 104 mmol/L (ref 98–111)
Creatinine, Ser: 0.93 mg/dL (ref 0.61–1.24)
GFR, Estimated: >60 mL/min (ref >60)
Glucose, Bld: 115 mg/dL — ABNORMAL HIGH (ref 70–99)
Potassium: 4.5 mmol/L (ref 3.5–5.1)
Sodium: 139 mmol/L (ref 135–145)

## 2022-12-03 LAB — CBC
HCT: 44.4 % (ref 39.0–52.0)
Hemoglobin: 15 g/dL (ref 13.0–17.0)
MCH: 36.1 pg — ABNORMAL HIGH (ref 26.0–34.0)
MCHC: 33.8 g/dL (ref 30.0–36.0)
MCV: 107 fL — ABNORMAL HIGH (ref 80.0–100.0)
Platelets: 288 10*3/uL (ref 150–400)
RBC: 4.15 MIL/uL — ABNORMAL LOW (ref 4.22–5.81)
RDW: 12 % (ref 11.5–15.5)
WBC: 15 10*3/uL — ABNORMAL HIGH (ref 4.0–10.5)
nRBC: 0 % (ref 0.0–0.2)

## 2022-12-03 MED ORDER — CROMOLYN SODIUM 4 % OP SOLN: 2.0000 [drp] | OPHTHALMIC | Status: DC | PRN

## 2022-12-03 MED ORDER — FLEET ENEMA 7-19 GM/118ML RE ENEM
1.0000 | ENEMA | Freq: Every day | RECTAL | Status: DC | PRN
  Administered 2022-12-03: 1 via RECTAL

## 2022-12-03 MED ORDER — PREDNISONE 20 MG PO TABS
40.0000 mg | ORAL_TABLET | Freq: Every day | ORAL | Status: DC
  Administered 2022-12-03 – 2022-12-05 (×3): 40 mg via ORAL
  Filled 2022-12-03 (×3): qty 2

## 2022-12-03 NOTE — Plan of Care (Signed)

## 2022-12-03 NOTE — Progress Notes (Signed)
PROGRESS NOTE    Albert Wong  IRW:[id number] DOB: 1956-07-13 DOA: 12/01/2022 PCP: Albert Rocher, MD  Outpatient Specialists: ID, cardiology, pulmonology    Brief Narrative:   From admission h and p by dr. Carlos Wong is an 66 y.o. male  with c/o SOB that was worse when he woke up from a nap.  In respiratory distress on arrival  with hypoxia. Pt has been sob off and on since about 5 months ago when he had an NSTEMI. Today he used his nebs and inhalers but his sob did not improve.  Initial vitals shows o2 at 32% on pulse ox and tach at 141 and RR at 30.    Assessment & Plan:   Principal Problem:   Respiratory distress Active Problems:   Anxiety   Hepatitis C   HTN (hypertension)   NSTEMI (non-ST elevated myocardial infarction) (HCC)   HIV (human immunodeficiency virus infection) (HCC)   Tobacco abuse   COPD (chronic obstructive pulmonary disease) (HCC)   COPD exacerbation (HCC)  # Acute hypoxic respiratory failure O2 80s and dyspneic. 2/2 what appears to be copd exacerbation. Tachypnic at rest. Improving - [place] O2, wean as able - will have respiratory eval, may need some positive pressure such as with bipap  # COPD, severe, with exacerbation With exacerbation. No focal infiltrate on CXR, procal neg. Covid/flu, respiratory viral panel neg. HIV well controlled. Doesn't appear fluid overloaded, bnp is neg. Dimer wnl for age - switch to oral pred - breathing treatments - cont ceftriaxone - sputum for culture if able to provide - PT consult as pt says still very dyspneic with ambulation  # HIV Long-standing, well controlled - cont home triumeq  # CAD Non-obstructive on cath last year. 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 Compensated - cont home farxiga  # GAD Home valium prn  # Chronic  constipation Says has needed a fleets enema to stool for many years - fleets enema prn    DVT prophylaxis: lovenox Code Status: full Family Communication: none @ bedside  Level of care: Telemetry Medical Status is: inpt    Consultants:  none  Procedures: none  Antimicrobials:  ceftriaxone    Subjective: Short of breath, improving.   Objective: Vitals:   12/02/22 1744 12/02/22 2338 12/03/22 0753 12/03/22 0814  BP: 122/71 120/68 (!) 129/116   Pulse: (!) 101 72 73 83  Resp: '16 16 17 16  '$ Temp: 98 F (36.7 C) 97.7 F (36.5 C) 97.8 F (36.6 C)   TempSrc: Oral     SpO2: 94% 96% 92% 93%  Weight:      Height:        Intake/Output Summary (Last 24 hours) at 12/03/2022 0948 Last data filed at 12/03/2022 0940 Gross per 24 hour  Intake 103 ml  Output --  Net 103 ml   Filed Weights   12/01/22 1547  Weight: 64 kg    Examination:  General exam: NAD Respiratory system: few scattered rhonchi, tachypnea resolved Cardiovascular system: S1 & S2 heard, RRR. No JVD, murmurs, rubs, gallops or clicks. No pedal edema. Gastrointestinal system: Abdomen is nondistended, soft and nontender. No organomegaly or masses felt. Normal bowel sounds heard. Central nervous system: Alert and oriented. No focal neurological deficits. Extremities: Symmetric 5 x 5 power. Skin: No rashes, lesions  or ulcers Psychiatry: Judgement and insight appear normal. Mood & affect appropriate.     Data Reviewed: I have personally reviewed following labs and imaging studies  CBC: Recent Labs  Lab 12/01/22 1557 12/02/22 0336 12/03/22 0523  WBC 12.1* 6.8 15.0*  NEUTROABS 9.5*  --   --   HGB 19.1* 16.5 15.0  HCT 57.5* 49.0 44.4  MCV 108.1* 108.2* 107.0*  PLT 218 243 572   Basic Metabolic Panel: Recent Labs  Lab 12/01/22 1557 12/02/22 0336 12/03/22 0523  NA 141 141 139  K 4.3 4.6 4.5  CL 100 103 104  CO2 '30 31 31  '$ GLUCOSE 123* 118* 115*  BUN 13 18 26*  CREATININE 0.96 0.93 0.93   CALCIUM 9.6 8.9 8.6*   GFR: Estimated Creatinine Clearance: 69.8 mL/min (by C-G formula based on SCr of 0.93 mg/dL). Liver Function Tests: Recent Labs  Lab 12/01/22 1557 12/02/22 0336  AST 28 24  ALT 8 9  ALKPHOS 49 45  BILITOT 1.0 0.8  PROT 9.3* 7.1  ALBUMIN 4.8 3.7   No results for input(s): "LIPASE", "AMYLASE" in the last 168 hours. No results for input(s): "AMMONIA" in the last 168 hours. Coagulation Profile: No results for input(s): "INR", "PROTIME" in the last 168 hours. Cardiac Enzymes: Recent Labs  Lab 12/01/22 2136  CKTOTAL 72   BNP (last 3 results) No results for input(s): "PROBNP" in the last 8760 hours. HbA1C: No results for input(s): "HGBA1C" in the last 72 hours. CBG: No results for input(s): "GLUCAP" in the last 168 hours. Lipid Profile: No results for input(s): "CHOL", "HDL", "LDLCALC", "TRIG", "CHOLHDL", "LDLDIRECT" in the last 72 hours. Thyroid Function Tests: Recent Labs    12/01/22 1921 12/01/22 2136  TSH 0.495  --   FREET4  --  1.00   Anemia Panel: Recent Labs    12/01/22 1936  VITAMINB12 539   Urine analysis:    Component Value Date/Time   COLORURINE YELLOW (A) 12/01/2022 1629   APPEARANCEUR HAZY (A) 12/01/2022 1629   APPEARANCEUR Clear 09/06/2022 1300   LABSPEC 1.032 (H) 12/01/2022 1629   LABSPEC 1.005 07/23/2013 1732   PHURINE 5.0 12/01/2022 1629   GLUCOSEU >=500 (A) 12/01/2022 1629   GLUCOSEU Negative 07/23/2013 1732   HGBUR NEGATIVE 12/01/2022 1629   BILIRUBINUR NEGATIVE 12/01/2022 1629   BILIRUBINUR Negative 09/06/2022 1300   BILIRUBINUR Negative 07/23/2013 1732   KETONESUR 20 (A) 12/01/2022 1629   PROTEINUR 30 (A) 12/01/2022 1629   NITRITE NEGATIVE 12/01/2022 1629   LEUKOCYTESUR NEGATIVE 12/01/2022 1629   LEUKOCYTESUR Negative 07/23/2013 1732   Sepsis Labs: '@LABRCNTIP'$ (procalcitonin:4,lacticidven:4)  ) Recent Results (from the past 240 hour(s))  Blood Culture (routine x 2)     Status: None (Preliminary result)    Collection Time: 12/01/22  3:57 PM   Specimen: BLOOD  Result Value Ref Range Status   Specimen Description BLOOD LEFT ANTECUBITAL  Final   Special Requests   Final    BOTTLES DRAWN AEROBIC AND ANAEROBIC Blood Culture results may not be optimal due to an inadequate volume of blood received in culture bottles   Culture   Final    NO GROWTH < 12 HOURS Performed at [facility], [address] [place], [place] [address]    Report Status PENDING  Incomplete  Resp Panel by RT-PCR (Flu A&B, Covid) Anterior Nasal Swab     Status: None   Collection Time: 12/01/22  3:57 PM   Specimen: Anterior Nasal Swab  Result Value Ref Range Status  SARS Coronavirus 2 by RT PCR NEGATIVE NEGATIVE Final    Comment: (NOTE) SARS-CoV-2 target nucleic acids are NOT DETECTED.  The SARS-CoV-2 RNA is generally detectable in upper respiratory specimens during the acute phase of infection. The lowest concentration of SARS-CoV-2 viral copies this assay can detect is 138 copies/mL. A negative result does not preclude SARS-Cov-2 infection and should not be used as the sole basis for treatment or other patient management decisions. A negative result may occur with  improper specimen collection/handling, submission of specimen other than nasopharyngeal swab, presence of viral mutation(s) within the areas targeted by this assay, and inadequate number of viral copies(<138 copies/mL). A negative result must be combined with clinical observations, patient history, and epidemiological information. The expected result is Negative.  Fact Sheet for Patients:  EntrepreneurPulse.com.au  Fact Sheet for Healthcare Providers:  IncredibleEmployment.be  This test is no t yet approved or cleared by the [place] FDA and  has been authorized for detection and/or diagnosis of SARS-CoV-2 by FDA under an Emergency Use Authorization (EUA). This EUA will remain  in effect (meaning this  test can be used) for the duration of the COVID-19 declaration under Section 564(b)(1) of the Act, 21 U.S.C.section 360bbb-3(b)(1), unless the authorization is terminated  or revoked sooner.       Influenza A by PCR NEGATIVE NEGATIVE Final   Influenza B by PCR NEGATIVE NEGATIVE Final    Comment: (NOTE) The Xpert Xpress SARS-CoV-2/FLU/RSV plus assay is intended as an aid in the diagnosis of influenza from Nasopharyngeal swab specimens and should not be used as a sole basis for treatment. Nasal washings and aspirates are unacceptable for Xpert Xpress SARS-CoV-2/FLU/RSV testing.  Fact Sheet for Patients: EntrepreneurPulse.com.au  Fact Sheet for Healthcare Providers: IncredibleEmployment.be  This test is not yet approved or cleared by the [place] FDA and has been authorized for detection and/or diagnosis of SARS-CoV-2 by FDA under an Emergency Use Authorization (EUA). This EUA will remain in effect (meaning this test can be used) for the duration of the COVID-19 declaration under Section 564(b)(1) of the Act, 21 U.S.C. section 360bbb-3(b)(1), unless the authorization is terminated or revoked.  Performed at [facility], [address] [place], [place] [address]   Blood Culture (routine x 2)     Status: None (Preliminary result)   Collection Time: 12/01/22  4:02 PM   Specimen: BLOOD  Result Value Ref Range Status   Specimen Description BLOOD RIGHT ANTECUBITAL  Final   Special Requests   Final    BOTTLES DRAWN AEROBIC AND ANAEROBIC Blood Culture results may not be optimal due to an inadequate volume of blood received in culture bottles   Culture   Final    NO GROWTH < 12 HOURS Performed at [facility], [address] [place], [place] [address]    Report Status PENDING  Incomplete  Respiratory (~20 pathogens) panel by PCR     Status: None   Collection Time: 12/02/22 10:13 AM   Specimen: Nasopharyngeal Swab;  Respiratory  Result Value Ref Range Status   Adenovirus NOT DETECTED NOT DETECTED Final   Coronavirus 229E NOT DETECTED NOT DETECTED Final    Comment: (NOTE) The Coronavirus on the Respiratory Panel, DOES NOT test for the novel  Coronavirus (2019 nCoV)    Coronavirus HKU1 NOT DETECTED NOT DETECTED Final   Coronavirus NL63 NOT DETECTED NOT DETECTED Final   Coronavirus OC43 NOT DETECTED NOT DETECTED Final   Metapneumovirus NOT DETECTED NOT DETECTED Final   Rhinovirus /  Enterovirus NOT DETECTED NOT DETECTED Final   Influenza A NOT DETECTED NOT DETECTED Final   Influenza B NOT DETECTED NOT DETECTED Final   Parainfluenza Virus 1 NOT DETECTED NOT DETECTED Final   Parainfluenza Virus 2 NOT DETECTED NOT DETECTED Final   Parainfluenza Virus 3 NOT DETECTED NOT DETECTED Final   Parainfluenza Virus 4 NOT DETECTED NOT DETECTED Final   Respiratory Syncytial Virus NOT DETECTED NOT DETECTED Final   Bordetella pertussis NOT DETECTED NOT DETECTED Final   Bordetella Parapertussis NOT DETECTED NOT DETECTED Final   Chlamydophila pneumoniae NOT DETECTED NOT DETECTED Final   Mycoplasma pneumoniae NOT DETECTED NOT DETECTED Final    Comment: Performed at [facility], [place] [address], [place], [place] [address]         Radiology Studies: DG Chest Port 1 View  Result Date: 12/01/2022 CLINICAL DATA:  Shortness of breath.  Sepsis. EXAM: PORTABLE CHEST 1 VIEW COMPARISON:  09/16/2021 FINDINGS: Heart size is normal. Chronic aortic atherosclerotic calcification. Advanced chronic emphysema. No sign of consolidation or collapse. No edema or effusion. IMPRESSION: Advanced chronic emphysema. No acute finding. Electronically Signed   By: Nelson Chimes M.D.   On: 12/01/2022 16:16        Scheduled Meds:  abacavir-dolutegravir-lamiVUDine  1 tablet Oral Daily   aspirin EC  81 mg Oral Daily   baclofen  10 mg Oral Daily   dapagliflozin propanediol  10 mg Oral QPM   enoxaparin (LOVENOX) injection  40 mg  Subcutaneous Q24H   ipratropium-albuterol  3 mL Nebulization TID   morphine  30 mg Oral BID   pantoprazole  40 mg Oral BID   rosuvastatin  20 mg Oral Daily   sodium chloride flush  3 mL Intravenous Q12H   Continuous Infusions:  cefTRIAXone (ROCEPHIN)  IV Stopped (12/02/22 1024)     LOS: 1 day     Desma Maxim, MD [facility]   If 7PM-7AM, please contact night-coverage [email or web address] Password [facility] 12/03/2022, 9:48 AM

## 2022-12-03 NOTE — Plan of Care (Signed)

## 2022-12-04 ENCOUNTER — Ambulatory Visit: Payer: Medicare Other | Admitting: Psychiatry

## 2022-12-04 DIAGNOSIS — R0603 Acute respiratory distress: Secondary | ICD-10-CM | POA: Diagnosis not present

## 2022-12-04 LAB — EXPECTORATED SPUTUM ASSESSMENT W GRAM STAIN, RFLX TO RESP C

## 2022-12-04 MED ORDER — GUAIFENESIN-DM 100-10 MG/5ML PO SYRP
5.0000 mL | ORAL_SOLUTION | ORAL | Status: DC | PRN
  Administered 2022-12-05: 5 mL via ORAL
  Filled 2022-12-04 (×2): qty 10

## 2022-12-04 NOTE — Progress Notes (Signed)
Met with the patient in the room He lives alone but has a sister that helps He uses a cane He drives to his appointments He stated that he need home oxygen, I explained the qualifications, he will have a walking o2 test to see if he qualifies, will arrange if needed

## 2022-12-04 NOTE — [place] Notes (Signed)
PROGRESS NOTE    Albert Wong  DSK:[id number] DOB: 11/21/1956 DOA: 12/01/2022 PCP: Waylan Rocher, MD  Outpatient Specialists: ID, cardiology, pulmonology    Brief Narrative:   From admission h and p by dr. Carlos Levering is an 66 y.o. male  with c/o SOB that was worse when he woke up from a nap.  In respiratory distress on arrival  with hypoxia. Pt has been sob off and on since about 5 months ago when he had an NSTEMI. Today he used his nebs and inhalers but his sob did not improve.  Initial vitals shows o2 at 32% on pulse ox and tach at 141 and RR at 30.    Assessment & Plan:   Principal Problem:   Respiratory distress Active Problems:   Anxiety   Hepatitis C   HTN (hypertension)   NSTEMI (non-ST elevated myocardial infarction) (HCC)   HIV (human immunodeficiency virus infection) (HCC)   Tobacco abuse   COPD (chronic obstructive pulmonary disease) (HCC)   COPD exacerbation (HCC)  # Acute hypoxic respiratory failure O2 80s and dyspneic. 2/2 what appears to be copd exacerbation. Tachypnic at rest. Improving but will likely need to d/c with home O2, will have TOC get to work on that - [place] O2, wean as able  # COPD, severe, with exacerbation With exacerbation. No focal infiltrate on CXR, procal neg. Covid/flu, respiratory viral panel neg. HIV well controlled. Doesn't appear fluid overloaded, bnp is neg. Dimer wnl for age - cont oral pred - breathing treatments - cont ceftriaxone - sputum for culture, ngtd - PT advising HH, will order at d/c  # HIV Long-standing, well controlled - cont home triumeq  # CAD Non-obstructive on cath last year. 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 Compensated - cont home farxiga  # GAD Home valium prn  # Chronic constipation Says has needed a fleets enema to stool for many  years - fleets enema prn    DVT prophylaxis: lovenox Code Status: full Family Communication: none @ bedside  Level of care: Telemetry Medical Status is: inpt    Consultants:  none  Procedures: none  Antimicrobials:  ceftriaxone    Subjective: Short of breath, improving slowly  Objective: Vitals:   12/04/22 0732 12/04/22 0854 12/04/22 1055 12/04/22 1102  BP: 135/73     Pulse: 89     Resp: 16     Temp: 98.2 F (36.8 C)     TempSrc: Oral     SpO2:  92% (!) 84% 94%  Weight:      Height:        Intake/Output Summary (Last 24 hours) at 12/04/2022 1226 Last data filed at 12/03/2022 1642 Gross per 24 hour  Intake 87.48 ml  Output --  Net 87.48 ml   Filed Weights   12/01/22 1547 12/04/22 0500  Weight: 64 kg 62.3 kg    Examination:  General exam: NAD Respiratory system: few scattered rhonchi, tachypnea resolved Cardiovascular system: S1 & S2 heard, RRR. No JVD, murmurs, rubs, gallops or clicks. No pedal edema. Gastrointestinal system: Abdomen is nondistended, soft and nontender. No organomegaly or masses felt. Normal bowel sounds heard. Central nervous system: Alert and oriented. No focal neurological deficits. Extremities: Symmetric 5 x 5 power. Skin: No rashes, lesions or ulcers Psychiatry: Judgement and insight appear  normal. Mood & affect appropriate.     Data Reviewed: I have personally reviewed following labs and imaging studies  CBC: Recent Labs  Lab 12/01/22 1557 12/02/22 0336 12/03/22 0523  WBC 12.1* 6.8 15.0*  NEUTROABS 9.5*  --   --   HGB 19.1* 16.5 15.0  HCT 57.5* 49.0 44.4  MCV 108.1* 108.2* 107.0*  PLT 218 243 712   Basic Metabolic Panel: Recent Labs  Lab 12/01/22 1557 12/02/22 0336 12/03/22 0523  NA 141 141 139  K 4.3 4.6 4.5  CL 100 103 104  CO2 '30 31 31  '$ GLUCOSE 123* 118* 115*  BUN 13 18 26*  CREATININE 0.96 0.93 0.93  CALCIUM 9.6 8.9 8.6*   GFR: Estimated Creatinine Clearance: 67.9 mL/min (by C-G formula based on  SCr of 0.93 mg/dL). Liver Function Tests: Recent Labs  Lab 12/01/22 1557 12/02/22 0336  AST 28 24  ALT 8 9  ALKPHOS 49 45  BILITOT 1.0 0.8  PROT 9.3* 7.1  ALBUMIN 4.8 3.7   No results for input(s): "LIPASE", "AMYLASE" in the last 168 hours. No results for input(s): "AMMONIA" in the last 168 hours. Coagulation Profile: No results for input(s): "INR", "PROTIME" in the last 168 hours. Cardiac Enzymes: Recent Labs  Lab 12/01/22 2136  CKTOTAL 72   BNP (last 3 results) No results for input(s): "PROBNP" in the last 8760 hours. HbA1C: No results for input(s): "HGBA1C" in the last 72 hours. CBG: No results for input(s): "GLUCAP" in the last 168 hours. Lipid Profile: No results for input(s): "CHOL", "HDL", "LDLCALC", "TRIG", "CHOLHDL", "LDLDIRECT" in the last 72 hours. Thyroid Function Tests: Recent Labs    12/01/22 1921 12/01/22 2136  TSH 0.495  --   FREET4  --  1.00   Anemia Panel: Recent Labs    12/01/22 1936  VITAMINB12 539   Urine analysis:    Component Value Date/Time   COLORURINE YELLOW (A) 12/01/2022 1629   APPEARANCEUR HAZY (A) 12/01/2022 1629   APPEARANCEUR Clear 09/06/2022 1300   LABSPEC 1.032 (H) 12/01/2022 1629   LABSPEC 1.005 07/23/2013 1732   PHURINE 5.0 12/01/2022 1629   GLUCOSEU >=500 (A) 12/01/2022 1629   GLUCOSEU Negative 07/23/2013 1732   HGBUR NEGATIVE 12/01/2022 1629   BILIRUBINUR NEGATIVE 12/01/2022 1629   BILIRUBINUR Negative 09/06/2022 1300   BILIRUBINUR Negative 07/23/2013 1732   KETONESUR 20 (A) 12/01/2022 1629   PROTEINUR 30 (A) 12/01/2022 1629   NITRITE NEGATIVE 12/01/2022 1629   LEUKOCYTESUR NEGATIVE 12/01/2022 1629   LEUKOCYTESUR Negative 07/23/2013 1732   Sepsis Labs: '@LABRCNTIP'$ (procalcitonin:4,lacticidven:4)  ) Recent Results (from the past 240 hour(s))  Blood Culture (routine x 2)     Status: None (Preliminary result)   Collection Time: 12/01/22  3:57 PM   Specimen: BLOOD  Result Value Ref Range Status   Specimen  Description BLOOD LEFT ANTECUBITAL  Final   Special Requests   Final    BOTTLES DRAWN AEROBIC AND ANAEROBIC Blood Culture results may not be optimal due to an inadequate volume of blood received in culture bottles   Culture   Final    NO GROWTH 3 DAYS Performed at [facility], [address] [place], [place] [address]    Report Status PENDING  Incomplete  Resp Panel by RT-PCR (Flu A&B, Covid) Anterior Nasal Swab     Status: None   Collection Time: 12/01/22  3:57 PM   Specimen: Anterior Nasal Swab  Result Value Ref Range Status   SARS Coronavirus 2 by RT PCR NEGATIVE  NEGATIVE Final    Comment: (NOTE) SARS-CoV-2 target nucleic acids are NOT DETECTED.  The SARS-CoV-2 RNA is generally detectable in upper respiratory specimens during the acute phase of infection. The lowest concentration of SARS-CoV-2 viral copies this assay can detect is 138 copies/mL. A negative result does not preclude SARS-Cov-2 infection and should not be used as the sole basis for treatment or other patient management decisions. A negative result may occur with  improper specimen collection/handling, submission of specimen other than nasopharyngeal swab, presence of viral mutation(s) within the areas targeted by this assay, and inadequate number of viral copies(<138 copies/mL). A negative result must be combined with clinical observations, patient history, and epidemiological information. The expected result is Negative.  Fact Sheet for Patients:  EntrepreneurPulse.com.au  Fact Sheet for Healthcare Providers:  IncredibleEmployment.be  This test is no t yet approved or cleared by the [place] FDA and  has been authorized for detection and/or diagnosis of SARS-CoV-2 by FDA under an Emergency Use Authorization (EUA). This EUA will remain  in effect (meaning this test can be used) for the duration of the COVID-19 declaration under Section 564(b)(1) of the Act,  21 U.S.C.section 360bbb-3(b)(1), unless the authorization is terminated  or revoked sooner.       Influenza A by PCR NEGATIVE NEGATIVE Final   Influenza B by PCR NEGATIVE NEGATIVE Final    Comment: (NOTE) The Xpert Xpress SARS-CoV-2/FLU/RSV plus assay is intended as an aid in the diagnosis of influenza from Nasopharyngeal swab specimens and should not be used as a sole basis for treatment. Nasal washings and aspirates are unacceptable for Xpert Xpress SARS-CoV-2/FLU/RSV testing.  Fact Sheet for Patients: EntrepreneurPulse.com.au  Fact Sheet for Healthcare Providers: IncredibleEmployment.be  This test is not yet approved or cleared by the [place] FDA and has been authorized for detection and/or diagnosis of SARS-CoV-2 by FDA under an Emergency Use Authorization (EUA). This EUA will remain in effect (meaning this test can be used) for the duration of the COVID-19 declaration under Section 564(b)(1) of the Act, 21 U.S.C. section 360bbb-3(b)(1), unless the authorization is terminated or revoked.  Performed at [facility], [address] [place], [place] [address]   Blood Culture (routine x 2)     Status: None (Preliminary result)   Collection Time: 12/01/22  4:02 PM   Specimen: BLOOD  Result Value Ref Range Status   Specimen Description BLOOD RIGHT ANTECUBITAL  Final   Special Requests   Final    BOTTLES DRAWN AEROBIC AND ANAEROBIC Blood Culture results may not be optimal due to an inadequate volume of blood received in culture bottles   Culture   Final    NO GROWTH 3 DAYS Performed at [facility], [address] [place], [place] [address]    Report Status PENDING  Incomplete  Respiratory (~20 pathogens) panel by PCR     Status: None   Collection Time: 12/02/22 10:13 AM   Specimen: Nasopharyngeal Swab; Respiratory  Result Value Ref Range Status   Adenovirus NOT DETECTED NOT DETECTED Final   Coronavirus 229E  NOT DETECTED NOT DETECTED Final    Comment: (NOTE) The Coronavirus on the Respiratory Panel, DOES NOT test for the novel  Coronavirus (2019 nCoV)    Coronavirus HKU1 NOT DETECTED NOT DETECTED Final   Coronavirus NL63 NOT DETECTED NOT DETECTED Final   Coronavirus OC43 NOT DETECTED NOT DETECTED Final   Metapneumovirus NOT DETECTED NOT DETECTED Final   Rhinovirus / Enterovirus NOT DETECTED NOT DETECTED Final  Influenza A NOT DETECTED NOT DETECTED Final   Influenza B NOT DETECTED NOT DETECTED Final   Parainfluenza Virus 1 NOT DETECTED NOT DETECTED Final   Parainfluenza Virus 2 NOT DETECTED NOT DETECTED Final   Parainfluenza Virus 3 NOT DETECTED NOT DETECTED Final   Parainfluenza Virus 4 NOT DETECTED NOT DETECTED Final   Respiratory Syncytial Virus NOT DETECTED NOT DETECTED Final   Bordetella pertussis NOT DETECTED NOT DETECTED Final   Bordetella Parapertussis NOT DETECTED NOT DETECTED Final   Chlamydophila pneumoniae NOT DETECTED NOT DETECTED Final   Mycoplasma pneumoniae NOT DETECTED NOT DETECTED Final    Comment: Performed at [facility], [place] [address], [place], [place] [address]  Expectorated Sputum Assessment w Gram Stain, Rflx to Resp Cult     Status: None   Collection Time: 12/02/22 11:00 AM   Specimen: Sputum  Result Value Ref Range Status   Specimen Description SPUTUM  Final   Special Requests EXPSU  Final   Sputum evaluation   Final    THIS SPECIMEN IS ACCEPTABLE FOR SPUTUM CULTURE Performed at [facility], [address] [place], [place] [address]    Report Status 12/04/2022 FINAL  Final         Radiology Studies: No results found.      Scheduled Meds:  abacavir-dolutegravir-lamiVUDine  1 tablet Oral Daily   aspirin EC  81 mg Oral Daily   baclofen  10 mg Oral Daily   dapagliflozin propanediol  10 mg Oral QPM   enoxaparin (LOVENOX) injection  40 mg Subcutaneous Q24H   ipratropium-albuterol  3 mL Nebulization TID   morphine  30 mg Oral BID    pantoprazole  40 mg Oral BID   predniSONE  40 mg Oral Q breakfast   rosuvastatin  20 mg Oral Daily   sodium chloride flush  3 mL Intravenous Q12H   Continuous Infusions:  cefTRIAXone (ROCEPHIN)  IV 1 g (12/04/22 0925)     LOS: 2 days     Desma Maxim, MD [facility]   If 7PM-7AM, please contact night-coverage [email or web address] Password [facility] 12/04/2022, 12:26 PM

## 2022-12-04 NOTE — Evaluation (Signed)
Physical Therapy Evaluation Patient Details Name: NEIMAN ROOTS MRN: [id number] DOB: [birth year] Today's Date: 12/04/2022  History of Present Illness  Pt is a 66 y/o M admitted on 12/01/22 after presenting with c/o SOB that was worse after waking up from a nap. Upon arrival, pt was in respiratory distress with hypoxia. Pt is being treated for what appears to be COPD exacerbation. PMH: NSTEMI, HIV, HepC, HTN, anxiety, COPD, tobacco abuse  Clinical Impression  Pt seen for PT evaluation with pt agreeable to tx. Pt reports he lives alone in a 1 level home with level entry & is independent without AD but will use SPC PRN. On this date, pt is able to ambulate without AD with supervision but fatigues quickly. Provided pt with RW & pt ambulates a little bit farther. PT educated pt on benefits of RW for energy conservation. Anticipate pt can d/c home with HHPT but will continue to follow pt acutely to address high level balance, gait with LRAD, and endurance training.  Pt voiced concern re: d/c home with home O2 as pt has gas stove & pt concerned about open flame in same room as O2 - case manager made aware.  Pt on 2L/min via nasal cannula & after 2nd gait trial SpO2 dropped to 82%. Pt required 3L/min, seated rest break, & cuing for pursed lip breathing to recover to >/= 90%. Nurse in room & aware.     Recommendations for follow up therapy are one component of a multi-disciplinary discharge planning process, led by the attending physician.  Recommendations may be updated based on patient status, additional functional criteria and insurance authorization.  Follow Up Recommendations Home health PT      Assistance Recommended at Discharge PRN  Patient can return home with the following  Assistance with cooking/housework;A little help with walking and/or transfers;A little help with bathing/dressing/bathroom;Assist for transportation    Equipment Recommendations Rolling walker (2 wheels)  Recommendations  for Other Services       Functional Status Assessment Patient has had a recent decline in their functional status and demonstrates the ability to make significant improvements in function in a reasonable and predictable amount of time.     Precautions / Restrictions Precautions Precautions: Fall Restrictions Weight Bearing Restrictions: No      Mobility  Bed Mobility Overal bed mobility: Modified Independent             General bed mobility comments: supine<>sit with HOB elevated    Transfers Overall transfer level: Needs assistance Equipment used: None Transfers: Sit to/from Stand Sit to Stand: Independent           General transfer comment: STS from EOB/recliner without AD    Ambulation/Gait Ambulation/Gait assistance: Supervision Gait Distance (Feet):  (50 + 60 ft) Assistive device: None, Rolling walker (2 wheels) Gait Pattern/deviations: Narrow base of support Gait velocity: decreased     General Gait Details: Initial gait trial without AD, 2nd trial with RW. Cuing to ambulate within base of AD.  Stairs            Wheelchair Mobility    Modified Rankin (Stroke Patients Only)       Balance Overall balance assessment: Needs assistance Sitting-balance support: Feet supported Sitting balance-Leahy Scale: Normal     Standing balance support: No upper extremity supported, During functional activity Standing balance-Leahy Scale: Fair  Pertinent Vitals/Pain      Home Living Family/patient expects to be discharged to:: Private residence Living Arrangements: Alone   Type of Home: House Home Access: Level entry       Home Layout: One level Home Equipment: Cane - single point      Prior Function Prior Level of Function : Independent/Modified Independent             Mobility Comments: Pt reports independence with PRN use of SPC, denies recent falls.       Hand Dominance         Extremity/Trunk Assessment   Upper Extremity Assessment Upper Extremity Assessment: Overall WFL for tasks assessed    Lower Extremity Assessment Lower Extremity Assessment: Generalized weakness       Communication   Communication: No difficulties  Cognition Arousal/Alertness: Awake/alert Behavior During Therapy: WFL for tasks assessed/performed Overall Cognitive Status: Within Functional Limits for tasks assessed                                 General Comments: Pt voices being scared to go home 2/2 fear of waking up in the middle of the night with inability to breathe - nurse & PT provide encouragement.        General Comments      Exercises     Assessment/Plan    PT Assessment Patient needs continued PT services  PT Problem List Decreased strength;Cardiopulmonary status limiting activity;Decreased activity tolerance;Decreased mobility;Decreased balance;Decreased knowledge of use of DME       PT Treatment Interventions Therapeutic exercise;DME instruction;Gait training;Balance training;Stair training;Neuromuscular re-education;Functional mobility training;Therapeutic activities;Patient/family education    PT Goals (Current goals can be found in the Care Plan section)  Acute Rehab PT Goals Patient Stated Goal: breathe better PT Goal Formulation: With patient Time For Goal Achievement: 12/18/22 Potential to Achieve Goals: Good    Frequency Min 2X/week     Co-evaluation               AM-PAC PT "6 Clicks" Mobility  Outcome Measure Help needed turning from your back to your side while in a flat bed without using bedrails?: None Help needed moving from lying on your back to sitting on the side of a flat bed without using bedrails?: None Help needed moving to and from a bed to a chair (including a wheelchair)?: None Help needed standing up from a chair using your arms (e.g., wheelchair or bedside chair)?: None Help needed to walk in hospital  room?: A Little Help needed climbing 3-5 steps with a railing? : A Little 6 Click Score: 22    End of Session Equipment Utilized During Treatment: Oxygen Activity Tolerance:  (limited by feeling SOB) Patient left: in chair;with nursing/sitter in room Nurse Communication: Mobility status (O2) PT Visit Diagnosis: Muscle weakness (generalized) (M62.81);Unsteadiness on feet (R26.81)    Time: 1043-1101 PT Time Calculation (min) (ACUTE ONLY): 18 min   Charges:   PT Evaluation $PT Eval Moderate Complexity: [place], PT, DPT 12/04/22, 11:12 AM   Waunita Schooner 12/04/2022, 11:10 AM

## 2022-12-04 NOTE — Plan of Care (Signed)

## 2022-12-04 NOTE — [place] Notes (Signed)
   12/04/22 1301  TOC Assessment  Expected Discharge [place]  Final next level of care [place]  DME Arranged Walker rolling  DME Agency [employer]  Date DME Agency Contacted 12/04/22  Time DME Agency Contacted 1302  Representative spoke with at [place]  [place]  Date [place] 12/04/22  Time [place] 1302  Representative spoke with at [place]  Do you feel safe going back to the place where you live? Y  Permission granted to share information with  Yes, Verbal Permission Granted   The patient will have Oxygen delivered to the bedside by [employer]  DME Sent referral for [facility] to [place] with [facility], awaiting to see if they can accept

## 2022-12-04 NOTE — [place] Notes (Signed)
SATURATION QUALIFICATIONS: (This note is used to comply with regulatory documentation for home oxygen)  Patient Saturations on Room Air at Rest = 88%  Patient Saturations on Room Air while Ambulating = 85%  Patient Saturations on 2 Liters of oxygen while Ambulating = 92%  Patient Saturations on 2 Liters of oxygen at Rest = 93%

## 2022-12-04 NOTE — [place] Notes (Signed)
SATURATION QUALIFICATIONS: (This note is used to comply with regulatory documentation for home oxygen)  Patient Saturations on Room Air at Rest 88%  Patient Saturations on Room Air while Ambulating = 80%  Patient Saturations on 2 Liters of oxygen while Ambulating = 83%  Patient Saturations on 3L = 92%

## 2022-12-05 DIAGNOSIS — R0603 Acute respiratory distress: Secondary | ICD-10-CM | POA: Diagnosis not present

## 2022-12-05 LAB — BASIC METABOLIC PANEL
Anion gap: 8 (ref 5–15)
BUN: 17 mg/dL (ref 8–23)
CO2: 27 mmol/L (ref 22–32)
Calcium: 8.4 mg/dL — ABNORMAL LOW (ref 8.9–10.3)
Chloride: 108 mmol/L (ref 98–111)
Creatinine, Ser: 0.77 mg/dL (ref 0.61–1.24)
GFR, Estimated: >60 mL/min (ref >60)
Glucose, Bld: 81 mg/dL (ref 70–99)
Potassium: 3.8 mmol/L (ref 3.5–5.1)
Sodium: 143 mmol/L (ref 135–145)

## 2022-12-05 MED ORDER — ALUM & MAG HYDROXIDE-SIMETH 200-200-20 MG/5ML PO SUSP
30.0000 mL | ORAL | Status: DC | PRN
  Administered 2022-12-05: 30 mL via ORAL
  Filled 2022-12-05: qty 30

## 2022-12-05 MED ORDER — PREDNISONE 10 MG PO TABS: 40.0000 mg | ORAL_TABLET | Freq: Every day | ORAL | 0 refills | Status: AC

## 2022-12-05 MED ORDER — LEVOFLOXACIN 750 MG PO TABS: 750.0000 mg | ORAL_TABLET | Freq: Every day | ORAL | 0 refills | Status: DC

## 2022-12-05 NOTE — [place] Notes (Signed)
Patient discharging home with home health. Walker and Oxygen delivered at bedside yesterday. IV removed. Instructions given to patient, verbalized understanding. Patient called sister who will come transport patient home shortly.

## 2022-12-05 NOTE — Discharge Summary (Signed)
Albert Wong DJM:[id number] DOB: 26-May-1956 DOA: 12/01/2022  PCP: Waylan Rocher, MD  Admit date: 12/01/2022 Discharge date: 12/05/2022  Time spent: 35 minutes  Recommendations for Outpatient Follow-up:  Pcp and pulmonology f/u     Discharge Diagnoses:  Principal Problem:   Respiratory distress Active Problems:   Anxiety   Hepatitis C   HTN (hypertension)   NSTEMI (non-ST elevated myocardial infarction) ([place])   HIV (human immunodeficiency virus infection) ([place])   Tobacco abuse   COPD (chronic obstructive pulmonary disease) ([place])   COPD exacerbation ([place])   Discharge Condition: stable  Diet recommendation: heart healthy  Filed Weights   12/01/22 1547 12/04/22 0500 12/05/22 0500  Weight: 64 kg 62.3 kg 62.4 kg    History of present illness:  From admission h and p by dr. Carlos Levering is an 66 y.o. male  with c/o SOB that was worse when he woke up from a nap.  In respiratory distress on arrival  with hypoxia. Pt has been sob off and on since about 5 months ago when he had an NSTEMI. Today he used his nebs and inhalers but his sob did not improve.  Initial vitals shows o2 at 32% on pulse ox and tach at 141 and RR at 30.    Hospital Course:   Patient presented with copd exacerbation and acute hypoxic respiratory failure with o2 in the 80s worse with ambulation. Has baseline severe copd. Cxr w/o focal infiltrate, covid/flu/respiratory panel negative. Hiv well controlled, do not suspect opportunistic infection. Doesn't appear fluid overloaded. Dimer wnl for age. Treated with O2, steroids, breathing treatments, and ceftriaxone. Symptomatically improved but still hypoxic, discharged on home o2 and course steroids and abx. Advising close PCP and pulmonology f/u. PT evaluated, advising HH PT/OT which has been ordered.  Procedures: none   Consultations: none  Discharge Exam: Vitals:   12/04/22 2335 12/05/22 0832  BP: 123/73 (!) 100/59  Pulse: 71 89  Resp: 16  17  Temp: 98.1 F (36.7 C) 98 F (36.7 C)  SpO2: 96% 94%    General exam: NAD Respiratory system: few scattered rhonchi, tachypnea resolved Cardiovascular system: S1 & S2 heard, RRR. No JVD, murmurs, rubs, gallops or clicks. No pedal edema. Gastrointestinal system: Abdomen is nondistended, soft and nontender. No organomegaly or masses felt. Normal bowel sounds heard. Central nervous system: Alert and oriented. No focal neurological deficits. Extremities: Symmetric 5 x 5 power. Skin: No rashes, lesions or ulcers Psychiatry: Judgement and insight appear normal. Mood & affect appropriate.   Discharge Instructions   Discharge Instructions     Diet - low sodium heart healthy   Complete by: As directed    Increase activity slowly   Complete by: As directed       Allergies as of 12/05/2022       Reactions   Dovato [dolutegravir-lamivudine] Other (See Comments)   Severe Flu like symptoms Sweating  Shortness of breath Body aches-- Doubt this is Dovato allergy as he is tolerating truimeq which is dovato without abacavir   Entresto [sacubitril-valsartan] Photosensitivity   Dizziness   Metoprolol Succinate [metoprolol] Photosensitivity   Patients reported change in vision. D/c by pt pcp        Medication List     STOP taking these medications    losartan 25 MG tablet Commonly known as: COZAAR   spironolactone 25 MG tablet Commonly known as: ALDACTONE   tamsulosin 0.4 MG Caps capsule Commonly known as: FLOMAX  TAKE these medications    albuterol (2.5 MG/3ML) 0.083% nebulizer solution Commonly known as: PROVENTIL Take by nebulization.   aspirin EC 81 MG tablet Take 1 tablet (81 mg total) by mouth daily. Swallow whole.   B-D 3CC LUER-LOK SYR 21GX1" 21G X 1" 3 ML Misc Generic drug: SYRINGE-NEEDLE (DISP) 3 ML USE AS DIRECTED WITH TESTOSTERONE   baclofen 10 MG tablet Commonly known as: LIORESAL TAKE 1 TABLET BY MOUTH EVERY DAY   Breztri Aerosphere  160-9-4.8 MCG/ACT Aero Generic drug: Budeson-Glycopyrrol-Formoterol Inhale 2 puffs into the lungs 2 (two) times daily.   budesonide-formoterol 80-4.5 MCG/ACT inhaler Commonly known as: SYMBICORT Inhale 2 puffs into the lungs 2 (two) times daily as needed.   cromolyn 4 % ophthalmic solution Commonly known as: OPTICROM SMARTSIG:1-2 Drop(s) In Eye(s) 4-6 Times Daily   diazepam 10 MG tablet Commonly known as: VALIUM Take 10 mg by mouth every 6 (six) hours as needed for anxiety.   dicyclomine 20 MG tablet Commonly known as: BENTYL Take 20 mg by mouth in the morning and at bedtime.   Farxiga 10 MG Tabs tablet Generic drug: dapagliflozin propanediol TAKE 1 TABLET BY MOUTH EVERY EVENING   fluticasone 50 MCG/ACT nasal spray Commonly known as: FLONASE Place into both nostrils.   Ipratropium-Albuterol 20-100 MCG/ACT Aers respimat Commonly known as: COMBIVENT Inhale into the lungs as directed.   ketoconazole 2 % cream Commonly known as: NIZORAL Apply 1 application topically daily as needed for irritation.   levofloxacin 750 MG tablet Commonly known as: LEVAQUIN Take 1 tablet (750 mg total) by mouth daily.   lubiprostone 24 MCG capsule Commonly known as: AMITIZA Take 24 mcg by mouth 2 (two) times daily.   Melatonin 5 MG Caps Take 2 capsules by mouth at bedtime.   morphine 30 MG 12 hr tablet Commonly known as: MS CONTIN Take 1 tablet (30 mg total) by mouth 2 (two) times daily.   predniSONE 10 MG tablet Commonly known as: DELTASONE Take 4 tablets (40 mg total) by mouth daily for 4 days.   rosuvastatin 20 MG tablet Commonly known as: CRESTOR Take 20 mg by mouth daily.   triamcinolone cream 0.1 % Commonly known as: KENALOG Apply 1 Application topically 3 (three) times daily.   Triumeq 600-50-300 MG tablet Generic drug: abacavir-dolutegravir-lamiVUDine Take 1 tablet by mouth daily.   VITAMIN D PO Take 1,000 Units by mouth daily.               Durable  Medical Equipment  (From admission, onward)           Start     Ordered   12/04/22 1226  For home use only DME Walker rolling  Once       Question Answer Comment  Walker: With [place] Wheels   Patient needs a walker to treat with the following condition COPD (chronic obstructive pulmonary disease) ([place])      12/04/22 1225   12/04/22 1226  For home use only DME oxygen  Once       Question Answer Comment  Length of Need 6 Months   Mode or (Route) Nasal cannula   Liters per Minute 3   Frequency Continuous (stationary and portable oxygen unit needed)   Oxygen delivery system Gas      12/04/22 1225   12/04/22 1115  For home use only DME Walker rolling  Once       Question Answer Comment  Walker: With 5 Inch Wheels   Patient needs a  walker to treat with the following condition General weakness      12/04/22 1114           Allergies  Allergen Reactions   Dovato [Dolutegravir-Lamivudine] Other (See Comments)    Severe Flu like symptoms Sweating  Shortness of breath Body aches-- Doubt this is Dovato allergy as he is tolerating truimeq which is dovato without abacavir   Entresto [Sacubitril-Valsartan] Photosensitivity    Dizziness   Metoprolol Succinate [Metoprolol] Photosensitivity    Patients reported change in vision. D/c by pt pcp    Follow-up Information     Entzminger, Mendel [place], MD Follow up.   Specialty: Internal Medicine Contact information: [address] [place] [address] [phone]         Ottie Glazier, MD Follow up.   Specialty: Pulmonary Disease Contact information: [place] [address] [phone]                  The results of significant diagnostics from this hospitalization (including imaging, microbiology, ancillary and laboratory) are listed below for reference.    Significant Diagnostic Studies: DG Chest Port 1 View  Result Date: 12/01/2022 CLINICAL DATA:  Shortness of breath.  Sepsis. EXAM:  PORTABLE CHEST 1 VIEW COMPARISON:  09/16/2021 FINDINGS: Heart size is normal. Chronic aortic atherosclerotic calcification. Advanced chronic emphysema. No sign of consolidation or collapse. No edema or effusion. IMPRESSION: Advanced chronic emphysema. No acute finding. Electronically Signed   By: Nelson Chimes M.D.   On: 12/01/2022 16:16    Microbiology: Recent Results (from the past 240 hour(s))  Blood Culture (routine x 2)     Status: None (Preliminary result)   Collection Time: 12/01/22  3:57 PM   Specimen: BLOOD  Result Value Ref Range Status   Specimen Description BLOOD LEFT ANTECUBITAL  Final   Special Requests   Final    BOTTLES DRAWN AEROBIC AND ANAEROBIC Blood Culture results may not be optimal due to an inadequate volume of blood received in culture bottles   Culture   Final    NO GROWTH 3 DAYS Performed at [facility], [address] [place], [place] [address]    Report Status PENDING  Incomplete  Resp Panel by RT-PCR (Flu A&B, Covid) Anterior Nasal Swab     Status: None   Collection Time: 12/01/22  3:57 PM   Specimen: Anterior Nasal Swab  Result Value Ref Range Status   SARS Coronavirus 2 by RT PCR NEGATIVE NEGATIVE Final    Comment: (NOTE) SARS-CoV-2 target nucleic acids are NOT DETECTED.  The SARS-CoV-2 RNA is generally detectable in upper respiratory specimens during the acute phase of infection. The lowest concentration of SARS-CoV-2 viral copies this assay can detect is 138 copies/mL. A negative result does not preclude SARS-Cov-2 infection and should not be used as the sole basis for treatment or other patient management decisions. A negative result may occur with  improper specimen collection/handling, submission of specimen other than nasopharyngeal swab, presence of viral mutation(s) within the areas targeted by this assay, and inadequate number of viral copies(<138 copies/mL). A negative result must be combined with clinical observations, patient  history, and epidemiological information. The expected result is Negative.  Fact Sheet for Patients:  EntrepreneurPulse.com.au  Fact Sheet for Healthcare Providers:  IncredibleEmployment.be  This test is no t yet approved or cleared by the [place] FDA and  has been authorized for detection and/or diagnosis of SARS-CoV-2 by FDA under an Emergency Use Authorization (EUA). This EUA  will remain  in effect (meaning this test can be used) for the duration of the COVID-19 declaration under Section 564(b)(1) of the Act, 21 U.S.C.section 360bbb-3(b)(1), unless the authorization is terminated  or revoked sooner.       Influenza A by PCR NEGATIVE NEGATIVE Final   Influenza B by PCR NEGATIVE NEGATIVE Final    Comment: (NOTE) The Xpert Xpress SARS-CoV-2/FLU/RSV plus assay is intended as an aid in the diagnosis of influenza from Nasopharyngeal swab specimens and should not be used as a sole basis for treatment. Nasal washings and aspirates are unacceptable for Xpert Xpress SARS-CoV-2/FLU/RSV testing.  Fact Sheet for Patients: EntrepreneurPulse.com.au  Fact Sheet for Healthcare Providers: IncredibleEmployment.be  This test is not yet approved or cleared by the [place] FDA and has been authorized for detection and/or diagnosis of SARS-CoV-2 by FDA under an Emergency Use Authorization (EUA). This EUA will remain in effect (meaning this test can be used) for the duration of the COVID-19 declaration under Section 564(b)(1) of the Act, 21 U.S.C. section 360bbb-3(b)(1), unless the authorization is terminated or revoked.  Performed at [facility], [address] [place], [place] [address]   Blood Culture (routine x 2)     Status: None (Preliminary result)   Collection Time: 12/01/22  4:02 PM   Specimen: BLOOD  Result Value Ref Range Status   Specimen Description BLOOD RIGHT ANTECUBITAL  Final    Special Requests   Final    BOTTLES DRAWN AEROBIC AND ANAEROBIC Blood Culture results may not be optimal due to an inadequate volume of blood received in culture bottles   Culture   Final    NO GROWTH 3 DAYS Performed at [facility], [address] [place], [place] [address]    Report Status PENDING  Incomplete  Respiratory (~20 pathogens) panel by PCR     Status: None   Collection Time: 12/02/22 10:13 AM   Specimen: Nasopharyngeal Swab; Respiratory  Result Value Ref Range Status   Adenovirus NOT DETECTED NOT DETECTED Final   Coronavirus 229E NOT DETECTED NOT DETECTED Final    Comment: (NOTE) The Coronavirus on the Respiratory Panel, DOES NOT test for the novel  Coronavirus (2019 nCoV)    Coronavirus HKU1 NOT DETECTED NOT DETECTED Final   Coronavirus NL63 NOT DETECTED NOT DETECTED Final   Coronavirus OC43 NOT DETECTED NOT DETECTED Final   Metapneumovirus NOT DETECTED NOT DETECTED Final   Rhinovirus / Enterovirus NOT DETECTED NOT DETECTED Final   Influenza A NOT DETECTED NOT DETECTED Final   Influenza B NOT DETECTED NOT DETECTED Final   Parainfluenza Virus 1 NOT DETECTED NOT DETECTED Final   Parainfluenza Virus 2 NOT DETECTED NOT DETECTED Final   Parainfluenza Virus 3 NOT DETECTED NOT DETECTED Final   Parainfluenza Virus 4 NOT DETECTED NOT DETECTED Final   Respiratory Syncytial Virus NOT DETECTED NOT DETECTED Final   Bordetella pertussis NOT DETECTED NOT DETECTED Final   Bordetella Parapertussis NOT DETECTED NOT DETECTED Final   Chlamydophila pneumoniae NOT DETECTED NOT DETECTED Final   Mycoplasma pneumoniae NOT DETECTED NOT DETECTED Final    Comment: Performed at [facility], [place]. [address] [place],  [address]  Expectorated Sputum Assessment w Gram Stain, Rflx to Resp Cult     Status: None   Collection Time: 12/02/22 11:00 AM   Specimen: Sputum  Result Value Ref Range Status   Specimen Description SPUTUM  Final   Special Requests EXPSU  Final    Sputum evaluation  Final    THIS SPECIMEN IS ACCEPTABLE FOR SPUTUM CULTURE Performed at [facility], [address] [place], [place] [address]    Report Status 12/04/2022 FINAL  Final  Culture, Respiratory w Gram Stain     Status: None (Preliminary result)   Collection Time: 12/02/22 11:00 AM   Specimen: SPU  Result Value Ref Range Status   Specimen Description   Final    SPUTUM Performed at [facility], [address], [place], [place] [address]    Special Requests   Final    EXPSU Reflexed from [id number] Performed at [facility], [address] [place], [place] [address]    Gram Stain   Final    RARE WBC PRESENT, PREDOMINANTLY PMN FEW GRAM NEGATIVE RODS RARE GRAM POSITIVE COCCI IN PAIRS Performed at [facility], [place] [address], [place], [place] [address]    Culture PENDING  Incomplete   Report Status PENDING  Incomplete     Labs: Basic Metabolic Panel: Recent Labs  Lab 12/01/22 1557 12/02/22 0336 12/03/22 0523 12/05/22 0235  NA 141 141 139 143  K 4.3 4.6 4.5 3.8  CL 100 103 104 108  CO2 '30 31 31 27  '$ GLUCOSE 123* 118* 115* 81  BUN 13 18 26* 17  CREATININE 0.96 0.93 0.93 0.77  CALCIUM 9.6 8.9 8.6* 8.4*   Liver Function Tests: Recent Labs  Lab 12/01/22 1557 12/02/22 0336  AST 28 24  ALT 8 9  ALKPHOS 49 45  BILITOT 1.0 0.8  PROT 9.3* 7.1  ALBUMIN 4.8 3.7   No results for input(s): "LIPASE", "AMYLASE" in the last 168 hours. No results for input(s): "AMMONIA" in the last 168 hours. CBC: Recent Labs  Lab 12/01/22 1557 12/02/22 0336 12/03/22 0523  WBC 12.1* 6.8 15.0*  NEUTROABS 9.5*  --   --   HGB 19.1* 16.5 15.0  HCT 57.5* 49.0 44.4  MCV 108.1* 108.2* 107.0*  PLT 218 243 288   Cardiac Enzymes: Recent Labs  Lab 12/01/22 2136  CKTOTAL 72   BNP: BNP (last 3 results) Recent Labs    12/01/22 1937  BNP 61.4    ProBNP (last 3 results) No results for input(s): "PROBNP" in the last 8760 hours.  CBG: No  results for input(s): "GLUCAP" in the last 168 hours.     Signed:  Desma Maxim MD.  [facility] 12/05/2022, 9:58 AM

## 2022-12-05 NOTE — Care Management Important Message (Signed)
Important Message  Patient Details  Name: Albert Wong MRN: [id number] Date of Birth: [birth year]   Medicare Important Message Given:  N/A - LOS <3 / Initial given by admissions     Juliann Pulse A Tiago Humphrey 12/05/2022, 10:58 AM

## 2022-12-05 NOTE — TOC Progression Note (Addendum)
Transition of Care [facility]) - Progression Note    Patient Details  Name: Albert Wong MRN: [id number] Date of Birth: [birth year]  Transition of Care [facility]) CM/SW [place], RN Phone Number: 12/05/2022, 9:56 AM  Clinical Narrative:    [employer] is set up, [employer] delivered the oxygen, I reached out to [facility] with [employer] to inquire about bringing him a long Oxygen hose, awaiting a response   Update, [employer] said send him home with his current tubing and they will come to his home and set him up today with longer hose and Equipment   Expected Discharge Plan: [place] Barriers to Discharge: Continued Medical Work up  Expected Discharge Plan and Services Expected Discharge Plan: [place]   Discharge Planning Services: CM Consult   Living arrangements for the past 2 months: Single Family Home                 DME Arranged: Gilford Rile rolling DME Agency: [employer] Date DME Agency Contacted: 12/04/22 Time DME Agency Contacted: 1302 Representative spoke with at DME Agency: [place]: PT [place]: [place] Date [place]: 12/04/22 Time [place]: [place] Representative spoke with at [place]: [place] ([place]) Interventions    Readmission Risk Interventions     No data to display

## 2022-12-05 NOTE — Plan of Care (Signed)

## 2022-12-05 NOTE — Plan of Care (Signed)

## 2022-12-06 ENCOUNTER — Encounter: Payer: Self-pay | Admitting: Obstetrics and Gynecology

## 2022-12-06 DIAGNOSIS — B965 Pseudomonas (aeruginosa) (mallei) (pseudomallei) as the cause of diseases classified elsewhere: Secondary | ICD-10-CM | POA: Insufficient documentation

## 2022-12-06 LAB — CULTURE, RESPIRATORY W GRAM STAIN

## 2022-12-06 LAB — CULTURE, BLOOD (ROUTINE X 2)
Culture: NO GROWTH
Culture: NO GROWTH
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
## 2023-01-04 ENCOUNTER — Encounter: Payer: Self-pay | Admitting: Psychiatry

## 2023-01-04 ENCOUNTER — Ambulatory Visit (INDEPENDENT_AMBULATORY_CARE_PROVIDER_SITE_OTHER): Payer: Medicare Other | Admitting: Psychiatry

## 2023-01-04 VITALS — BP 138/85 | HR 85 | Temp 98.7°F | Ht 69.0 in | Wt 132.0 lb

## 2023-01-04 DIAGNOSIS — F4310 Post-traumatic stress disorder, unspecified: Secondary | ICD-10-CM | POA: Diagnosis not present

## 2023-01-04 DIAGNOSIS — F331 Major depressive disorder, recurrent, moderate: Secondary | ICD-10-CM | POA: Diagnosis not present

## 2023-01-04 MED ORDER — SERTRALINE HCL 25 MG PO TABS: 25.0000 mg | ORAL_TABLET | Freq: Every day | ORAL | 1 refills | Status: DC

## 2023-01-04 NOTE — Patient Instructions (Signed)
Start Sertraline 25 mg at night Next appointment: 2/19 at 1 PM

## 2023-01-05 ENCOUNTER — Other Ambulatory Visit: Payer: Self-pay

## 2023-01-05 DIAGNOSIS — R5383 Other fatigue: Secondary | ICD-10-CM

## 2023-01-05 DIAGNOSIS — Z7689 Persons encountering health services in other specified circumstances: Secondary | ICD-10-CM

## 2023-01-25 ENCOUNTER — Other Ambulatory Visit: Payer: Self-pay

## 2023-01-25 ENCOUNTER — Emergency Department: Payer: 59

## 2023-01-25 ENCOUNTER — Emergency Department
Admission: EM | Admit: 2023-01-25 | Discharge: 2023-01-25 | Disposition: A | Discharge status: Home / Self Care | Payer: 59 | Attending: Emergency Medicine | Admitting: Emergency Medicine

## 2023-01-25 ENCOUNTER — Encounter: Payer: Self-pay | Admitting: Emergency Medicine

## 2023-01-25 DIAGNOSIS — Z21 Asymptomatic human immunodeficiency virus [HIV] infection status: Secondary | ICD-10-CM | POA: Insufficient documentation

## 2023-01-25 DIAGNOSIS — J449 Chronic obstructive pulmonary disease, unspecified: Secondary | ICD-10-CM | POA: Insufficient documentation

## 2023-01-25 DIAGNOSIS — R0602 Shortness of breath: Secondary | ICD-10-CM | POA: Diagnosis not present

## 2023-01-25 DIAGNOSIS — Z1152 Encounter for screening for COVID-19: Secondary | ICD-10-CM | POA: Insufficient documentation

## 2023-01-25 LAB — BASIC METABOLIC PANEL
Anion gap: 8 (ref 5–15)
BUN: 12 mg/dL (ref 8–23)
CO2: 31 mmol/L (ref 22–32)
Calcium: 9.2 mg/dL (ref 8.9–10.3)
Chloride: 103 mmol/L (ref 98–111)
Creatinine, Ser: 0.91 mg/dL (ref 0.61–1.24)
GFR, Estimated: >60 mL/min (ref >60)
Glucose, Bld: 112 mg/dL — ABNORMAL HIGH (ref 70–99)
Potassium: 3.9 mmol/L (ref 3.5–5.1)
Sodium: 142 mmol/L (ref 135–145)

## 2023-01-25 LAB — CBC
HCT: 49.9 % (ref 39.0–52.0)
Hemoglobin: 16.9 g/dL (ref 13.0–17.0)
MCH: 35.5 pg — ABNORMAL HIGH (ref 26.0–34.0)
MCHC: 33.9 g/dL (ref 30.0–36.0)
MCV: 104.8 fL — ABNORMAL HIGH (ref 80.0–100.0)
Platelets: 250 10*3/uL (ref 150–400)
RBC: 4.76 MIL/uL (ref 4.22–5.81)
RDW: 12 % (ref 11.5–15.5)
WBC: 8.6 10*3/uL (ref 4.0–10.5)
nRBC: 0 % (ref 0.0–0.2)

## 2023-01-25 LAB — TROPONIN I (HIGH SENSITIVITY)
Troponin I (High Sensitivity): 4 ng/L (ref <18)
Troponin I (High Sensitivity): 5 ng/L (ref <18)

## 2023-01-25 LAB — RESP PANEL BY RT-PCR (RSV, FLU A&B, COVID)  RVPGX2
Influenza A by PCR: NEGATIVE
Influenza B by PCR: NEGATIVE
Resp Syncytial Virus by PCR: NEGATIVE
SARS Coronavirus 2 by RT PCR: NEGATIVE

## 2023-01-25 LAB — D-DIMER, QUANTITATIVE: D-Dimer, Quant: 0.3 ug/mL-FEU (ref 0.00–0.50)

## 2023-01-25 MED ORDER — MORPHINE SULFATE ER 15 MG PO TBCR
30.0000 mg | EXTENDED_RELEASE_TABLET | Freq: Two times a day (BID) | ORAL | Status: DC
  Administered 2023-01-25: 30 mg via ORAL
  Filled 2023-01-25: qty 2

## 2023-01-25 MED ORDER — IPRATROPIUM-ALBUTEROL 0.5-2.5 (3) MG/3ML IN SOLN
3.0000 mL | Freq: Once | RESPIRATORY_TRACT | Status: AC
  Administered 2023-01-25: 3 mL via RESPIRATORY_TRACT
  Filled 2023-01-25: qty 3
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
## 2023-01-25 NOTE — Discharge Instructions (Addendum)
Use your albuterol inhaler as needed for shortness of breath.   Thank you for choosing [place] for your health care today!  Please see your primary doctor this week for a follow up appointment.   Sometimes, in the early stages of certain disease courses it is difficult to detect in the emergency department evaluation -- so, it is important that you continue to monitor your symptoms and call your doctor right away or return to the emergency department if you develop any new or worsening symptoms.  Please go to the following website to schedule new (and existing) patient appointments:   [email or web address]  If you do not have a primary doctor try calling the following clinics to establish care:  If you have insurance:  [facility] [phone] [place] [address]   [facility]  [phone] [place] [address]   If you do not have insurance:  [facility]  [phone] [address] [place] [address]   The following is another list of primary care offices in the area who are accepting new patients at this time.  Please reach out to one of them directly and let them know you would like to schedule an appointment to follow up on an Emergency Department visit, and/or to establish a new primary care provider (PCP).  There are likely other primary care clinics in the are who are accepting new patients, but this is an excellent place to start:  [facility] physician: Dr Lavon Paganini [address] [place], [place] [address] [phone]  [facility] Lead Physician: Dr Steele Sizer [address], [place], [place] [address] [phone]  [facility] Physician: Dr Park Liter [address] [place], [place] [address] [phone]  [facility] Lead Physician: Dr Dewaine Oats [place], [place], [place] [address] [phone]  [facility] Physician: Dr Halina Maidens [address] Bro[address] [place], [place] [address] [phone]   It was my pleasure to care for you today.   Hoover Brunette Jacelyn Grip, MD

## 2023-01-25 NOTE — ED Triage Notes (Signed)
EMS brings pt in from home for c/o [facility] several wks

## 2023-01-25 NOTE — ED Provider Notes (Signed)
[facility] Provider Note    Event Date/Time   First MD Initiated Contact with Patient 01/25/23 [phone]     (approximate)   History   Shortness of Breath   HPI  Albert Wong is a 66 y.o. male   Past medical history of COPD on 3 L nasal cannula at baseline, HIV, chronic pain who presents to the emergency department with 3 weeks of shortness of breath that he relates to his COPD exacerbated by cold air.  Denies cough, chest pain, fever.  He states that the cold air gets into his lungs and make his lungs hurt and his oxygen tubing lays on the floor of his house which gets cold in the cold oxygen hitting his nose and into his lungs make him short of breath.  He has been compliant with his HIV medications.  He denies any other acute medical complaints.   External Medical Documents Reviewed: Discharge summary dated 12/05/2022 for respiratory distress related to COPD      Physical Exam   Triage Vital Signs: ED Triage Vitals  Enc Vitals Group     BP 01/25/23 0559 (!) 151/86     Pulse Rate 01/25/23 0559 (!) 110     Resp 01/25/23 0559 20     Temp 01/25/23 0559 98.5 F (36.9 C)     Temp Source 01/25/23 0559 Oral     SpO2 01/25/23 0543 97 %     Weight 01/25/23 0604 137 lb (62.1 kg)     Height 01/25/23 0604 '5\' 9"'$  (1.753 m)     Head Circumference --      Peak Flow --      Pain Score 01/25/23 0604 8     Pain Loc --      Pain Edu? --      Excl. in [place]? --     Most recent vital signs: Vitals:   01/25/23 0735 01/25/23 0838  BP: (!) 141/81 130/68  Pulse: 83 68  Resp: 20 18  Temp:    SpO2: 96% 100%    General: Awake, no distress.  CV:  Good peripheral perfusion.  Resp:  Normal effort.  Abd:  No distention.  Other:  Awake alert oriented nontoxic comfortable.  Speaking full sentences no respiratory distress.  Mild wheezing both lung fields without focality.  Hemodynamics reviewed appropriate reassuring mild hypertension 140/80 otherwise no hypoxemia on  his 3 L, no tachycardia.  Skin warm well-perfused, appears euvolemic   ED Results / Procedures / Treatments   Labs (all labs ordered are listed, but only abnormal results are displayed) Labs Reviewed  BASIC METABOLIC PANEL - Abnormal; Notable for the following components:      Result Value   Glucose, Bld 112 (*)    All other components within normal limits  CBC - Abnormal; Notable for the following components:   MCV 104.8 (*)    MCH 35.5 (*)    All other components within normal limits  RESP PANEL BY RT-PCR (RSV, FLU A&B, COVID)  [id number]  D-DIMER, QUANTITATIVE  TROPONIN I (HIGH SENSITIVITY)  TROPONIN I (HIGH SENSITIVITY)     I ordered and reviewed the above labs they are notable for normal white blood cell count, normal creatinine.  Initial troponin is 4.  EKG  ED ECG REPORT I, Lucillie Garfinkel, the attending physician, personally viewed and interpreted this ECG.   Date: 01/25/2023  EKG Time: 0610  Rate: 108  Rhythm: sinus tachycardia  Axis: nl  Intervals:none  ST&T Change: No acute ischemic changes    RADIOLOGY I independently reviewed and interpreted chest x-ray see no obvious focalities or pneumothorax.   PROCEDURES:  Critical Care performed: No  Procedures   MEDICATIONS ORDERED IN ED: Medications  morphine (MS CONTIN) 12 hr tablet 30 mg (30 mg Oral Given 01/25/23 0823)  ipratropium-albuterol (DUONEB) 0.5-2.5 (3) MG/3ML nebulizer solution 3 mL (3 mLs Nebulization Given 01/25/23 9935)     IMPRESSION / MDM / ASSESSMENT AND PLAN / ED COURSE  I reviewed the triage vital signs and the nursing notes.                                Patient's presentation is most consistent with acute presentation with potential threat to life or bodily function.  Differential diagnosis includes, but is not limited to, COPD exacerbation, ACS, PE, bacterial pneumonia, respiratory infection, pneumothorax, sepsis   The patient is on the cardiac monitor to evaluate for evidence of  arrhythmia and/or significant heart rate changes.  MDM: This is a patient with COPD with subacute shortness of breath related to cold air exposure, has some very scant wheezing I will treat with DuoNeb but otherwise no respiratory distress with normal vital signs.  No chest pain currently.  EKG is nonischemic and initial troponin negative, will serial troponin.  Chest x-ray shows no focalities to suggest bacterial pneumonia and white cell count is normal.  Considered PE though less likely given atypical symptoms and normal hemodynamics, but given his progressive shortness of breath over 3 weeks I will evaluate with a D-dimer follow-up with CT angiogram if age-adjusted D-dimer is above threshold.  Fortunately troponins have been flat and D-dimer is negative.  I considered hospitalization for admission or observation but given normal vital signs on home O2 and negative workup as above I think outpatient monitoring and follow-up is most appropriate at this time.  He was given strict return precautions for any new or worsening symptoms.        FINAL CLINICAL IMPRESSION(S) / ED DIAGNOSES   Final diagnoses:  SOB (shortness of breath)     Rx / DC Orders   ED Discharge Orders     None        Note:  This document was prepared using Dragon voice recognition software and may include unintentional dictation errors.    Lucillie Garfinkel, MD 01/25/23 [phone]

## 2023-02-05 ENCOUNTER — Other Ambulatory Visit: Payer: Self-pay | Admitting: Cardiovascular Disease

## 2023-02-06 ENCOUNTER — Ambulatory Visit: Payer: Medicare Other | Admitting: Psychiatry

## 2023-02-12 ENCOUNTER — Ambulatory Visit: Payer: Medicare Other | Admitting: Psychiatry

## 2023-03-01 ENCOUNTER — Encounter: Payer: Self-pay | Admitting: Infectious Diseases

## 2023-03-01 ENCOUNTER — Other Ambulatory Visit
Admission: RE | Admit: 2023-03-01 | Discharge: 2023-03-01 | Disposition: A | Discharge status: Home / Self Care | Payer: 59 | Source: Ambulatory Visit | Attending: Infectious Diseases | Admitting: Infectious Diseases

## 2023-03-01 ENCOUNTER — Ambulatory Visit: Payer: 59 | Attending: Infectious Diseases | Admitting: Infectious Diseases

## 2023-03-01 VITALS — BP 131/71 | HR 87 | Temp 97.9°F | Ht 70.0 in | Wt 133.0 lb

## 2023-03-01 DIAGNOSIS — Z8619 Personal history of other infectious and parasitic diseases: Secondary | ICD-10-CM | POA: Insufficient documentation

## 2023-03-01 DIAGNOSIS — B20 Human immunodeficiency virus [HIV] disease: Secondary | ICD-10-CM | POA: Insufficient documentation

## 2023-03-01 DIAGNOSIS — I2510 Atherosclerotic heart disease of native coronary artery without angina pectoris: Secondary | ICD-10-CM | POA: Diagnosis not present

## 2023-03-01 DIAGNOSIS — E785 Hyperlipidemia, unspecified: Secondary | ICD-10-CM | POA: Insufficient documentation

## 2023-03-01 DIAGNOSIS — Z79624 Long term (current) use of inhibitors of nucleotide synthesis: Secondary | ICD-10-CM | POA: Diagnosis not present

## 2023-03-01 DIAGNOSIS — Z23 Encounter for immunization: Secondary | ICD-10-CM | POA: Insufficient documentation

## 2023-03-01 DIAGNOSIS — I10 Essential (primary) hypertension: Secondary | ICD-10-CM | POA: Insufficient documentation

## 2023-03-01 DIAGNOSIS — F17200 Nicotine dependence, unspecified, uncomplicated: Secondary | ICD-10-CM | POA: Insufficient documentation

## 2023-03-01 DIAGNOSIS — J449 Chronic obstructive pulmonary disease, unspecified: Secondary | ICD-10-CM | POA: Diagnosis not present

## 2023-03-01 DIAGNOSIS — Z79899 Other long term (current) drug therapy: Secondary | ICD-10-CM | POA: Insufficient documentation

## 2023-03-01 LAB — CBC WITH DIFFERENTIAL/PLATELET
Abs Immature Granulocytes: 0.01 10*3/uL (ref 0.00–0.07)
Basophils Absolute: 0.1 10*3/uL (ref 0.0–0.1)
Basophils Relative: 1 %
Eosinophils Absolute: 0.1 10*3/uL (ref 0.0–0.5)
Eosinophils Relative: 1 %
HCT: 48.6 % (ref 39.0–52.0)
Hemoglobin: 16.5 g/dL (ref 13.0–17.0)
Immature Granulocytes: 0 %
Lymphocytes Relative: 21 %
Lymphs Abs: 1.7 10*3/uL (ref 0.7–4.0)
MCH: 35.6 pg — ABNORMAL HIGH (ref 26.0–34.0)
MCHC: 34 g/dL (ref 30.0–36.0)
MCV: 104.7 fL — ABNORMAL HIGH (ref 80.0–100.0)
Monocytes Absolute: 0.7 10*3/uL (ref 0.1–1.0)
Monocytes Relative: 8 %
Neutro Abs: 5.6 10*3/uL (ref 1.7–7.7)
Neutrophils Relative %: 69 %
Platelets: 208 10*3/uL (ref 150–400)
RBC: 4.64 MIL/uL (ref 4.22–5.81)
RDW: 11.9 % (ref 11.5–15.5)
WBC: 8.2 10*3/uL (ref 4.0–10.5)
nRBC: 0 % (ref 0.0–0.2)

## 2023-03-01 LAB — COMPREHENSIVE METABOLIC PANEL
ALT: 11 U/L (ref 0–44)
AST: 24 U/L (ref 15–41)
Albumin: 4.6 g/dL (ref 3.5–5.0)
Alkaline Phosphatase: 36 U/L — ABNORMAL LOW (ref 38–126)
Anion gap: 9 (ref 5–15)
BUN: 11 mg/dL (ref 8–23)
CO2: 29 mmol/L (ref 22–32)
Calcium: 9.5 mg/dL (ref 8.9–10.3)
Chloride: 100 mmol/L (ref 98–111)
Creatinine, Ser: 0.95 mg/dL (ref 0.61–1.24)
GFR, Estimated: >60 mL/min (ref >60)
Glucose, Bld: 91 mg/dL (ref 70–99)
Potassium: 3.9 mmol/L (ref 3.5–5.1)
Sodium: 138 mmol/L (ref 135–145)
Total Bilirubin: 1 mg/dL (ref 0.3–1.2)
Total Protein: 7.3 g/dL (ref 6.5–8.1)

## 2023-03-01 MED ORDER — TRIUMEQ 600-50-300 MG PO TABS: 1.0000 | ORAL_TABLET | Freq: Every day | ORAL | 12 refills | Status: DC

## 2023-03-01 NOTE — Patient Instructions (Signed)
You are here for follow up of hiv- will do labs and continue triumeq

## 2023-03-01 NOTE — Progress Notes (Signed)
NAME: Albert Wong  DOB: 08/11/1956  MRN: [id number]  Date/Time: 03/01/2023 11:25 AM   Subjective:  Albert Wong is a 67 y.o. with a history of HIV disease diagnosed in early 41s , Treated hepatitis C , COPD, , on MS contin for back pain ( pain management by Dr.Crisp), HTN, hyperlipidemia Pt is here for follow up for HIV.  States  2 months ago  he had been feeling unwell and was hospitalized for respiratory infection. He is doing better now He was  losing weight and then realized it was some of his medications that was doing it.  So by trial and error he figured out it was Crestar, melatonin, hydroxyzine, and stopped the meds. He quit smoking last year.  He says his PCP and psychiatrist decided to stop Marinol which he was taking for appetite  He is on Triumeq and 100% adherent.  His PCP Dr.Tejan Con Memos is not within [facility]  Last Vl < 20 and cd4 around 566 2023    Visit from 11/22/21 hospitalized between 09/16/21-09/17/21 with sob on extertion for 3 days which he attributed to switching HIV meds- He was on Triumeq which is abacavir, epivir and dolutegravir to Dovato which is epivir + dolutegravir without abacavir In the ED he had a n increase in troponin to 531 , BNP 48, BP 115/71, CXR neg Had CT angio Mild to moderate nonobstructive coronary artery disease. 2.  Moderately reduced LV systolic function with wall motion abnormality suggestive of stress-induced cardiomyopathy.  Mildly elevated left ventricular end-diastolic pressure. Medical management for non obstructive CAD was recommended. EF was 40-45% Amlodipine was switched to metoprolol [name] was added . He is doing better until he smoked a cigarette and became very sob  Says he almost went blind with a medicine which his PCP asked him to stop- He does not know what the pill was and what he was taking it .    06/01/21  HIV diagnosed  early 27s., says it was a routine blood test, - was in care at [facility] for many years and then went to see  Dr.Fitzgerald in 2014-2019, then [facility]  Nadir Cd4 DK- on  08/14/1991 was 440 VL  OI -None  HAARt history First regimen  Azt based Atripla Now on Triumeq 100% adherent to HAART-   Acquired thru sex with men Not sexually active in 20 years  Genotype unknown ? Past Medical History:  Diagnosis Date   Anxiety    Chronic nausea    COPD (chronic obstructive pulmonary disease) (HCC)    Degenerative disk disease    HIV (human immunodeficiency virus infection) ([place])    HIV (human immunodeficiency virus infection) ([place])    Insomnia    Muscle spasm    Rotator cuff tear     Past Surgical History:  Procedure Laterality Date   dental procedure N/A    LEFT HEART CATH AND CORONARY ANGIOGRAPHY N/A 09/16/2021   Procedure: LEFT HEART CATH AND CORONARY ANGIOGRAPHY;  Surgeon: Wellington Hampshire, MD;  Location: [place] [facility];  Service: Cardiovascular;  Laterality: N/A;    Social History   Socioeconomic History   Marital status: Single    Spouse name: Not on file   Number of children: Not on file   Years of education: Not on file   Highest education level: Not on file  Occupational History   Not on file  Tobacco Use   Smoking status: Former    Packs/day: 1.00    Years: 39.00  Total pack years: 39.00    Types: Cigarettes   Smokeless tobacco: Never   Tobacco comments:    Quit smoking 1 month ago by his report.  Vaping Use   Vaping Use: Former   Devices: tried when they first came out for about 2 weeks  Substance and Sexual Activity   Alcohol use: Not Currently   Drug use: No   Sexual activity: Not on file  Other Topics Concern   Not on file  Social History Narrative   Not on file   Social Determinants of Health   Financial Resource Strain: Not on file  Food Insecurity: No Food Insecurity (12/02/2022)   Hunger Vital Sign    Worried About Running Out of Food in the Last Year: Never true    Ran Out of Food in the Last Year: Never true  Transportation Needs: No  Transportation Needs (12/02/2022)   PRAPARE - Hydrologist (Medical): No    Lack of Transportation (Non-Medical): No  Physical Activity: Not on file  Stress: Not on file  Social Connections: Not on file  Intimate Partner Violence: Not At Risk (12/02/2022)   Humiliation, Afraid, Rape, and Kick questionnaire    Fear of Current or Ex-Partner: No    Emotionally Abused: No    Physically Abused: No    Sexually Abused: No    Family History  Problem Relation Age of Onset   Alzheimer's disease Mother    Breast cancer Mother    Heart disease Father   Mother breast cancer Allergies  Allergen Reactions   Dolutegravir-Lamivudine Other (See Comments)    Severe Flu like symptoms  Sweating   Shortness of breath  Body aches-- Doubt this is Dovato allergy as he is tolerating truimeq which is dovato without abacavir  Other Reaction(s): Other (See Comments)  Severe Flu like symptoms Sweating  Shortness of breath Body aches-- Doubt this is Dovato allergy as he is tolerating truimeq which is dovato without abacavir   Entresto [Sacubitril-Valsartan] Photosensitivity    Dizziness   Metoprolol Succinate [Metoprolol] Photosensitivity    Patients reported change in vision. D/c by pt pcp   ? Current Outpatient Medications  Medication Sig Dispense Refill   albuterol (PROVENTIL) (2.5 MG/3ML) 0.083% nebulizer solution Take by nebulization.     aspirin EC 81 MG EC tablet Take 1 tablet (81 mg total) by mouth daily. Swallow whole. 30 tablet 2   baclofen (LIORESAL) 10 MG tablet TAKE 1 TABLET BY MOUTH EVERY DAY 30 tablet 11   BREZTRI AEROSPHERE 160-9-4.8 MCG/ACT AERO Inhale 2 puffs into the lungs 2 (two) times daily.     budesonide-formoterol (SYMBICORT) 80-4.5 MCG/ACT inhaler Inhale 2 puffs into the lungs 2 (two) times daily as needed.     cromolyn (OPTICROM) 4 % ophthalmic solution      dicyclomine (BENTYL) 20 MG tablet Take 20 mg by mouth in the morning and at bedtime.      fluticasone (FLONASE) 50 MCG/ACT nasal spray Place into both nostrils.     Ipratropium-Albuterol (COMBIVENT) 20-100 MCG/ACT AERS respimat Inhale into the lungs as directed.     ketoconazole (NIZORAL) 2 % cream Apply 1 application topically daily as needed for irritation.     lubiprostone (AMITIZA) 24 MCG capsule Take 24 mcg by mouth 2 (two) times daily.     morphine (MS CONTIN) 30 MG 12 hr tablet Take 1 tablet (30 mg total) by mouth 2 (two) times daily. 60 tablet 0   mupirocin ointment (  BACTROBAN) 2 % SMARTSIG:1 Sparingly Topical Twice Daily PRN     triamcinolone cream (KENALOG) 0.1 % Apply 1 Application topically 3 (three) times daily.     TRIUMEQ 600-50-300 MG tablet Take 1 tablet by mouth daily. 30 tablet 6   VITAMIN D PO Take 1,000 Units by mouth daily.     No current facility-administered medications for this visit.    REVIEW OF SYSTEMS:  Const: negative fever, negative chills, fluctuating weight- when he was taking care of his mother he weighed 129 pounds- After she passed away he was 180. He also was on marinol for PTSD.now he is around 133 ENT: negative coryza, negative sore throat Resp: has copd  Cards: negative for chest pain, palpitations, lower extremity edema GU: negative for frequency, dysuria and hematuria Skin: negative for rash and pruritus Heme: negative for easy bruising and gum/nose bleeding MS: back pain Neurolo:negative for headaches, dizziness, vertigo, memory problems  Psych: PTSD  Objective:  VITALS:  BP 131/71   Pulse 87   Temp 97.9 F (36.6 C) (Temporal)   Ht '5\' 10"'$  (1.778 m)   Wt 133 lb (60.3 kg)   SpO2 94%   BMI 19.08 kg/m  PHYSICAL EXAM:  General: Alert, cooperative, no distress, appears stated age.  Head: Normocephalic, without obvious abnormality, atraumatic. Eyes: Conjunctivae clear, anicteric sclerae. Pupils are equal Full set of dentures Neck: Supple, symmetrical, no adenopathy, thyroid: non tender no carotid bruit and no JVD. Back: No CVA  tenderness. Lungs: b/l rhonchi Heart: Regular rate and rhythm, no murmur, rub or gallop. Abdomen: Soft, non-tender,not distended. Bowel sounds normal. No masses Extremities: Extremities normal, atraumatic, no cyanosis. No edema. No clubbing Skin: dry skin Lymph: Cervical, supraclavicular normal. Neurologic: Grossly non-focal IMAGING RESULTS: Health maintenance Vaccination  Vaccine Date last given comment  Influenza    Hepatitis B Has antibodies   Hepatitis A    Prevnar-PCV-13 ?   Pneumovac-PPSV-23 2020   TdaP 2021   HPV    Shingrix ( zoster vaccine)     ______________________  Labs Lab Result  Date comment  HIV VL <20 06/21/21   CD4 527 06/21/21   Genotype     [id number] NR    HIV antibody     RPR NR 06/21/21   Quantiferon Gold Negative    Hep C ab reactive    Hepatitis B-ab,ag,c Sab > 12.7    Hepatitis A-IgM, IgG /T Total-pos    Lipid TC 138, LDL 90, TGL 61, HDL 36    GC/CHL          HB,PLT,Cr, LFT 13.7, Plt 205      Preventive  Procedure Result  Date comment  colonoscopy  2018        Dental exam     Opthal       Impression/Recommendation ? ?HIV disease- he is well controlled- Vl < 20 and cd4 is 566 on triumeq which is a fixed drug combination of abacavir, 3TC, dolutegravir.   Smoker-quit a few months ago  HTN on amlodipine- losartan HLD- on rosuvastatin- he stopped because he was losing weight and does not want to take it anymore  Not sexually active in 15 yrs and does not want anal pap or STD screen  Treated Hepatits C- HCV RNA not detected  COPD- on inhalers, not on  home oxygen followed by Dr.Aleskerov   LBA- on MS contin Pain  management Dr.Crisp at [place]?   Will do labs today- cd4, HIV rna, RPR, quant gold, CMP,  ________________________________________________ Follow up 6 months or earlier

## 2023-03-02 LAB — T-HELPER CELLS CD4/CD8 %
% CD 4 Pos. Lymph.: 38 % (ref 30.8–58.5)
Absolute CD 4 Helper: 684 /uL (ref 359–1519)
Basophils Absolute: 0.1 10*3/uL (ref 0.0–0.2)
Basos: 1 %
CD3+CD4+ Cells/CD3+CD8+ Cells Bld: 0.84 — ABNORMAL LOW (ref 0.92–3.72)
CD3+CD8+ Cells # Bld: 817 /uL (ref 109–897)
CD3+CD8+ Cells NFr Bld: 45.4 % — ABNORMAL HIGH (ref 12.0–35.5)
EOS (ABSOLUTE): 0.1 10*3/uL (ref 0.0–0.4)
Eos: 1 %
Hematocrit: 47.6 % (ref 37.5–51.0)
Hemoglobin: 16.6 g/dL (ref 13.0–17.7)
Immature Grans (Abs): 0 10*3/uL (ref 0.0–0.1)
Immature Granulocytes: 0 %
Lymphocytes Absolute: 1.8 10*3/uL (ref 0.7–3.1)
Lymphs: 23 %
MCH: 35.2 pg — ABNORMAL HIGH (ref 26.6–33.0)
MCHC: 34.9 g/dL (ref 31.5–35.7)
MCV: 101 fL — ABNORMAL HIGH (ref 79–97)
Monocytes Absolute: 0.6 10*3/uL (ref 0.1–0.9)
Monocytes: 8 %
Neutrophils Absolute: 5.1 10*3/uL (ref 1.4–7.0)
Neutrophils: 67 %
Platelets: 273 10*3/uL (ref 150–450)
RBC: 4.71 x10E6/uL (ref 4.14–5.80)
RDW: 12 % (ref 11.6–15.4)
WBC: 7.6 10*3/uL (ref 3.4–10.8)

## 2023-03-02 LAB — HIV-1 RNA QUANT-NO REFLEX-BLD
HIV 1 RNA Quant: 30 copies/mL
LOG10 HIV-1 RNA: 1.477 log10copy/mL

## 2023-03-02 LAB — RPR: RPR Ser Ql: NONREACTIVE

## 2023-03-05 ENCOUNTER — Telehealth: Payer: Self-pay

## 2023-03-05 NOTE — Telephone Encounter (Signed)
Patient left voicemail inquiring about Cabenuva, called him back, no answer. Left HIPAA compliant voicemail requesting callback.   Beryle Flock, RN

## 2023-03-05 NOTE — Telephone Encounter (Signed)
Patient called wanting to discuss starting Cabenuva. I have relayed the message to Dr. Delaine Lame and she will be discussing this with the Pharmacy team and we will get back to the patient. I have advised the patient that we will follow back up with him. [place] Andri Prestia, CMA

## 2023-03-08 LAB — QUANTIFERON-TB GOLD PLUS (RQFGPL)
QuantiFERON Mitogen Value: >10 IU/mL
QuantiFERON Nil Value: 0.09 IU/mL
QuantiFERON TB1 Ag Value: 0.05 IU/mL
QuantiFERON TB2 Ag Value: 0.05 IU/mL

## 2023-03-08 LAB — QUANTIFERON-TB GOLD PLUS: QuantiFERON-TB Gold Plus: NEGATIVE

## 2023-03-15 ENCOUNTER — Ambulatory Visit: Payer: Medicare Other | Admitting: Cardiovascular Disease

## 2023-03-19 NOTE — Telephone Encounter (Signed)
Please advise on medication change

## 2023-03-22 ENCOUNTER — Telehealth: Payer: Self-pay

## 2023-03-22 NOTE — Telephone Encounter (Signed)
[name] can you look into Cabenuva for this patient. Also pharmacy team can you reach out to the patient and discuss Cabenuva. Dr. Delaine Lame is ok with him switching as well.  Thanks  [name] [name], CMA

## 2023-03-28 ENCOUNTER — Telehealth: Payer: Self-pay | Admitting: Pharmacist

## 2023-03-28 ENCOUNTER — Other Ambulatory Visit: Payer: Self-pay | Admitting: Infectious Diseases

## 2023-03-28 ENCOUNTER — Other Ambulatory Visit: Payer: Self-pay | Admitting: Pharmacist

## 2023-03-28 ENCOUNTER — Other Ambulatory Visit (HOSPITAL_COMMUNITY): Payer: Self-pay

## 2023-03-28 DIAGNOSIS — B20 Human immunodeficiency virus [HIV] disease: Secondary | ICD-10-CM

## 2023-03-28 MED ORDER — CABENUVA 600 & 900 MG/3ML IM SUER
1.0000 | INTRAMUSCULAR | 1 refills | Status: DC
  Filled 2023-03-28: qty 6, 30d supply, fill #0

## 2023-03-28 MED ORDER — CABOTEGRAVIR & RILPIVIRINE ER 600 & 900 MG/3ML IM SUER
1.0000 | INTRAMUSCULAR | 1 refills | Status: DC
  Filled 2023-03-28 – 2023-03-29 (×2): qty 6, 30d supply, fill #0
  Filled 2023-04-27: qty 6, 30d supply, fill #1

## 2023-03-28 MED ORDER — CABOTEGRAVIR & RILPIVIRINE ER 600 & 900 MG/3ML IM SUER
1.0000 | INTRAMUSCULAR | 5 refills | Status: DC
  Filled 2023-03-28 – 2023-06-26 (×2): qty 6, 60d supply, fill #0
  Filled 2023-08-23: qty 6, 60d supply, fill #1
  Filled 2023-10-29: qty 6, 60d supply, fill #2
  Filled 2023-12-24: qty 6, 60d supply, fill #3
  Filled 2024-02-26: qty 6, 60d supply, fill #4

## 2023-03-28 NOTE — Telephone Encounter (Signed)
Patient approved for [facility]; working with Dr. Delaine Lame for care. 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 No resistance to rilpivirine noted. Has taken Triumeq and Dovato.   Counseled that [place] is two separate intramuscular injections in the gluteal muscle on each side for each visit. Explained that the second injection is 30 days after the initial injection then every 2 months thereafter. Discussed the need for viral load monitoring every 2 months for the first 6 months and then periodically afterwards as their provider sees the need. Discussed the rare but significant chance of developing resistance despite compliance. Explained that showing up to injection appointments is very important and warned that if 2 appointments are missed, it will be reassessed by their provider whether they are a good candidate for injection therapy. Counseled on possible side effects associated with the injections such as injection site pain, which is usually mild to moderate in nature, injection site nodules, and injection site reactions. Asked to call the clinic or send me a mychart message if they experience any issues, such as fatigue, nausea, headache, rash, or dizziness. Advised that they can take ibuprofen or tylenol for injection site pain if needed.   Alfonse Spruce, PharmD, CPP, BCIDP, [place] Clinical Pharmacist Practitioner [facility]

## 2023-03-28 NOTE — Progress Notes (Signed)
Sent cabenuva prescrption to [facility]

## 2023-03-28 NOTE — Telephone Encounter (Signed)
Counseled patient on it, and he is all set. Estill Bamberg

## 2023-03-28 NOTE — Telephone Encounter (Signed)
Pharmacy team were you guys going to reach out to him and Dr. Delaine Lame can you send in the medication  Rashunda Passon T Brooks Sailors

## 2023-03-28 NOTE — Telephone Encounter (Signed)
Cabenuva can filled at [facility] $0.00

## 2023-03-29 ENCOUNTER — Other Ambulatory Visit: Payer: Self-pay

## 2023-03-29 ENCOUNTER — Other Ambulatory Visit (HOSPITAL_COMMUNITY): Payer: Self-pay

## 2023-03-30 ENCOUNTER — Other Ambulatory Visit: Payer: Self-pay

## 2023-03-30 ENCOUNTER — Other Ambulatory Visit (HOSPITAL_COMMUNITY): Payer: Self-pay

## 2023-04-10 ENCOUNTER — Other Ambulatory Visit (HOSPITAL_COMMUNITY): Payer: Self-pay

## 2023-04-12 ENCOUNTER — Encounter: Payer: Self-pay | Admitting: Infectious Diseases

## 2023-04-12 ENCOUNTER — Ambulatory Visit: Payer: 59 | Attending: Infectious Diseases | Admitting: Infectious Diseases

## 2023-04-12 VITALS — BP 162/72 | HR 81 | Temp 97.0°F | Ht 70.0 in | Wt 135.0 lb

## 2023-04-12 DIAGNOSIS — B20 Human immunodeficiency virus [HIV] disease: Secondary | ICD-10-CM | POA: Insufficient documentation

## 2023-04-12 DIAGNOSIS — Z79899 Other long term (current) drug therapy: Secondary | ICD-10-CM | POA: Insufficient documentation

## 2023-04-12 DIAGNOSIS — I10 Essential (primary) hypertension: Secondary | ICD-10-CM | POA: Diagnosis not present

## 2023-04-12 DIAGNOSIS — E785 Hyperlipidemia, unspecified: Secondary | ICD-10-CM | POA: Diagnosis not present

## 2023-04-12 DIAGNOSIS — Z87891 Personal history of nicotine dependence: Secondary | ICD-10-CM | POA: Insufficient documentation

## 2023-04-12 DIAGNOSIS — J449 Chronic obstructive pulmonary disease, unspecified: Secondary | ICD-10-CM | POA: Insufficient documentation

## 2023-04-12 DIAGNOSIS — Z8619 Personal history of other infectious and parasitic diseases: Secondary | ICD-10-CM | POA: Diagnosis not present

## 2023-04-12 MED ORDER — CABOTEGRAVIR & RILPIVIRINE ER 600 & 900 MG/3ML IM SUER
1.0000 | Freq: Once | INTRAMUSCULAR | Status: AC
  Administered 2023-04-12: 1 via INTRAMUSCULAR

## 2023-04-12 NOTE — Patient Instructions (Signed)
You are here to get cabenuva injection- need to come for labs 1 week  ( May 9th)  before the 2 nd shot which will be due May 16,

## 2023-04-12 NOTE — Progress Notes (Signed)
NAME: Albert Wong  DOB: [birth year]  MRN: [id number]  Date/Time: 04/12/2023 11:36 AM   Subjective:  Albert Wong is a 67 y.o. with a history of HIV disease diagnosed in early 35s , Treated hepatitis C , COPD, , on MS contin for back pain ( pain management by Dr.Crisp), HTN, hyperlipidemia Pt is here for follow up for HIV.  He wants to start intramuscular Innova injection for HIV.  He has been on Triumeq.  He initiated the change in the medicine from pill l to injectable form as he wants to avoid any pill form medication possible   He says he stopped many meds and now only takes morphine, baclofen, Amitiza and inhalers  his PCP Dr.Tejan Sie   Last Vl 30  and cd4 >500  Patient has not been sexually active in 15 years.  He says he is going on a date next week  Visit from 11/22/21 hospitalized between 09/16/21-09/17/21 with sob on extertion for 3 days which he attributed to switching HIV meds- He was on Triumeq which is abacavir, epivir and dolutegravir to Dovato which is epivir + dolutegravir without abacavir In the ED he had a n increase in troponin to 531 , BNP 48, BP 115/71, CXR neg Had CT angio Mild to moderate nonobstructive coronary artery disease. 2.  Moderately reduced LV systolic function with wall motion abnormality suggestive of stress-induced cardiomyopathy.  Mildly elevated left ventricular end-diastolic pressure. Medical management for non obstructive CAD was recommended. EF was 40-45% Amlodipine was switched to metoprolol Marcelline Deist was added . He is doing better until he smoked a cigarette and became very sob  Says he almost went blind with a medicine which his PCP asked him to stop- He does not know what the pill was and what he was taking it .    06/01/21  HIV diagnosed  early 76s., says it was a routine blood test, - was in care at [facility] for many years and then went to see Dr.Fitzgerald in 2014-2019, then [facility]  Nadir Cd4 DK- on  08/14/1991 was 440 VL  OI -None  HAARt  history First regimen  Azt based Atripla Now on Triumeq 100% adherent to HAART-   Acquired thru sex with men Not sexually active in 20 years  Genotype unknown ? Past Medical History:  Diagnosis Date   Anxiety    Chronic nausea    COPD (chronic obstructive pulmonary disease)    Degenerative disk disease    HIV (human immunodeficiency virus infection)    HIV (human immunodeficiency virus infection)    Insomnia    Muscle spasm    Rotator cuff tear     Past Surgical History:  Procedure Laterality Date   dental procedure N/A    LEFT HEART CATH AND CORONARY ANGIOGRAPHY N/A 09/16/2021   Procedure: LEFT HEART CATH AND CORONARY ANGIOGRAPHY;  Surgeon: Iran Ouch, MD;  Location: [facility];  Service: Cardiovascular;  Laterality: N/A;    Social History   Socioeconomic History   Marital status: Single    Spouse name: Not on file   Number of children: Not on file   Years of education: Not on file   Highest education level: Not on file  Occupational History   Not on file  Tobacco Use   Smoking status: Former    Packs/day: 1.00    Years: 39.00    Additional pack years: 0.00    Total pack years: 39.00    Types: Cigarettes  Smokeless tobacco: Never   Tobacco comments:    Quit smoking 1 month ago by his report.  Vaping Use   Vaping Use: Former   Devices: tried when they first came out for about 2 weeks  Substance and Sexual Activity   Alcohol use: Not Currently   Drug use: No   Sexual activity: Not on file  Other Topics Concern   Not on file  Social History Narrative   Not on file   Social Determinants of Health   Financial Resource Strain: Not on file  Food Insecurity: No Food Insecurity (12/02/2022)   Hunger Vital Sign    Worried About Running Out of Food in the Last Year: Never true    Ran Out of Food in the Last Year: Never true  Transportation Needs: No Transportation Needs (12/02/2022)   PRAPARE - Administrator, Civil Service  (Medical): No    Lack of Transportation (Non-Medical): No  Physical Activity: Not on file  Stress: Not on file  Social Connections: Not on file  Intimate Partner Violence: Not At Risk (12/02/2022)   Humiliation, Afraid, Rape, and Kick questionnaire    Fear of Current or Ex-Partner: No    Emotionally Abused: No    Physically Abused: No    Sexually Abused: No    Family History  Problem Relation Age of Onset   Alzheimer's disease Mother    Breast cancer Mother    Heart disease Father   Mother breast cancer Allergies  Allergen Reactions   Dolutegravir-Lamivudine Other (See Comments)    Severe Flu like symptoms  Sweating   Shortness of breath  Body aches-- Doubt this is Dovato allergy as he is tolerating truimeq which is dovato without abacavir  Other Reaction(s): Other (See Comments)  Severe Flu like symptoms Sweating  Shortness of breath Body aches-- Doubt this is Dovato allergy as he is tolerating truimeq which is dovato + abacavir   Entresto [Sacubitril-Valsartan] Photosensitivity    Dizziness   Metoprolol Succinate [Metoprolol] Photosensitivity    Patients reported change in vision. D/c by pt pcp   ? Current Outpatient Medications  Medication Sig Dispense Refill   albuterol (PROVENTIL) (2.5 MG/3ML) 0.083% nebulizer solution Take by nebulization.     aspirin EC 81 MG EC tablet Take 1 tablet (81 mg total) by mouth daily. Swallow whole. 30 tablet 2   baclofen (LIORESAL) 10 MG tablet TAKE 1 TABLET BY MOUTH EVERY DAY 30 tablet 11   BREZTRI AEROSPHERE 160-9-4.8 MCG/ACT AERO Inhale 2 puffs into the lungs 2 (two) times daily.     budesonide-formoterol (SYMBICORT) 80-4.5 MCG/ACT inhaler Inhale 2 puffs into the lungs 2 (two) times daily as needed.     cabotegravir & rilpivirine ER (CABENUVA) 600 & 900 MG/3ML injection Inject 1 kit into the muscle every 2 (two) months. 6 mL 5   cabotegravir & rilpivirine ER (CABENUVA) 600 & 900 MG/3ML injection Inject 1 kit into the muscle every 30  (thirty) days. 6 mL 1   cromolyn (OPTICROM) 4 % ophthalmic solution      dicyclomine (BENTYL) 20 MG tablet Take 20 mg by mouth in the morning and at bedtime.     fluorometholone (FML) 0.1 % ophthalmic suspension SMARTSIG:In Eye(s)     Ipratropium-Albuterol (COMBIVENT) 20-100 MCG/ACT AERS respimat Inhale into the lungs as directed.     ketoconazole (NIZORAL) 2 % cream Apply 1 application topically daily as needed for irritation.     lubiprostone (AMITIZA) 24 MCG capsule Take 24 mcg  by mouth 2 (two) times daily.     morphine (MS CONTIN) 30 MG 12 hr tablet Take 1 tablet (30 mg total) by mouth 2 (two) times daily. 60 tablet 0   mupirocin ointment (BACTROBAN) 2 % SMARTSIG:1 Sparingly Topical Twice Daily PRN     triamcinolone cream (KENALOG) 0.1 % Apply 1 Application topically 3 (three) times daily.     VITAMIN D PO Take 1,000 Units by mouth daily.     No current facility-administered medications for this visit.    REVIEW OF SYSTEMS:  Const: negative fever, negative chills, fluctuating weight- when he was taking care of his mother he weighed 129 pounds- After she passed away he was 180. He also was on marinol for PTSD.now he is around 133 and his Marinol has not been continued ENT: negative coryza, negative sore throat Resp: has copd  Cards: negative for chest pain, palpitations, lower extremity edema GU: negative for frequency, dysuria and hematuria Skin: negative for rash and pruritus Heme: negative for easy bruising and gum/nose bleeding MS: back pain better Neurolo:negative for headaches, dizziness, vertigo, memory problems  Psych: PTSD  Objective:  VITALS:  BP (!) 162/72   Pulse 81   Temp (!) 97 F (36.1 C) (Temporal)   Ht  (1.778 m)   Wt 135 lb (61.2 kg)   BMI 19.37 kg/m  PHYSICAL EXAM:  General: Alert, cooperative, no distress, appears stated age.  Head: Normocephalic, without obvious abnormality, atraumatic. Eyes: Conjunctivae clear, anicteric sclerae. Pupils are  equal Full set of dentures Neck: Supple, symmetrical, no adenopathy, thyroid: non tender no carotid bruit and no JVD.  Lungs bilateral air entry  heart: Regular rate and rhythm, no murmur, rub or gallop. Abdomen: Did not examine Extremities: Extremities normal, atraumatic, no cyanosis. No edema. No clubbing Skin: dry skin Lymph: Cervical, supraclavicular normal. Neurologic: Grossly non-focal IMAGING RESULTS: Health maintenance Vaccination  Vaccine Date last given comment  Influenza    Hepatitis B Has antibodies   Hepatitis A    Prevnar-PCV-13 ?   Pneumovac-PPSV-23 2020   TdaP 2021   HPV    Shingrix ( zoster vaccine)     ______________________  Labs Lab Result  Date comment  HIV VL <20 06/21/21   CD4 527 06/21/21   Genotype     [id number] NR    HIV antibody     RPR NR 06/21/21   Quantiferon Gold Negative    Hep C ab reactive    Hepatitis B-ab,ag,c Sab > 12.7    Hepatitis A-IgM, IgG /T Total-pos    Lipid TC 138, LDL 90, TGL 61, HDL 36    GC/CHL          HB,PLT,Cr, LFT 13.7, Plt 205      Preventive  Procedure Result  Date comment  colonoscopy  2018        Dental exam     Opthal       Impression/Recommendation ? ?HIV disease- he is well controlled- Vl < 20 and cd4 is 566 on triumeq which is a fixed drug combination of abacavir, 3TC, dolutegravir.  He was to take the injectable form -cabenuva which is cabotegravir +rilpavarine This is given every month for 2 doses and after that is every other side effects have been explained to him by myself and the pharmacist at [facility] a few months ago  HTN on amlodipine- losartan not sure whether he is taking these medications HLD- on rosuvastatin- he stopped because he was losing weight and  does not want to take it anymore  Not sexually active in 15 yrs and does not want anal pap or STD screen now.   Treated Hepatits C- HCV RNA not detected  COPD- on inhalers, not on  home oxygen followed by  Dr.Aleskerov   LBA- on MS contin Pain  management Dr.Crisp at [facility]?    ________________________________________________ Follow up 1 month for the second dose

## 2023-04-18 ENCOUNTER — Telehealth: Payer: Self-pay

## 2023-04-18 NOTE — Telephone Encounter (Signed)
Patient called office to discuss side effects of cabenuva. States that he is having pain at injection site from last visit. Would like to know if this is normal.  Informed patient that this is side effect of cab injections. Denies increase pain, fever, redness at site. Advised he try to stay active to help muscles move, use warm compress, and take tylenol/ ibuprofen every four hours as needed.  Verbalized understanding. Will call office back with any questions/ concerns. Juanita Laster, RMA

## 2023-04-27 ENCOUNTER — Other Ambulatory Visit (HOSPITAL_COMMUNITY): Payer: Self-pay

## 2023-05-01 ENCOUNTER — Other Ambulatory Visit: Payer: Self-pay

## 2023-05-02 ENCOUNTER — Other Ambulatory Visit
Admission: RE | Admit: 2023-05-02 | Discharge: 2023-05-02 | Disposition: A | Discharge status: Home / Self Care | Payer: 59 | Attending: Infectious Diseases | Admitting: Infectious Diseases

## 2023-05-02 DIAGNOSIS — B20 Human immunodeficiency virus [HIV] disease: Secondary | ICD-10-CM | POA: Diagnosis present

## 2023-05-03 LAB — HIV-1 RNA QUANT-NO REFLEX-BLD
HIV 1 RNA Quant: <20 copies/mL
LOG10 HIV-1 RNA: UNDETERMINED log10copy/mL

## 2023-05-10 ENCOUNTER — Other Ambulatory Visit (HOSPITAL_COMMUNITY): Payer: Self-pay

## 2023-05-10 ENCOUNTER — Ambulatory Visit: Payer: 59 | Attending: Infectious Diseases | Admitting: Infectious Diseases

## 2023-05-10 VITALS — BP 145/80 | HR 93 | Temp 96.8°F | Wt 141.0 lb

## 2023-05-10 DIAGNOSIS — Z21 Asymptomatic human immunodeficiency virus [HIV] infection status: Secondary | ICD-10-CM | POA: Insufficient documentation

## 2023-05-10 DIAGNOSIS — J449 Chronic obstructive pulmonary disease, unspecified: Secondary | ICD-10-CM | POA: Insufficient documentation

## 2023-05-10 DIAGNOSIS — E785 Hyperlipidemia, unspecified: Secondary | ICD-10-CM | POA: Insufficient documentation

## 2023-05-10 DIAGNOSIS — Z8249 Family history of ischemic heart disease and other diseases of the circulatory system: Secondary | ICD-10-CM | POA: Diagnosis not present

## 2023-05-10 DIAGNOSIS — Z87891 Personal history of nicotine dependence: Secondary | ICD-10-CM | POA: Diagnosis not present

## 2023-05-10 DIAGNOSIS — Z79899 Other long term (current) drug therapy: Secondary | ICD-10-CM | POA: Insufficient documentation

## 2023-05-10 DIAGNOSIS — M545 Low back pain, unspecified: Secondary | ICD-10-CM | POA: Diagnosis not present

## 2023-05-10 DIAGNOSIS — I10 Essential (primary) hypertension: Secondary | ICD-10-CM | POA: Diagnosis not present

## 2023-05-10 DIAGNOSIS — B20 Human immunodeficiency virus [HIV] disease: Secondary | ICD-10-CM

## 2023-05-10 MED ORDER — CABOTEGRAVIR & RILPIVIRINE ER 600 & 900 MG/3ML IM SUER
1.0000 | Freq: Once | INTRAMUSCULAR | Status: AC
  Administered 2023-05-10: 1 via INTRAMUSCULAR

## 2023-05-10 NOTE — Progress Notes (Signed)
NAME: Albert Wong  DOB: 08-14-1956  MRN: [id number]  Date/Time: 05/10/2023 12:01 PM   Subjective:  Albert Wong is a 67 y.o.male  with a history of HIV disease diagnosed in early 80s , Treated hepatitis C , COPD, , on MS contin for back pain ( pain management by Dr.Crisp), HTN, hyperlipidemia Pt is here for follow up for HIV.  He took his first dose of cabenuva IM on 4/18- did well- vl done on 5/8 is < 20 Today he is getting 2nd dose No side effects except first day felt heavy in his head   He says he stopped many meds and now only takes morphine, baclofen, Amitiza and inhalers  his PCP Dr.Tejan Sie     Patient has not been sexually active in 15 years.   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 2.  Moderately reduced LV systolic function with wall motion abnormality suggestive of stress-induced cardiomyopathy.  Mildly elevated left ventricular end-diastolic pressure. Medical management for non obstructive CAD was recommended. EF was 40-45% Amlodipine was switched to metoprolol Marcelline Deist was added . He is doing better until he smoked a cigarette and became very sob  Says he almost went blind with a medicine which his PCP asked him to stop- He does not know what the pill was and what he was taking it .    06/01/21  HIV diagnosed  early 2s., says it was a routine blood test, - was in care at [facility] for many years and then went to see Dr.Fitzgerald in 2014-2019, then [facility]  Nadir Cd4 DK- on  08/14/1991 was 440 VL  OI -None  HAARt history First regimen  Azt based Atripla Now on Triumeq 100% adherent to HAART-   Acquired thru sex with  men Not sexually active in 20 years  Genotype unknown ? Past Medical History:  Diagnosis Date   Anxiety    Chronic nausea    COPD (chronic obstructive pulmonary disease) (HCC)    Degenerative disk disease    HIV (human immunodeficiency virus infection) (HCC)    HIV (human immunodeficiency virus infection) (HCC)    Insomnia    Muscle spasm    Rotator cuff tear     Past Surgical History:  Procedure Laterality Date   dental procedure N/A    LEFT HEART CATH AND CORONARY ANGIOGRAPHY N/A 09/16/2021   Procedure: LEFT HEART CATH AND CORONARY ANGIOGRAPHY;  Surgeon: Iran Ouch, MD;  Location: [facility] INVASIVE CV LAB;  Service: Cardiovascular;  Laterality: N/A;    Social History   Socioeconomic History   Marital status: Single    Spouse name: Not on file   Number of children: Not on file   Years of education: Not on file   Highest education level: Not on file  Occupational History   Not on file  Tobacco Use   Smoking status: Former    Packs/day: 1.00    Years: 39.00    Additional pack years: 0.00    Total pack years: 39.00    Types: Cigarettes   Smokeless tobacco: Never   Tobacco comments:    Quit smoking 1 month ago  by his report.  Vaping Use   Vaping Use: Former   Devices: tried when they first came out for about 2 weeks  Substance and Sexual Activity   Alcohol use: Not Currently   Drug use: No   Sexual activity: Not on file  Other Topics Concern   Not on file  Social History Narrative   Not on file   Social Determinants of Health   Financial Resource Strain: Not on file  Food Insecurity: No Food Insecurity (12/02/2022)   Hunger Vital Sign    Worried About Running Out of Food in the Last Year: Never true    Ran Out of Food in the Last Year: Never true  Transportation Needs: No Transportation Needs (12/02/2022)   PRAPARE - Administrator, Civil Service (Medical): No    Lack of Transportation (Non-Medical): No  Physical Activity: Not on file  Stress:  Not on file  Social Connections: Not on file  Intimate Partner Violence: Not At Risk (12/02/2022)   Humiliation, Afraid, Rape, and Kick questionnaire    Fear of Current or Ex-Partner: No    Emotionally Abused: No    Physically Abused: No    Sexually Abused: No    Family History  Problem Relation Age of Onset   Alzheimer's disease Mother    Breast cancer Mother    Heart disease Father   Mother breast cancer Allergies  Allergen Reactions   Dolutegravir-Lamivudine Other (See Comments)    Severe Flu like symptoms  Sweating   Shortness of breath  Body aches-- Doubt this is Dovato allergy as he is tolerating truimeq which is dovato without abacavir  Other Reaction(s): Other (See Comments)  Severe Flu like symptoms Sweating  Shortness of breath Body aches-- Doubt this is Dovato allergy as he is tolerating truimeq which is dovato + abacavir   Entresto [Sacubitril-Valsartan] Photosensitivity    Dizziness   Metoprolol Succinate [Metoprolol] Photosensitivity    Patients reported change in vision. D/c by pt pcp   ? Current Outpatient Medications  Medication Sig Dispense Refill   albuterol (PROVENTIL) (2.5 MG/3ML) 0.083% nebulizer solution Take by nebulization.     amLODipine (NORVASC) 2.5 MG tablet Take 2.5 mg by mouth daily.     aspirin EC 81 MG EC tablet Take 1 tablet (81 mg total) by mouth daily. Swallow whole. 30 tablet 2   baclofen (LIORESAL) 10 MG tablet TAKE 1 TABLET BY MOUTH EVERY DAY 30 tablet 11   BREZTRI AEROSPHERE 160-9-4.8 MCG/ACT AERO Inhale 2 puffs into the lungs 2 (two) times daily.     budesonide-formoterol (SYMBICORT) 80-4.5 MCG/ACT inhaler Inhale 2 puffs into the lungs 2 (two) times daily as needed.     cabotegravir & rilpivirine ER (CABENUVA) 600 & 900 MG/3ML injection Inject 1 kit into the muscle every 2 (two) months. 6 mL 5   cabotegravir & rilpivirine ER (CABENUVA) 600 & 900 MG/3ML injection Inject 1 kit into the muscle every 30 (thirty) days. 6 mL 1   cromolyn  (OPTICROM) 4 % ophthalmic solution      dicyclomine (BENTYL) 20 MG tablet Take 20 mg by mouth in the morning and at bedtime.     fluorometholone (FML) 0.1 % ophthalmic suspension SMARTSIG:In Eye(s)     Ipratropium-Albuterol (COMBIVENT) 20-100 MCG/ACT AERS respimat Inhale into the lungs as directed.     ketoconazole (NIZORAL) 2 % cream Apply 1 application topically daily as needed for irritation.     lubiprostone (AMITIZA) 24 MCG capsule Take 24 mcg  by mouth 2 (two) times daily.     morphine (MS CONTIN) 30 MG 12 hr tablet Take 1 tablet (30 mg total) by mouth 2 (two) times daily. 60 tablet 0   mupirocin ointment (BACTROBAN) 2 % SMARTSIG:1 Sparingly Topical Twice Daily PRN     spironolactone (ALDACTONE) 25 MG tablet Take 25 mg by mouth 2 (two) times daily.     triamcinolone cream (KENALOG) 0.1 % Apply 1 Application topically 3 (three) times daily.     VITAMIN D PO Take 1,000 Units by mouth daily.     No current facility-administered medications for this visit.    REVIEW OF SYSTEMS:  Const: negative fever, negative chills, fluctuating weight- when he was taking care of his mother he weighed 129 pounds- After she passed away he was 180. He also was on marinol for PTSD.now he is around 133 and his Marinol has not been continued ENT: negative coryza, negative sore throat Resp: has copd  Cards: negative for chest pain, palpitations, lower extremity edema GU: negative for frequency, dysuria and hematuria Skin: negative for rash and pruritus Heme: negative for easy bruising and gum/nose bleeding MS: back pain better Neurolo:negative for headaches, dizziness, vertigo, memory problems  Psych: PTSD  Objective:  VITALS:  BP (!) 145/80   Pulse 93   Temp (!) 96.8 F (36 C) (Temporal)   Wt 141 lb (64 kg)   BMI 20.23 kg/m  PHYSICAL EXAM:  General: Alert, cooperative, no distress, appears stated age.  Head: Normocephalic, without obvious abnormality, atraumatic. Eyes: Conjunctivae clear, anicteric  sclerae. Pupils are equal Full set of dentures Neck: Supple, symmetrical, no adenopathy, thyroid: non tender no carotid bruit and no JVD. Lungs bilateral air entry  heart: Regular rate and rhythm, no murmur, rub or gallop. Abdomen: Did not examine Extremities: Extremities normal, atraumatic, no cyanosis. No edema. No clubbing Skin: dry skin Lymph: Cervical, supraclavicular normal. Neurologic: Grossly non-focal IMAGING RESULTS: Health maintenance Vaccination  Vaccine Date last given comment  Influenza    Hepatitis B Has antibodies   Hepatitis A    Prevnar-PCV-13 ?   Pneumovac-PPSV-23 2020   TdaP 2021   HPV    Shingrix ( zoster vaccine)     ______________________  Labs Lab Result  Date comment  HIV VL <20 06/21/21   CD4 527 06/21/21   Genotype     [id number] NR    HIV antibody     RPR NR 06/21/21   Quantiferon Gold Negative    Hep C ab reactive    Hepatitis B-ab,ag,c Sab > 12.7    Hepatitis A-IgM, IgG /T Total-pos    Lipid TC 138, LDL 90, TGL 61, HDL 36    GC/CHL          HB,PLT,Cr, LFT 13.7, Plt 205      Preventive  Procedure Result  Date comment  colonoscopy  2018        Dental exam     Opthal       Impression/Recommendation ? ?HIV disease- he is well controlled- Vl < 20 and cd4 is 566 on injectable cabenuva Doing well  Smoker-quit a few months ago  HTN on amlodipine- losartan not sure whether he is taking these medications HLD- on rosuvastatin- he stopped because he was losing weight and does not want to take it anymore  Not sexually active in 15 yrs and does not want anal pap or STD screen now.   Treated Hepatits C- HCV RNA not detected  COPD- on inhalers, not  on  home oxygen followed by Dr.Aleskerov   LBA- on MS contin Pain  management Dr.Crisp at [facility]?    ________________________________________________ Follow up 2 months- labs will be done then

## 2023-05-22 ENCOUNTER — Other Ambulatory Visit: Payer: Self-pay | Admitting: Cardiovascular Disease

## 2023-05-22 NOTE — Telephone Encounter (Signed)
Last visit 09/12/22 advised 2 week f/u cx 03/15/23 with reason: Patient (will reschedule later)  med was dc at ID appt on 03/01/23.  Tried calling patient to confirm if he is taking medication but did not get an answer.    Spoke with [facility] and confirmed last filled on 02/05/23.  Pharmacy has the medication (#90)  ready for pick up today.

## 2023-05-22 NOTE — Telephone Encounter (Signed)
Patient is overdue for f/u.  Please contact to schedule f/u prior to further refills.

## 2023-06-21 ENCOUNTER — Other Ambulatory Visit: Payer: Self-pay

## 2023-06-21 DIAGNOSIS — Z79899 Other long term (current) drug therapy: Secondary | ICD-10-CM

## 2023-06-21 DIAGNOSIS — B20 Human immunodeficiency virus [HIV] disease: Secondary | ICD-10-CM

## 2023-06-25 NOTE — Telephone Encounter (Signed)
Left voicemail to call and schedule follow up appointment.

## 2023-06-26 ENCOUNTER — Other Ambulatory Visit (HOSPITAL_COMMUNITY): Payer: Self-pay

## 2023-06-29 ENCOUNTER — Other Ambulatory Visit: Payer: Self-pay

## 2023-07-05 ENCOUNTER — Other Ambulatory Visit
Admission: RE | Admit: 2023-07-05 | Discharge: 2023-07-05 | Disposition: A | Discharge status: Home / Self Care | Payer: 59 | Attending: Infectious Diseases | Admitting: Infectious Diseases

## 2023-07-05 DIAGNOSIS — B20 Human immunodeficiency virus [HIV] disease: Secondary | ICD-10-CM | POA: Insufficient documentation

## 2023-07-05 LAB — COMPREHENSIVE METABOLIC PANEL
ALT: 9 U/L (ref 0–44)
AST: 23 U/L (ref 15–41)
Albumin: 4.6 g/dL (ref 3.5–5.0)
Alkaline Phosphatase: 38 U/L (ref 38–126)
Anion gap: 9 (ref 5–15)
BUN: 10 mg/dL (ref 8–23)
CO2: 29 mmol/L (ref 22–32)
Calcium: 9.5 mg/dL (ref 8.9–10.3)
Chloride: 100 mmol/L (ref 98–111)
Creatinine, Ser: 0.89 mg/dL (ref 0.61–1.24)
GFR, Estimated: >60 mL/min (ref >60)
Glucose, Bld: 126 mg/dL — ABNORMAL HIGH (ref 70–99)
Potassium: 4 mmol/L (ref 3.5–5.1)
Sodium: 138 mmol/L (ref 135–145)
Total Bilirubin: 0.9 mg/dL (ref 0.3–1.2)
Total Protein: 7.4 g/dL (ref 6.5–8.1)

## 2023-07-05 LAB — CBC WITH DIFFERENTIAL/PLATELET
Abs Immature Granulocytes: 0.02 10*3/uL (ref 0.00–0.07)
Basophils Absolute: 0.1 10*3/uL (ref 0.0–0.1)
Basophils Relative: 1 %
Eosinophils Absolute: 0.2 10*3/uL (ref 0.0–0.5)
Eosinophils Relative: 3 %
HCT: 49.8 % (ref 39.0–52.0)
Hemoglobin: 16.7 g/dL (ref 13.0–17.0)
Immature Granulocytes: 0 %
Lymphocytes Relative: 19 %
Lymphs Abs: 1.4 10*3/uL (ref 0.7–4.0)
MCH: 31.8 pg (ref 26.0–34.0)
MCHC: 33.5 g/dL (ref 30.0–36.0)
MCV: 94.9 fL (ref 80.0–100.0)
Monocytes Absolute: 0.6 10*3/uL (ref 0.1–1.0)
Monocytes Relative: 7 %
Neutro Abs: 5.2 10*3/uL (ref 1.7–7.7)
Neutrophils Relative %: 70 %
Platelets: 279 10*3/uL (ref 150–400)
RBC: 5.25 MIL/uL (ref 4.22–5.81)
RDW: 11 % — ABNORMAL LOW (ref 11.5–15.5)
WBC: 7.4 10*3/uL (ref 4.0–10.5)
nRBC: 0 % (ref 0.0–0.2)

## 2023-07-05 NOTE — Addendum Note (Signed)
Addended by: Lonell Face C on: 07/05/2023 09:07 AM   Modules accepted: Orders

## 2023-07-06 LAB — T-HELPER CELL (CD4) - (RCID CLINIC ONLY)
CD4 % Helper T Cell: 30 % — ABNORMAL LOW (ref 33–65)
CD4 T Cell Abs: 367 /uL — ABNORMAL LOW (ref 400–1790)

## 2023-07-06 LAB — HIV-1 RNA QUANT-NO REFLEX-BLD
HIV 1 RNA Quant: <20 copies/mL
LOG10 HIV-1 RNA: UNDETERMINED log10copy/mL

## 2023-07-10 ENCOUNTER — Ambulatory Visit: Payer: 59 | Attending: Infectious Diseases | Admitting: Infectious Diseases

## 2023-07-10 ENCOUNTER — Emergency Department: Payer: 59

## 2023-07-10 ENCOUNTER — Encounter: Payer: Self-pay | Admitting: Infectious Diseases

## 2023-07-10 ENCOUNTER — Other Ambulatory Visit: Payer: Self-pay

## 2023-07-10 ENCOUNTER — Other Ambulatory Visit (HOSPITAL_COMMUNITY): Payer: Self-pay

## 2023-07-10 ENCOUNTER — Emergency Department
Admission: EM | Admit: 2023-07-10 | Discharge: 2023-07-10 | Disposition: A | Discharge status: Home / Self Care | Payer: 59 | Attending: Emergency Medicine | Admitting: Emergency Medicine

## 2023-07-10 VITALS — BP 163/77 | HR 88 | Temp 97.7°F | Ht 70.0 in | Wt 154.0 lb

## 2023-07-10 DIAGNOSIS — Z1152 Encounter for screening for COVID-19: Secondary | ICD-10-CM | POA: Insufficient documentation

## 2023-07-10 DIAGNOSIS — J449 Chronic obstructive pulmonary disease, unspecified: Secondary | ICD-10-CM | POA: Insufficient documentation

## 2023-07-10 DIAGNOSIS — E785 Hyperlipidemia, unspecified: Secondary | ICD-10-CM | POA: Diagnosis not present

## 2023-07-10 DIAGNOSIS — F17211 Nicotine dependence, cigarettes, in remission: Secondary | ICD-10-CM

## 2023-07-10 DIAGNOSIS — Z87891 Personal history of nicotine dependence: Secondary | ICD-10-CM | POA: Insufficient documentation

## 2023-07-10 DIAGNOSIS — R0602 Shortness of breath: Secondary | ICD-10-CM | POA: Diagnosis present

## 2023-07-10 DIAGNOSIS — I10 Essential (primary) hypertension: Secondary | ICD-10-CM | POA: Insufficient documentation

## 2023-07-10 DIAGNOSIS — J441 Chronic obstructive pulmonary disease with (acute) exacerbation: Secondary | ICD-10-CM | POA: Insufficient documentation

## 2023-07-10 DIAGNOSIS — Z21 Asymptomatic human immunodeficiency virus [HIV] infection status: Secondary | ICD-10-CM | POA: Insufficient documentation

## 2023-07-10 DIAGNOSIS — Z79899 Other long term (current) drug therapy: Secondary | ICD-10-CM | POA: Insufficient documentation

## 2023-07-10 DIAGNOSIS — B20 Human immunodeficiency virus [HIV] disease: Secondary | ICD-10-CM | POA: Diagnosis not present

## 2023-07-10 DIAGNOSIS — D72829 Elevated white blood cell count, unspecified: Secondary | ICD-10-CM | POA: Insufficient documentation

## 2023-07-10 LAB — BASIC METABOLIC PANEL
Anion gap: 11 (ref 5–15)
BUN: 12 mg/dL (ref 8–23)
CO2: 24 mmol/L (ref 22–32)
Calcium: 9 mg/dL (ref 8.9–10.3)
Chloride: 100 mmol/L (ref 98–111)
Creatinine, Ser: 0.92 mg/dL (ref 0.61–1.24)
GFR, Estimated: >60 mL/min (ref >60)
Glucose, Bld: 128 mg/dL — ABNORMAL HIGH (ref 70–99)
Potassium: 3.9 mmol/L (ref 3.5–5.1)
Sodium: 135 mmol/L (ref 135–145)

## 2023-07-10 LAB — CBC WITH DIFFERENTIAL/PLATELET
Abs Immature Granulocytes: 0.06 10*3/uL (ref 0.00–0.07)
Basophils Absolute: 0.1 10*3/uL (ref 0.0–0.1)
Basophils Relative: 1 %
Eosinophils Absolute: 0.3 10*3/uL (ref 0.0–0.5)
Eosinophils Relative: 2 %
HCT: 45 % (ref 39.0–52.0)
Hemoglobin: 15 g/dL (ref 13.0–17.0)
Immature Granulocytes: 1 %
Lymphocytes Relative: 16 %
Lymphs Abs: 2.1 10*3/uL (ref 0.7–4.0)
MCH: 31.8 pg (ref 26.0–34.0)
MCHC: 33.3 g/dL (ref 30.0–36.0)
MCV: 95.5 fL (ref 80.0–100.0)
Monocytes Absolute: 1.1 10*3/uL — ABNORMAL HIGH (ref 0.1–1.0)
Monocytes Relative: 9 %
Neutro Abs: 9.3 10*3/uL — ABNORMAL HIGH (ref 1.7–7.7)
Neutrophils Relative %: 71 %
Platelets: 275 10*3/uL (ref 150–400)
RBC: 4.71 MIL/uL (ref 4.22–5.81)
RDW: 11.1 % — ABNORMAL LOW (ref 11.5–15.5)
WBC: 13 10*3/uL — ABNORMAL HIGH (ref 4.0–10.5)
nRBC: 0 % (ref 0.0–0.2)

## 2023-07-10 LAB — RESP PANEL BY RT-PCR (RSV, FLU A&B, COVID)  RVPGX2
Influenza A by PCR: NEGATIVE
Influenza B by PCR: NEGATIVE
Resp Syncytial Virus by PCR: NEGATIVE
SARS Coronavirus 2 by RT PCR: NEGATIVE

## 2023-07-10 LAB — TROPONIN I (HIGH SENSITIVITY): Troponin I (High Sensitivity): 6 ng/L (ref <18)

## 2023-07-10 MED ORDER — IPRATROPIUM-ALBUTEROL 0.5-2.5 (3) MG/3ML IN SOLN
6.0000 mL | Freq: Once | RESPIRATORY_TRACT | Status: AC
  Administered 2023-07-10: 6 mL via RESPIRATORY_TRACT
  Filled 2023-07-10: qty 6

## 2023-07-10 MED ORDER — ALBUTEROL SULFATE HFA 108 (90 BASE) MCG/ACT IN AERS: 2.0000 | INHALATION_SPRAY | Freq: Four times a day (QID) | RESPIRATORY_TRACT | 0 refills | Status: AC | PRN

## 2023-07-10 MED ORDER — PREDNISONE 20 MG PO TABS: 60.0000 mg | ORAL_TABLET | Freq: Every day | ORAL | 0 refills | Status: AC

## 2023-07-10 MED ORDER — DOXYCYCLINE HYCLATE 100 MG PO CAPS: 100.0000 mg | ORAL_CAPSULE | Freq: Two times a day (BID) | ORAL | 0 refills | Status: AC

## 2023-07-10 MED ORDER — CABOTEGRAVIR & RILPIVIRINE ER 600 & 900 MG/3ML IM SUER
1.0000 | Freq: Once | INTRAMUSCULAR | Status: AC
Start: 2023-07-10 — End: 2023-07-10
  Administered 2023-07-10: 1 via INTRAMUSCULAR

## 2023-07-10 NOTE — ED Notes (Signed)
Pt A&O x4, no obvious distress noted, respirations regular/unlabored. Pt verbalizes understanding of discharge instructions. Pt wheeled out of department without incident.

## 2023-07-10 NOTE — ED Triage Notes (Signed)
Pt to ED from home, [facility] Difficulty breathing since 1 month. EMS gave breathing tx earlier today, worked then felt SOB again after laid down SOB is worse with exertion and lying supine No hx CHF. Has hx COPD, HTN Expiratory wheezing both lungs @ top per EMS  EMS VS: 94-96% on RA, HR 82, 147/72, 114 CBG, 98.3 T EMS gave 1 duone and 125mg  solu medrol IV  EDP at bedside Pt denies CP, had CP earlier today

## 2023-07-10 NOTE — Progress Notes (Signed)
NAME: Albert Wong  DOB: [birth year]  MRN: [id number]  Date/Time: 07/10/2023 10:46 AM   Subjective:  Albert Wong is a 67 y.o.male  with a history of HIV disease diagnosed in early 80s , Treated hepatitis C , COPD, , on MS contin for back pain ( pain management by Dr.Crisp), HTN, hyperlipidemia Pt is here for follow up for HIV.  He took his first dose of cabenuva IM on 4/18- did well- vl done on 5/8 is < 20 No side effects except first day felt heavy in his head   He says he stopped many meds and now only takes morphine, baclofen, Amitiza and inhalers  his PCP is Dr.Entzminger    Patient has not been sexually active in 15 years.   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 2.  Moderately reduced LV systolic function with wall motion abnormality suggestive of stress-induced cardiomyopathy.  Mildly elevated left ventricular end-diastolic pressure. Medical management for non obstructive CAD was recommended. EF was 40-45% Amlodipine was switched to metoprolol Marcelline Deist was added . He is doing better until he smoked a cigarette and became very sob  Says he almost went blind with a medicine which his PCP asked him to stop- He does not know what the pill was and what he was taking it .    06/01/21  HIV diagnosed  early 22s., says it was a routine blood test, - was in care at [facility] for many years and then went to see Dr.Fitzgerald in 2014-2019, then [facility]  Nadir Cd4 DK- on  08/14/1991 was 440 VL  OI -None  HAARt history First regimen  Azt based Atripla Now on Triumeq 100% adherent to HAART-   Acquired thru sex with men Not sexually active in 20  years  Genotype unknown ? Past Medical History:  Diagnosis Date   Anxiety    Chronic nausea    COPD (chronic obstructive pulmonary disease) (HCC)    Degenerative disk disease    HIV (human immunodeficiency virus infection) (HCC)    HIV (human immunodeficiency virus infection) (HCC)    Insomnia    Muscle spasm    Rotator cuff tear     Past Surgical History:  Procedure Laterality Date   dental procedure N/A    LEFT HEART CATH AND CORONARY ANGIOGRAPHY N/A 09/16/2021   Procedure: LEFT HEART CATH AND CORONARY ANGIOGRAPHY;  Surgeon: Iran Ouch, MD;  Location: [facility] INVASIVE CV LAB;  Service: Cardiovascular;  Laterality: N/A;    Social History   Socioeconomic History   Marital status: Single    Spouse name: Not on file   Number of children: Not on file   Years of education: Not on file   Highest education level: Not on file  Occupational History   Not on file  Tobacco Use   Smoking status: Former    Current packs/day: 1.00    Average packs/day: 1 pack/day for 39.0 years (39.0 ttl pk-yrs)    Types: Cigarettes   Smokeless tobacco: Never   Tobacco comments:    Quit smoking 1 month ago by his report.  Vaping Use   Vaping status: Former  Devices: tried when they first came out for about 2 weeks  Substance and Sexual Activity   Alcohol use: Not Currently   Drug use: No   Sexual activity: Not on file  Other Topics Concern   Not on file  Social History Narrative   Not on file   Social Determinants of Health   Financial Resource Strain: Not on file  Food Insecurity: No Food Insecurity (12/02/2022)   Hunger Vital Sign    Worried About Running Out of Food in the Last Year: Never true    Ran Out of Food in the Last Year: Never true  Transportation Needs: No Transportation Needs (12/02/2022)   PRAPARE - Administrator, Civil Service (Medical): No    Lack of Transportation (Non-Medical): No  Physical Activity: Not on file  Stress: Not on file  Social  Connections: Not on file  Intimate Partner Violence: Not At Risk (12/02/2022)   Humiliation, Afraid, Rape, and Kick questionnaire    Fear of Current or Ex-Partner: No    Emotionally Abused: No    Physically Abused: No    Sexually Abused: No    Family History  Problem Relation Age of Onset   Alzheimer's disease Mother    Breast cancer Mother    Heart disease Father   Mother breast cancer Allergies  Allergen Reactions   Dolutegravir-Lamivudine Other (See Comments)    Severe Flu like symptoms  Sweating   Shortness of breath  Body aches-- Doubt this is Dovato allergy as he is tolerating truimeq which is dovato without abacavir  Other Reaction(s): Other (See Comments)  Severe Flu like symptoms Sweating  Shortness of breath Body aches-- Doubt this is Dovato allergy as he is tolerating truimeq which is dovato + abacavir   Entresto [Sacubitril-Valsartan] Photosensitivity    Dizziness   Metoprolol Succinate [Metoprolol] Photosensitivity    Patients reported change in vision. D/c by pt pcp   ? Current Outpatient Medications  Medication Sig Dispense Refill   albuterol (PROVENTIL) (2.5 MG/3ML) 0.083% nebulizer solution Take by nebulization.     amLODipine (NORVASC) 2.5 MG tablet Take 2.5 mg by mouth daily.     aspirin EC 81 MG EC tablet Take 1 tablet (81 mg total) by mouth daily. Swallow whole. 30 tablet 2   baclofen (LIORESAL) 10 MG tablet TAKE 1 TABLET BY MOUTH EVERY DAY 30 tablet 11   BREZTRI AEROSPHERE 160-9-4.8 MCG/ACT AERO Inhale 2 puffs into the lungs 2 (two) times daily.     budesonide-formoterol (SYMBICORT) 80-4.5 MCG/ACT inhaler Inhale 2 puffs into the lungs 2 (two) times daily as needed.     cabotegravir & rilpivirine ER (CABENUVA) 600 & 900 MG/3ML injection Inject 1 kit into the muscle every 2 (two) months. 6 mL 5   cabotegravir & rilpivirine ER (CABENUVA) 600 & 900 MG/3ML injection Inject 1 kit into the muscle every 30 (thirty) days. 6 mL 1   cromolyn (OPTICROM) 4 %  ophthalmic solution      dicyclomine (BENTYL) 20 MG tablet Take 20 mg by mouth in the morning and at bedtime.     fluorometholone (FML) 0.1 % ophthalmic suspension SMARTSIG:In Eye(s)     Ipratropium-Albuterol (COMBIVENT) 20-100 MCG/ACT AERS respimat Inhale into the lungs as directed.     ketoconazole (NIZORAL) 2 % cream Apply 1 application topically daily as needed for irritation.     lubiprostone (AMITIZA) 24 MCG capsule Take 24 mcg by mouth 2 (two) times daily.     morphine (MS CONTIN)  30 MG 12 hr tablet Take 1 tablet (30 mg total) by mouth 2 (two) times daily. 60 tablet 0   mupirocin ointment (BACTROBAN) 2 % SMARTSIG:1 Sparingly Topical Twice Daily PRN     spironolactone (ALDACTONE) 25 MG tablet Take 25 mg by mouth 2 (two) times daily.     triamcinolone cream (KENALOG) 0.1 % Apply 1 Application topically 3 (three) times daily.     VITAMIN D PO Take 1,000 Units by mouth daily.     No current facility-administered medications for this visit.    REVIEW OF SYSTEMS:  Const: negative fever, negative chills, fluctuating weight- when he was taking care of his mother he weighed 129 pounds- After she passed away he was 180. He also was on marinol for PTSD.now he is around 133 and his Marinol has not been continued ENT: negative coryza, negative sore throat Resp: has copd , some sob due to the weather he says. Using oxygen today Cards: negative for chest pain, palpitations, lower extremity edema GU: negative for frequency, dysuria and hematuria Skin: negative for rash and pruritus Heme: negative for easy bruising and gum/nose bleeding MS: back pain better Neurolo:negative for headaches, dizziness, vertigo, memory problems  Psych: PTSD  Objective:  VITALS: BP (!) 163/77   Pulse 88   Temp 97.7 F (36.5 C) (Temporal)   Ht 5\' 10"  (1.778 m)   Wt 154 lb (69.9 kg)   SpO2 93%   BMI 22.10 kg/m    PHYSICAL EXAM:  General: Alert, cooperative, no distress, appears stated age.  Head: Normocephalic,  without obvious abnormality, atraumatic. Eyes: Conjunctivae clear, anicteric sclerae. Pupils are equal Full set of dentures Neck: Supple, symmetrical, no adenopathy, thyroid: non tender no carotid bruit and no JVD. Lungs bilateral air entry  heart: Regular rate and rhythm, no murmur, rub or gallop. Abdomen: Did not examine Extremities: Extremities normal, atraumatic, no cyanosis. No edema. No clubbing Skin: dry skin Lymph: Cervical, supraclavicular normal. Neurologic: Grossly non-focal IMAGING RESULTS: Health maintenance Vaccination  Vaccine Date last given comment  Influenza    Hepatitis B Has antibodies   Hepatitis A    Prevnar-PCV-13 ?   Pneumovac-PPSV-23 2020   TdaP 2021   HPV    Shingrix ( zoster vaccine)    Prevnar 20 -March 2024 ______________________  Labs Lab Result  Date comment  HIV VL <20 06/21/21   CD4 527 06/21/21   Genotype     [id number] NR    HIV antibody     RPR NR 06/21/21   Quantiferon Gold Negative    Hep C ab reactive    Hepatitis B-ab,ag,c Sab > 12.7    Hepatitis A-IgM, IgG /T Total-pos    Lipid TC 138, LDL 90, TGL 61, HDL 36    GC/CHL          HB,PLT,Cr, LFT 13.7, Plt 205      Preventive  Procedure Result  Date comment  colonoscopy  2018        Dental exam     Opthal       Impression/Recommendation ? ?HIV disease- he is well controlled- Vl < 20 and cd4 is 367 ( 30%) on injectable cabenuva Doing well  Ex Smoker-quit a few months ago  HTN on amlodipine 5mg  - forgot to take it this morning  HLD- on rosuvastatin- he stopped because he was losing weight and does not want to take it anymore  Not sexually active in 16 yrs and does not want anal pap or STD screen  now.   Treated Hepatits C- HCV RNA not detected  COPD- on inhalers, using   oxygen this week because of the heat and humidity followed by Dr.Aleskerov   LBA- on MS contin Pain  management by PCP?    ________________________________________________ Follow up 2  months-

## 2023-07-10 NOTE — ED Provider Notes (Signed)
[facility] Provider Note    Event Date/Time   First MD Initiated Contact with Patient 07/10/23 1640     (approximate)   History   Chief Complaint Shortness of Breath   HPI  Albert Wong is a 67 y.o. male with past medical history of hypertension, hyperlipidemia, COPD, HIV, and chronic pain syndrome who presents to the ED complaining of shortness of breath.  Patient reports that he has been dealing with dyspnea on exertion for about the past month, but symptoms seem to get acutely worse earlier today when he had trouble breathing just sitting up out of bed.  He states he had some sharp discomfort in his chest earlier today, but this has since resolved.  He has been dealing with a cough productive of thick sputum, states that he has been trying to avoid coughing because the sputum gets stuck in the back of his throat.  He has not had any fevers, nausea, vomiting, diarrhea, or abdominal pain.  He has not noticed any pain or swelling in his legs.     Physical Exam   Triage Vital Signs: ED Triage Vitals [07/10/23 1644]  Encounter Vitals Group     BP      Systolic BP Percentile      Diastolic BP Percentile      Pulse      Resp      Temp      Temp src      SpO2      Weight 157 lb 8 oz (71.4 kg)     Height 5\' 7"  (1.702 m)     Head Circumference      Peak Flow      Pain Score 8     Pain Loc      Pain Education      Exclude from Growth Chart     Most recent vital signs: Vitals:   07/10/23 1700 07/10/23 1810  BP: (!) 153/78 116/66  Pulse: 86 90  Resp: 20 14  Temp:    SpO2: 100% 98%    Constitutional: Alert and oriented. Eyes: Conjunctivae are normal. Head: Atraumatic. Nose: No congestion/rhinnorhea. Mouth/Throat: Mucous membranes are moist.  Cardiovascular: Normal rate, regular rhythm. Grossly normal heart sounds.  2+ radial pulses bilaterally. Respiratory: Mildly tachypneic with increased respiratory effort.  No retractions. Lungs with  faint wheezing and poor air movement throughout. Gastrointestinal: Soft and nontender. No distention. Musculoskeletal: No lower extremity tenderness nor edema.  Neurologic:  Normal speech and language. No gross focal neurologic deficits are appreciated.    ED Results / Procedures / Treatments   Labs (all labs ordered are listed, but only abnormal results are displayed) Labs Reviewed  CBC WITH DIFFERENTIAL/PLATELET - Abnormal; Notable for the following components:      Result Value   WBC 13.0 (*)    RDW 11.1 (*)    Neutro Abs 9.3 (*)    Monocytes Absolute 1.1 (*)    All other components within normal limits  BASIC METABOLIC PANEL - Abnormal; Notable for the following components:   Glucose, Bld 128 (*)    All other components within normal limits  RESP PANEL BY RT-PCR (RSV, FLU A&B, COVID)  RVPGX2  TROPONIN I (HIGH SENSITIVITY)  TROPONIN I (HIGH SENSITIVITY)     EKG  ED ECG REPORT I, [name], the attending physician, personally viewed and interpreted this ECG.   Date: 07/10/2023  EKG Time: 16:45  Rate: 99  Rhythm: normal sinus rhythm  Axis: Normal  Intervals:none  ST&T Change: None  RADIOLOGY Chest x-ray reviewed and interpreted by me with no infiltrate, edema, or effusion.  PROCEDURES:  Critical Care performed: No  Procedures   MEDICATIONS ORDERED IN ED: Medications  ipratropium-albuterol (DUONEB) 0.5-2.5 (3) MG/3ML nebulizer solution 6 mL (6 mLs Nebulization Given 07/10/23 1651)     IMPRESSION / MDM / ASSESSMENT AND PLAN / ED COURSE  I reviewed the triage vital signs and the nursing notes.                              67 y.o. male with past medical history of hypertension, hyperlipidemia, COPD, HIV, and chronic pain syndrome presents to the ED complaining of 1 month of dyspnea on exertion, now with difficulty breathing at rest starting today.  Patient's presentation is most consistent with acute presentation with potential threat to life or bodily  function.  Differential diagnosis includes, but is not limited to, ACS, PE, pneumonia, COPD exacerbation, CHF, COVID-19, influenza, pleural effusion, anemia.  Patient nontoxic-appearing and in no acute distress, does have mild tachypnea with increased work of breathing, poor air movement noted throughout.  He was given IV Solu-Medrol and DuoNeb with EMS, reports partial improvement in symptoms.  We will give 2 additional DuoNebs and further assess with chest x-ray, EKG shows no evidence of arrhythmia or ischemia.  Symptoms seem more likely to be infectious than related to PE, additional labs pending at this time.  Labs remarkable for mild leukocytosis with no significant anemia, electrolyte abnormality, or AKI.  Troponin within normal limits and I doubt ACS or PE.  Testing for COVID and influenza is negative, chest x-ray also unremarkable.  On reassessment, patient's breathing is improved and he was able to ambulate without difficulty.  He is appropriate for outpatient management, will prescribe course of steroids and antibiotics given his purulent sputum production.  He was counseled to return to the ED for new or worsening symptoms, patient agrees with plan.      FINAL CLINICAL IMPRESSION(S) / ED DIAGNOSES   Final diagnoses:  COPD exacerbation (HCC)     Rx / DC Orders   ED Discharge Orders          Ordered    predniSONE (DELTASONE) 20 MG tablet  Daily with breakfast        07/10/23 1857    albuterol (VENTOLIN HFA) 108 (90 Base) MCG/ACT inhaler  Every 6 hours PRN       Note to Pharmacy: Please supply with spacer   07/10/23 1857    doxycycline (VIBRAMYCIN) 100 MG capsule  2 times daily        07/10/23 1857             Note:  This document was prepared using Dragon voice recognition software and may include unintentional dictation errors.   [name], MD 07/10/23 [phone]

## 2023-07-10 NOTE — ED Notes (Signed)
Ambulatory O2 sat 94-95%, no distress noted. Patient reports he feels comfortable enough to be discharged. Dr. Larinda Buttery made aware.

## 2023-07-11 ENCOUNTER — Encounter: Payer: Self-pay | Admitting: *Deleted

## 2023-07-19 ENCOUNTER — Ambulatory Visit: Payer: 59 | Admitting: Cardiovascular Disease

## 2023-08-23 ENCOUNTER — Other Ambulatory Visit (HOSPITAL_COMMUNITY): Payer: Self-pay

## 2023-09-10 ENCOUNTER — Other Ambulatory Visit (HOSPITAL_COMMUNITY): Payer: Self-pay

## 2023-09-11 ENCOUNTER — Ambulatory Visit: Payer: 59 | Admitting: Infectious Diseases

## 2023-09-13 ENCOUNTER — Ambulatory Visit: Payer: 59 | Attending: Infectious Diseases | Admitting: Infectious Diseases

## 2023-09-13 ENCOUNTER — Encounter: Payer: Self-pay | Admitting: Infectious Diseases

## 2023-09-13 VITALS — BP 160/82 | HR 97 | Temp 97.4°F | Ht 70.0 in | Wt 158.0 lb

## 2023-09-13 DIAGNOSIS — I10 Essential (primary) hypertension: Secondary | ICD-10-CM | POA: Diagnosis not present

## 2023-09-13 DIAGNOSIS — E785 Hyperlipidemia, unspecified: Secondary | ICD-10-CM | POA: Insufficient documentation

## 2023-09-13 DIAGNOSIS — J449 Chronic obstructive pulmonary disease, unspecified: Secondary | ICD-10-CM | POA: Diagnosis not present

## 2023-09-13 DIAGNOSIS — B20 Human immunodeficiency virus [HIV] disease: Secondary | ICD-10-CM | POA: Diagnosis present

## 2023-09-13 DIAGNOSIS — Z79899 Other long term (current) drug therapy: Secondary | ICD-10-CM | POA: Diagnosis not present

## 2023-09-13 DIAGNOSIS — Z87891 Personal history of nicotine dependence: Secondary | ICD-10-CM | POA: Insufficient documentation

## 2023-09-13 DIAGNOSIS — Z8619 Personal history of other infectious and parasitic diseases: Secondary | ICD-10-CM | POA: Diagnosis not present

## 2023-09-13 MED ORDER — CABOTEGRAVIR & RILPIVIRINE ER 600 & 900 MG/3ML IM SUER
1.0000 | Freq: Once | INTRAMUSCULAR | Status: AC
Start: 2023-09-13 — End: 2023-09-13
  Administered 2023-09-13: 1 via INTRAMUSCULAR

## 2023-09-13 NOTE — Progress Notes (Signed)
NAME: Albert Wong  DOB: 1956/10/07  MRN: [id number]  Date/Time: 09/13/2023 10:28 AM   Subjective:  Albert Wong is a 67 y.o.male  with a history of HIV disease diagnosed in early 80s , Treated hepatitis C , COPD, , on MS contin for back pain HTN, hyperlipidemia Pt is here for follow up for HIV. For cabenuva- doing well- thinks he had a spider bite to his rt buttock He took his first dose of cabenuva IM on 4/18- did well- vl done on 07/05/23  is < 20   He says he stopped many meds and now only takes morphine, baclofen, Amitiza and inhalers  his PCP is Dr.Entzminger    Patient has not been sexually active in 16 years.   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 2.  Moderately reduced LV systolic function with wall motion abnormality suggestive of stress-induced cardiomyopathy.  Mildly elevated left ventricular end-diastolic pressure. Medical management for non obstructive CAD was recommended. EF was 40-45% Amlodipine was switched to metoprolol Marcelline Deist was added . He is doing better until he smoked a cigarette and became very sob  Says he almost went blind with a medicine which his PCP asked him to stop- He does not know what the pill was and what he was taking it .    06/01/21  HIV diagnosed  early 8s., says it was a routine blood test, - was in care at [facility] for many years and then went to see Dr.Fitzgerald in 2014-2019, then [facility]  Nadir Cd4 DK- on  08/14/1991 was 440 VL  OI -None  HAARt history First regimen  Azt based Atripla Now on Triumeq 100% adherent to HAART-   Acquired thru sex with men Not sexually active in 20 years   Genotype unknown ? Past Medical History:  Diagnosis Date   Anxiety    Chronic nausea    COPD (chronic obstructive pulmonary disease) (HCC)    Degenerative disk disease    HIV (human immunodeficiency virus infection) (HCC)    HIV (human immunodeficiency virus infection) (HCC)    Insomnia    Muscle spasm    Rotator cuff tear     Past Surgical History:  Procedure Laterality Date   dental procedure N/A    LEFT HEART CATH AND CORONARY ANGIOGRAPHY N/A 09/16/2021   Procedure: LEFT HEART CATH AND CORONARY ANGIOGRAPHY;  Surgeon: Iran Ouch, MD;  Location: [facility] INVASIVE CV LAB;  Service: Cardiovascular;  Laterality: N/A;    Social History   Socioeconomic History   Marital status: Single    Spouse name: Not on file   Number of children: Not on file   Years of education: Not on file   Highest education level: Not on file  Occupational History   Not on file  Tobacco Use   Smoking status: Former    Current packs/day: 1.00    Average packs/day: 1 pack/day for 39.0 years (39.0 ttl pk-yrs)    Types: Cigarettes   Smokeless tobacco: Never   Tobacco comments:    Quit smoking 1 month ago by his report.  Vaping Use   Vaping status: Former   Devices:  tried when they first came out for about 2 weeks  Substance and Sexual Activity   Alcohol use: Not Currently   Drug use: No   Sexual activity: Not on file  Other Topics Concern   Not on file  Social History Narrative   Not on file   Social Determinants of Health   Financial Resource Strain: Not on file  Food Insecurity: No Food Insecurity (12/02/2022)   Hunger Vital Sign    Worried About Running Out of Food in the Last Year: Never true    Ran Out of Food in the Last Year: Never true  Transportation Needs: No Transportation Needs (12/02/2022)   PRAPARE - Administrator, Civil Service (Medical): No    Lack of Transportation (Non-Medical): No  Physical Activity: Not on file  Stress: Not on file  Social Connections:  Not on file  Intimate Partner Violence: Not At Risk (12/02/2022)   Humiliation, Afraid, Rape, and Kick questionnaire    Fear of Current or Ex-Partner: No    Emotionally Abused: No    Physically Abused: No    Sexually Abused: No    Family History  Problem Relation Age of Onset   Alzheimer's disease Mother    Breast cancer Mother    Heart disease Father   Mother breast cancer Allergies  Allergen Reactions   Dolutegravir-Lamivudine Other (See Comments)    Severe Flu like symptoms  Sweating   Shortness of breath  Body aches-- Doubt this is Dovato allergy as he is tolerating truimeq which is dovato without abacavir  Other Reaction(s): Other (See Comments)  Severe Flu like symptoms Sweating  Shortness of breath Body aches-- Doubt this is Dovato allergy as he is tolerating truimeq which is dovato + abacavir   Entresto [Sacubitril-Valsartan] Photosensitivity    Dizziness   Metoprolol Succinate [Metoprolol] Photosensitivity    Patients reported change in vision. D/c by pt pcp   ? Current Outpatient Medications  Medication Sig Dispense Refill   albuterol (VENTOLIN HFA) 108 (90 Base) MCG/ACT inhaler Inhale 2 puffs into the lungs every 6 (six) hours as needed for wheezing or shortness of breath. 6.7 g 0   amLODipine (NORVASC) 5 MG tablet Take 5 mg by mouth daily.     aspirin EC 81 MG EC tablet Take 1 tablet (81 mg total) by mouth daily. Swallow whole. 30 tablet 2   baclofen (LIORESAL) 10 MG tablet TAKE 1 TABLET BY MOUTH EVERY DAY 30 tablet 11   BREZTRI AEROSPHERE 160-9-4.8 MCG/ACT AERO Inhale 2 puffs into the lungs 2 (two) times daily.     budesonide-formoterol (SYMBICORT) 80-4.5 MCG/ACT inhaler Inhale 2 puffs into the lungs 2 (two) times daily as needed.     cabotegravir & rilpivirine ER (CABENUVA) 600 & 900 MG/3ML injection Inject 1 kit into the muscle every 2 (two) months. 6 mL 5   cabotegravir & rilpivirine ER (CABENUVA) 600 & 900 MG/3ML injection Inject 1 kit into the muscle every  30 (thirty) days. 6 mL 1   cromolyn (OPTICROM) 4 % ophthalmic solution      dicyclomine (BENTYL) 20 MG tablet Take 20 mg by mouth in the morning and at bedtime.     fluorometholone (FML) 0.1 % ophthalmic suspension SMARTSIG:In Eye(s)     Ipratropium-Albuterol (COMBIVENT) 20-100 MCG/ACT AERS respimat Inhale into the lungs as directed.     ketoconazole (NIZORAL) 2 % cream Apply 1 application topically daily as needed for irritation.     lubiprostone (AMITIZA) 24 MCG capsule  Take 24 mcg by mouth 2 (two) times daily.     montelukast (SINGULAIR) 10 MG tablet Take 10 mg by mouth daily.     morphine (MS CONTIN) 30 MG 12 hr tablet Take 1 tablet (30 mg total) by mouth 2 (two) times daily. 60 tablet 0   mupirocin ointment (BACTROBAN) 2 % SMARTSIG:1 Sparingly Topical Twice Daily PRN     spironolactone (ALDACTONE) 25 MG tablet Take 25 mg by mouth 2 (two) times daily.     triamcinolone cream (KENALOG) 0.1 % Apply 1 Application topically 3 (three) times daily.     VITAMIN D PO Take 1,000 Units by mouth daily.     No current facility-administered medications for this visit.    REVIEW OF SYSTEMS:  Const: negative fever, negative chills, has gained 30 pounds ENT: negative coryza, negative sore throat Resp: has copd , doing better- off oxygen Cards: negative for chest pain, palpitations, lower extremity edema GU: negative for frequency, dysuria and hematuria Skin: thought he had a spider bite to rt buttock Heme: negative for easy bruising and gum/nose bleeding MS: back pain better Neurolo:negative for headaches, dizziness, vertigo, memory problems  Psych: PTSD  Objective:  VITALS: BP (!) 157/74   Pulse 97   Temp (!) 97.4 F (36.3 C) (Temporal)   Ht 5\' 10"  (1.778 m)   Wt 158 lb (71.7 kg)   BMI 22.67 kg/m    PHYSICAL EXAM:  General: Alert, cooperative, no distress, appears stated age.  Head: Normocephalic, without obvious abnormality, atraumatic. Eyes: Conjunctivae clear, anicteric sclerae.  Pupils are equal Full set of dentures Neck: Supple, symmetrical, no adenopathy, thyroid: non tender no carotid bruit and no JVD. Lungs bilateral air entry  heart: Regular rate and rhythm, no murmur, rub or gallop. Abdomen: Did not examine Extremities: Extremities normal, atraumatic, no cyanosis. No edema. No clubbing Skin:   Lymph: Cervical, supraclavicular normal. Neurologic: Grossly non-focal IMAGING RESULTS: Health maintenance Vaccination  Vaccine Date last given comment  Influenza    Hepatitis B Has antibodies   Hepatitis A    Prevnar-PCV-13 ?   Pneumovac-PPSV-23 2020   TdaP 2021   HPV    Shingrix ( zoster vaccine)    Prevnar 20 -March 2024 ______________________  Labs Lab Result  Date comment  HIV VL <20 07/05/23   CD4 367 07/05/23   Genotype     [id number] NR    HIV antibody     RPR NR 03/01/23   Quantiferon Gold Negative    Hep C ab reactive    Hepatitis B-ab,ag,c Sab > 12.7    Hepatitis A-IgM, IgG /T Total-pos    Lipid TC 138, LDL 90, TGL 61, HDL 36    GC/CHL          HB,PLT,Cr, LFT 13.7, Plt 205      Preventive  Procedure Result  Date comment  colonoscopy  2018        Dental exam     Opthal       Impression/Recommendation ? ?HIV disease- he is well controlled- Vl < 20 and cd4 is 367 ( 30%) on injectable cabenuva Doing well  Ex Smoker-  HTN on amlodipine 5mg  -   HLD- was on rosuvastatin- he stopped because he was losing weight and does not want to take it anymore  Not sexually active in 16 yrs and does not want anal pap or STD screen now.   Treated Hepatits C- HCV RNA not detected  COPD- on inhalers, using   oxygen  this week because of the heat and humidity followed by [facility], but has not seen him in 10 months Says COPD stable   LBA- on MS contin Pain  management by PCP?  Small wound on the left buttock- excoriation No bites or MRSA  Labs in 2 months

## 2023-10-25 ENCOUNTER — Other Ambulatory Visit (HOSPITAL_COMMUNITY): Payer: Self-pay

## 2023-10-29 ENCOUNTER — Other Ambulatory Visit (HOSPITAL_COMMUNITY): Payer: Self-pay

## 2023-10-29 ENCOUNTER — Other Ambulatory Visit: Payer: Self-pay

## 2023-10-29 NOTE — Progress Notes (Signed)
Specialty Pharmacy Refill Coordination Note  Albert Wong is a 67 y.o. male assessed today regarding refills of clinic administered specialty medication(s) Cabotegravir & Rilpivirine   Clinic requested Courier to Provider Office   Delivery date: 11/01/23   Verified address: [facility] [address] [place] [address]   Medication will be filled on 10/31/23.

## 2023-10-31 ENCOUNTER — Other Ambulatory Visit: Payer: Self-pay

## 2023-11-01 ENCOUNTER — Other Ambulatory Visit
Admission: RE | Admit: 2023-11-01 | Discharge: 2023-11-01 | Disposition: A | Discharge status: Home / Self Care | Payer: 59 | Source: Ambulatory Visit | Attending: Infectious Diseases | Admitting: Infectious Diseases

## 2023-11-01 ENCOUNTER — Telehealth: Payer: Self-pay | Admitting: Infectious Diseases

## 2023-11-01 ENCOUNTER — Other Ambulatory Visit: Payer: Self-pay

## 2023-11-01 DIAGNOSIS — B20 Human immunodeficiency virus [HIV] disease: Secondary | ICD-10-CM

## 2023-11-01 DIAGNOSIS — Z79899 Other long term (current) drug therapy: Secondary | ICD-10-CM

## 2023-11-01 DIAGNOSIS — Z113 Encounter for screening for infections with a predominantly sexual mode of transmission: Secondary | ICD-10-CM

## 2023-11-01 LAB — COMPREHENSIVE METABOLIC PANEL
ALT: 8 U/L (ref 0–44)
AST: 20 U/L (ref 15–41)
Albumin: 4.2 g/dL (ref 3.5–5.0)
Alkaline Phosphatase: 39 U/L (ref 38–126)
Anion gap: 7 (ref 5–15)
BUN: 12 mg/dL (ref 8–23)
CO2: 27 mmol/L (ref 22–32)
Calcium: 9 mg/dL (ref 8.9–10.3)
Chloride: 104 mmol/L (ref 98–111)
Creatinine, Ser: 0.94 mg/dL (ref 0.61–1.24)
GFR, Estimated: >60 mL/min (ref >60)
Glucose, Bld: 151 mg/dL — ABNORMAL HIGH (ref 70–99)
Potassium: 4.1 mmol/L (ref 3.5–5.1)
Sodium: 138 mmol/L (ref 135–145)
Total Bilirubin: 0.5 mg/dL (ref <1.2)
Total Protein: 7.3 g/dL (ref 6.5–8.1)

## 2023-11-01 LAB — LIPID PANEL
Cholesterol: 202 mg/dL — ABNORMAL HIGH (ref 0–200)
HDL: 54 mg/dL (ref >40)
LDL Cholesterol: 129 mg/dL — ABNORMAL HIGH (ref 0–99)
Total CHOL/HDL Ratio: 3.7 {ratio}
Triglycerides: 93 mg/dL (ref <150)
VLDL: 19 mg/dL (ref 0–40)

## 2023-11-01 LAB — CBC WITH DIFFERENTIAL/PLATELET
Abs Immature Granulocytes: 0.02 10*3/uL (ref 0.00–0.07)
Basophils Absolute: 0 10*3/uL (ref 0.0–0.1)
Basophils Relative: 1 %
Eosinophils Absolute: 0.3 10*3/uL (ref 0.0–0.5)
Eosinophils Relative: 4 %
HCT: 46.1 % (ref 39.0–52.0)
Hemoglobin: 15.2 g/dL (ref 13.0–17.0)
Immature Granulocytes: 0 %
Lymphocytes Relative: 23 %
Lymphs Abs: 1.7 10*3/uL (ref 0.7–4.0)
MCH: 30.5 pg (ref 26.0–34.0)
MCHC: 33 g/dL (ref 30.0–36.0)
MCV: 92.4 fL (ref 80.0–100.0)
Monocytes Absolute: 0.7 10*3/uL (ref 0.1–1.0)
Monocytes Relative: 9 %
Neutro Abs: 4.8 10*3/uL (ref 1.7–7.7)
Neutrophils Relative %: 63 %
Platelets: 293 10*3/uL (ref 150–400)
RBC: 4.99 MIL/uL (ref 4.22–5.81)
RDW: 11.9 % (ref 11.5–15.5)
WBC: 7.5 10*3/uL (ref 4.0–10.5)
nRBC: 0 % (ref 0.0–0.2)

## 2023-11-01 NOTE — Telephone Encounter (Signed)
LABS ORDERED.

## 2023-11-01 NOTE — Telephone Encounter (Signed)
Patient has appointment on the 14th and needs Labs a week prior. He is going on tomorrow 11-02-2023 and wants to make sure his LAB order is in . Thanks

## 2023-11-02 LAB — T-HELPER CELLS CD4/CD8 %
% CD 4 Pos. Lymph.: 29.2 % — ABNORMAL LOW (ref 30.8–58.5)
Absolute CD 4 Helper: 496 /uL (ref 359–1519)
Basophils Absolute: 0.1 10*3/uL (ref 0.0–0.2)
Basos: 1 %
CD3+CD4+ Cells/CD3+CD8+ Cells Bld: 0.6 — ABNORMAL LOW (ref 0.92–3.72)
CD3+CD8+ Cells # Bld: 828 /uL (ref 109–897)
CD3+CD8+ Cells NFr Bld: 48.7 % — ABNORMAL HIGH (ref 12.0–35.5)
EOS (ABSOLUTE): 0.3 10*3/uL (ref 0.0–0.4)
Eos: 4 %
Hematocrit: 46 % (ref 37.5–51.0)
Hemoglobin: 15.6 g/dL (ref 13.0–17.7)
Immature Grans (Abs): 0 10*3/uL (ref 0.0–0.1)
Immature Granulocytes: 1 %
Lymphocytes Absolute: 1.7 10*3/uL (ref 0.7–3.1)
Lymphs: 23 %
MCH: 30.8 pg (ref 26.6–33.0)
MCHC: 33.9 g/dL (ref 31.5–35.7)
MCV: 91 fL (ref 79–97)
Monocytes Absolute: 0.7 10*3/uL (ref 0.1–0.9)
Monocytes: 9 %
Neutrophils Absolute: 4.9 10*3/uL (ref 1.4–7.0)
Neutrophils: 62 %
Platelets: 317 10*3/uL (ref 150–450)
RBC: 5.07 x10E6/uL (ref 4.14–5.80)
RDW: 11.6 % (ref 11.6–15.4)
WBC: 7.7 10*3/uL (ref 3.4–10.8)

## 2023-11-02 LAB — RPR: RPR Ser Ql: NONREACTIVE

## 2023-11-02 LAB — HIV-1 RNA QUANT-NO REFLEX-BLD
HIV 1 RNA Quant: <20 {copies}/mL
LOG10 HIV-1 RNA: UNDETERMINED {Log}

## 2023-11-05 ENCOUNTER — Other Ambulatory Visit (HOSPITAL_COMMUNITY): Payer: Self-pay

## 2023-11-08 ENCOUNTER — Ambulatory Visit: Payer: 59 | Attending: Infectious Diseases

## 2023-11-08 ENCOUNTER — Ambulatory Visit: Payer: 59

## 2023-11-08 DIAGNOSIS — B20 Human immunodeficiency virus [HIV] disease: Secondary | ICD-10-CM | POA: Diagnosis not present

## 2023-11-08 MED ORDER — CABOTEGRAVIR & RILPIVIRINE ER 600 & 900 MG/3ML IM SUER
1.0000 | Freq: Once | INTRAMUSCULAR | Status: AC
Start: 2023-11-08 — End: 2023-11-08
  Administered 2023-11-08: 1 via INTRAMUSCULAR

## 2023-11-08 NOTE — Progress Notes (Signed)
Patient in office for Cabenuva injection. Patient tolerated well. Ragan Reale T Pricilla Loveless

## 2023-12-04 ENCOUNTER — Ambulatory Visit: Payer: 59 | Admitting: Infectious Diseases

## 2023-12-06 NOTE — Progress Notes (Signed)
Pt here for cabenuva injection for HIV

## 2023-12-12 ENCOUNTER — Telehealth: Payer: Self-pay

## 2023-12-12 NOTE — Telephone Encounter (Signed)
I see he has a history of back pain. Certainly, receiving these injections could cause discomfort/soreness around the lower back near the injection site, but I would not not attribute upper back pain to Albert Wong. I don't see any mention of pain from the injections in previous notes. Will let Dr. Rivka Safer weigh in as well.

## 2023-12-12 NOTE — Telephone Encounter (Signed)
Patient called wanting to ask about side effects of cabenuva. Patient has been having upper right back pain around kidney area. Patient denied having any urinary pain. Advised I would send a message back to provider and pharmacists.    Albert Wong, CMA

## 2023-12-13 NOTE — Telephone Encounter (Signed)
Patient aware.

## 2023-12-21 ENCOUNTER — Other Ambulatory Visit (HOSPITAL_COMMUNITY): Payer: Self-pay

## 2023-12-24 ENCOUNTER — Other Ambulatory Visit (HOSPITAL_COMMUNITY): Payer: Self-pay

## 2023-12-24 ENCOUNTER — Other Ambulatory Visit: Payer: Self-pay

## 2023-12-24 NOTE — Progress Notes (Signed)
Specialty Pharmacy Refill Coordination Note  Albert Wong is a 67 y.o. male assessed today regarding refills of clinic administered specialty medication(s) Cabotegravir & Rilpivirine [facility])   Clinic requested Courier to Provider Office   Delivery date: 12/27/23   Verified address: [address] [place] [address]   Medication will be filled on 12/25/23.

## 2023-12-25 ENCOUNTER — Other Ambulatory Visit: Payer: Self-pay

## 2024-01-10 ENCOUNTER — Ambulatory Visit: Payer: 59 | Attending: Infectious Diseases | Admitting: Infectious Diseases

## 2024-01-10 ENCOUNTER — Encounter: Payer: Self-pay | Admitting: Infectious Diseases

## 2024-01-10 VITALS — BP 116/72 | HR 83 | Temp 98.4°F | Ht 70.0 in | Wt 179.0 lb

## 2024-01-10 DIAGNOSIS — K5900 Constipation, unspecified: Secondary | ICD-10-CM | POA: Diagnosis not present

## 2024-01-10 DIAGNOSIS — J449 Chronic obstructive pulmonary disease, unspecified: Secondary | ICD-10-CM | POA: Insufficient documentation

## 2024-01-10 DIAGNOSIS — B20 Human immunodeficiency virus [HIV] disease: Secondary | ICD-10-CM | POA: Diagnosis not present

## 2024-01-10 DIAGNOSIS — Z8619 Personal history of other infectious and parasitic diseases: Secondary | ICD-10-CM | POA: Insufficient documentation

## 2024-01-10 DIAGNOSIS — E785 Hyperlipidemia, unspecified: Secondary | ICD-10-CM | POA: Diagnosis not present

## 2024-01-10 DIAGNOSIS — M549 Dorsalgia, unspecified: Secondary | ICD-10-CM | POA: Diagnosis not present

## 2024-01-10 DIAGNOSIS — Z79624 Long term (current) use of inhibitors of nucleotide synthesis: Secondary | ICD-10-CM | POA: Insufficient documentation

## 2024-01-10 DIAGNOSIS — Z87891 Personal history of nicotine dependence: Secondary | ICD-10-CM | POA: Insufficient documentation

## 2024-01-10 DIAGNOSIS — I10 Essential (primary) hypertension: Secondary | ICD-10-CM | POA: Insufficient documentation

## 2024-01-10 DIAGNOSIS — Z79899 Other long term (current) drug therapy: Secondary | ICD-10-CM | POA: Insufficient documentation

## 2024-01-10 MED ORDER — CABOTEGRAVIR & RILPIVIRINE ER 600 & 900 MG/3ML IM SUER
1.0000 | Freq: Once | INTRAMUSCULAR | Status: AC
Start: 2024-01-10 — End: 2024-01-10
  Administered 2024-01-10: 1 via INTRAMUSCULAR

## 2024-01-10 NOTE — Progress Notes (Signed)
NAME: Albert Wong  DOB: 01-31-1956  MRN: [id number]  Date/Time: 01/10/2024 12:05 PM   Subjective:  Albert Wong is a 68 y.o.male  with a history of HIV disease diagnosed in early 80s , Treated hepatitis C , COPD, , on MS contin for back pain HTN, hyperlipidemia Pt is here for follow up for HIV. For cabenuva- doing well-   He took his first dose of cabenuva IM on 4/18- did well- vl done on 07/05/23  is < 20 Doing well Some abdominal bloating feeling No pain, no diarrhea Has constipation  Patient has not been sexually active in 16 years.   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 2.  Moderately reduced LV systolic function with wall motion abnormality suggestive of stress-induced cardiomyopathy.  Mildly elevated left ventricular end-diastolic pressure. Medical management for non obstructive CAD was recommended. EF was 40-45% Amlodipine was switched to metoprolol Marcelline Deist was added . He is doing better until he smoked a cigarette and became very sob  Says he almost went blind with a medicine which his PCP asked him to stop- He does not know what the pill was and what he was taking it .    06/01/21  HIV diagnosed  early 69s., says it was a routine blood test, - was in care at [facility] for many years and then went to see Dr.Fitzgerald in 2014-2019, then [facility]  Nadir Cd4 DK- on  08/14/1991 was 440 VL  OI -None  HAARt history First regimen  Azt based Atripla  Triumeq Cabenuva 100% adherent to HAART-   Acquired thru sex with men Not sexually active in 20 years  Genotype unknown ? Past Medical History:  Diagnosis Date   Anxiety    Chronic  nausea    COPD (chronic obstructive pulmonary disease) (HCC)    Degenerative disk disease    HIV (human immunodeficiency virus infection) (HCC)    HIV (human immunodeficiency virus infection) (HCC)    Insomnia    Muscle spasm    Rotator cuff tear     Past Surgical History:  Procedure Laterality Date   dental procedure N/A    LEFT HEART CATH AND CORONARY ANGIOGRAPHY N/A 09/16/2021   Procedure: LEFT HEART CATH AND CORONARY ANGIOGRAPHY;  Surgeon: Iran Ouch, MD;  Location: [facility] INVASIVE CV LAB;  Service: Cardiovascular;  Laterality: N/A;    Social History   Socioeconomic History   Marital status: Single    Spouse name: Not on file   Number of children: Not on file   Years of education: Not on file   Highest education level: Not on file  Occupational History   Not on file  Tobacco Use   Smoking status: Former    Current packs/day: 1.00    Average packs/day: 1 pack/day for 39.0 years (39.0 ttl pk-yrs)    Types: Cigarettes   Smokeless tobacco: Never   Tobacco comments:    Quit smoking 1 month ago by his report.  Vaping Use   Vaping status: Former   Devices: tried when they first came out for about 2 weeks  Substance and Sexual Activity   Alcohol use: Not  Currently   Drug use: No   Sexual activity: Not on file  Other Topics Concern   Not on file  Social History Narrative   Not on file   Social Drivers of Health   Financial Resource Strain: Not on file  Food Insecurity: No Food Insecurity (12/02/2022)   Hunger Vital Sign    Worried About Running Out of Food in the Last Year: Never true    Ran Out of Food in the Last Year: Never true  Transportation Needs: No Transportation Needs (12/02/2022)   PRAPARE - Administrator, Civil Service (Medical): No    Lack of Transportation (Non-Medical): No  Physical Activity: Not on file  Stress: Not on file  Social Connections: Not on file  Intimate Partner Violence: Not At Risk (12/02/2022)   Humiliation, Afraid,  Rape, and Kick questionnaire    Fear of Current or Ex-Partner: No    Emotionally Abused: No    Physically Abused: No    Sexually Abused: No    Family History  Problem Relation Age of Onset   Alzheimer's disease Mother    Breast cancer Mother    Heart disease Father   Mother breast cancer Allergies  Allergen Reactions   Dolutegravir-Lamivudine Other (See Comments)    Severe Flu like symptoms  Sweating   Shortness of breath  Body aches-- Doubt this is Dovato allergy as he is tolerating truimeq which is dovato without abacavir  Other Reaction(s): Other (See Comments)  Severe Flu like symptoms Sweating  Shortness of breath Body aches-- Doubt this is Dovato allergy as he is tolerating truimeq which is dovato + abacavir   Entresto [Sacubitril-Valsartan] Photosensitivity    Dizziness   Metoprolol Succinate [Metoprolol] Photosensitivity    Patients reported change in vision. D/c by pt pcp   ? Current Outpatient Medications  Medication Sig Dispense Refill   albuterol (VENTOLIN HFA) 108 (90 Base) MCG/ACT inhaler Inhale 2 puffs into the lungs every 6 (six) hours as needed for wheezing or shortness of breath. 6.7 g 0   amLODipine (NORVASC) 5 MG tablet Take 5 mg by mouth daily.     aspirin EC 81 MG EC tablet Take 1 tablet (81 mg total) by mouth daily. Swallow whole. 30 tablet 2   baclofen (LIORESAL) 10 MG tablet TAKE 1 TABLET BY MOUTH EVERY DAY 30 tablet 11   BREZTRI AEROSPHERE 160-9-4.8 MCG/ACT AERO Inhale 2 puffs into the lungs 2 (two) times daily.     budesonide-formoterol (SYMBICORT) 80-4.5 MCG/ACT inhaler Inhale 2 puffs into the lungs 2 (two) times daily as needed.     cabotegravir & rilpivirine ER (CABENUVA) 600 & 900 MG/3ML injection Inject 1 kit into the muscle every 2 (two) months. 6 mL 5   cabotegravir & rilpivirine ER (CABENUVA) 600 & 900 MG/3ML injection Inject 1 kit into the muscle every 30 (thirty) days. 6 mL 1   cromolyn (OPTICROM) 4 % ophthalmic solution      dicyclomine  (BENTYL) 20 MG tablet Take 20 mg by mouth in the morning and at bedtime.     fluorometholone (FML) 0.1 % ophthalmic suspension SMARTSIG:In Eye(s)     Ipratropium-Albuterol (COMBIVENT) 20-100 MCG/ACT AERS respimat Inhale into the lungs as directed.     ketoconazole (NIZORAL) 2 % cream Apply 1 application topically daily as needed for irritation.     lubiprostone (AMITIZA) 24 MCG capsule Take 24 mcg by mouth 2 (two) times daily.     montelukast (SINGULAIR) 10 MG tablet Take 10  mg by mouth daily.     morphine (MS CONTIN) 30 MG 12 hr tablet Take 1 tablet (30 mg total) by mouth 2 (two) times daily. 60 tablet 0   mupirocin ointment (BACTROBAN) 2 % SMARTSIG:1 Sparingly Topical Twice Daily PRN     spironolactone (ALDACTONE) 25 MG tablet Take 25 mg by mouth 2 (two) times daily.     triamcinolone cream (KENALOG) 0.1 % Apply 1 Application topically 3 (three) times daily.     VITAMIN D PO Take 1,000 Units by mouth daily.     No current facility-administered medications for this visit.    REVIEW OF SYSTEMS:  Const: negative fever, negative chills, has gained 30 pounds ENT: negative coryza, negative sore throat Resp: has copd , doing better- off oxygen Cards: negative for chest pain, palpitations, lower extremity edema GU: negative for frequency, dysuria and hematuria Skin: thought he had a spider bite to rt buttock Heme: negative for easy bruising and gum/nose bleeding MS: back pain better Neurolo:negative for headaches, dizziness, vertigo, memory problems  Psych: PTSD  Objective:  VITALS: BP 116/72   Pulse 83   Temp 98.4 F (36.9 C) (Temporal)   Ht 5\' 10"  (1.778 m)   Wt 179 lb (81.2 kg)   BMI 25.68 kg/m    PHYSICAL EXAM:  General: Alert, cooperative, no distress, appears stated age.  Head: Normocephalic, without obvious abnormality, atraumatic. Eyes: Conjunctivae clear, anicteric sclerae. Pupils are equal Full set of dentures Neck: Supple, symmetrical, no adenopathy, thyroid: non  tender no carotid bruit and no JVD. Lungs bilateral air entry  heart: Regular rate and rhythm, no murmur, rub or gallop. Abdomen: Did not examine Extremities: Extremities normal, atraumatic, no cyanosis. No edema. No clubbing Skin:  Lymph: Cervical, supraclavicular normal. Neurologic: Grossly non-focal IMAGING RESULTS: Health maintenance Vaccination  Vaccine Date last given comment  Influenza    Hepatitis B Has antibodies   Hepatitis A    Prevnar-PCV-13 ?   Pneumovac-PPSV-23 2020   TdaP 2021   HPV    Shingrix ( zoster vaccine)    Prevnar 20 -March 2024 ______________________  Labs Lab Result  Date comment  HIV VL <20 07/05/23   CD4 367 07/05/23   Genotype     [id number] NR    HIV antibody     RPR NR 03/01/23   Quantiferon Gold Negative    Hep C ab reactive    Hepatitis B-ab,ag,c Sab > 12.7    Hepatitis A-IgM, IgG /T Total-pos    Lipid TC 138, LDL 90, TGL 61, HDL 36    GC/CHL          HB,PLT,Cr, LFT 13.7, Plt 205      Preventive  Procedure Result  Date comment  colonoscopy  2018        Dental exam     Opthal       Impression/Recommendation ? ?HIV disease- he is well controlled- Vl < 20 and cd4 is 367 ( 30%) on injectable cabenuva Doing well- continu cabenua Will do labs with next visit- Hba1c needed  Ex Smoker-  HTN on amlodipine 5mg  -   HLD- was on rosuvastatin- he stopped because he was losing weight and does not want to take it anymore  Not sexually active in 16 yrs and does not want anal pap or STD screen now.   Treated Hepatits C- HCV RNA not detected  COPD- on inhalers, using   oxygen this week because of the heat and humidity followed by [facility], but has  not seen him in 10 months Says COPD stable   LBA- on MS contin Pain  management by PCP?   Labs in 2 months

## 2024-02-26 ENCOUNTER — Other Ambulatory Visit (HOSPITAL_COMMUNITY): Payer: Self-pay

## 2024-02-26 ENCOUNTER — Other Ambulatory Visit: Payer: Self-pay

## 2024-02-26 NOTE — Progress Notes (Signed)
 Specialty Pharmacy Refill Coordination Note  Albert Wong is a 68 y.o. male assessed today regarding refills of clinic administered specialty medication(s) Cabotegravir & Rilpivirine [facility])   Clinic requested Courier to Provider Office   Delivery date: 03/05/24   Verified address: [address] [place] [address]   Medication will be filled on 03/04/24.

## 2024-03-05 ENCOUNTER — Other Ambulatory Visit
Admission: RE | Admit: 2024-03-05 | Discharge: 2024-03-05 | Disposition: A | Discharge status: Home / Self Care | Source: Ambulatory Visit | Attending: Infectious Diseases | Admitting: Infectious Diseases

## 2024-03-05 DIAGNOSIS — B20 Human immunodeficiency virus [HIV] disease: Secondary | ICD-10-CM | POA: Insufficient documentation

## 2024-03-05 DIAGNOSIS — R7309 Other abnormal glucose: Secondary | ICD-10-CM | POA: Insufficient documentation

## 2024-03-05 LAB — CBC WITH DIFFERENTIAL/PLATELET
Abs Immature Granulocytes: 0.03 10*3/uL (ref 0.00–0.07)
Basophils Absolute: 0.1 10*3/uL (ref 0.0–0.1)
Basophils Relative: 1 %
Eosinophils Absolute: 0.4 10*3/uL (ref 0.0–0.5)
Eosinophils Relative: 6 %
HCT: 46.3 % (ref 39.0–52.0)
Hemoglobin: 15.6 g/dL (ref 13.0–17.0)
Immature Granulocytes: 0 %
Lymphocytes Relative: 24 %
Lymphs Abs: 1.8 10*3/uL (ref 0.7–4.0)
MCH: 30.6 pg (ref 26.0–34.0)
MCHC: 33.7 g/dL (ref 30.0–36.0)
MCV: 90.8 fL (ref 80.0–100.0)
Monocytes Absolute: 0.6 10*3/uL (ref 0.1–1.0)
Monocytes Relative: 8 %
Neutro Abs: 4.5 10*3/uL (ref 1.7–7.7)
Neutrophils Relative %: 61 %
Platelets: 293 10*3/uL (ref 150–400)
RBC: 5.1 MIL/uL (ref 4.22–5.81)
RDW: 11.8 % (ref 11.5–15.5)
WBC: 7.4 10*3/uL (ref 4.0–10.5)
nRBC: 0 % (ref 0.0–0.2)

## 2024-03-05 LAB — COMPREHENSIVE METABOLIC PANEL
ALT: 11 U/L (ref 0–44)
AST: 22 U/L (ref 15–41)
Albumin: 4.4 g/dL (ref 3.5–5.0)
Alkaline Phosphatase: 45 U/L (ref 38–126)
Anion gap: 11 (ref 5–15)
BUN: 10 mg/dL (ref 8–23)
CO2: 26 mmol/L (ref 22–32)
Calcium: 9.3 mg/dL (ref 8.9–10.3)
Chloride: 102 mmol/L (ref 98–111)
Creatinine, Ser: 0.83 mg/dL (ref 0.61–1.24)
GFR, Estimated: >60 mL/min (ref >60)
Glucose, Bld: 142 mg/dL — ABNORMAL HIGH (ref 70–99)
Potassium: 3.9 mmol/L (ref 3.5–5.1)
Sodium: 139 mmol/L (ref 135–145)
Total Bilirubin: 0.9 mg/dL (ref 0.0–1.2)
Total Protein: 7.7 g/dL (ref 6.5–8.1)

## 2024-03-05 LAB — HEMOGLOBIN A1C
Hgb A1c MFr Bld: 5.2 % (ref 4.8–5.6)
Mean Plasma Glucose: 102.54 mg/dL

## 2024-03-05 NOTE — Addendum Note (Signed)
 Addended by: Lonell Face C on: 03/05/2024 10:25 AM   Modules accepted: Orders

## 2024-03-06 LAB — RPR: RPR Ser Ql: NONREACTIVE

## 2024-03-06 LAB — HIV-1 RNA QUANT-NO REFLEX-BLD
HIV 1 RNA Quant: <20 {copies}/mL
LOG10 HIV-1 RNA: UNDETERMINED {Log_copies}/mL

## 2024-03-11 ENCOUNTER — Encounter: Payer: Self-pay | Admitting: Infectious Diseases

## 2024-03-11 ENCOUNTER — Ambulatory Visit: Payer: 59 | Attending: Infectious Diseases | Admitting: Infectious Diseases

## 2024-03-11 VITALS — BP 131/76 | HR 71 | Temp 98.1°F | Ht 70.0 in | Wt 178.0 lb

## 2024-03-11 DIAGNOSIS — B20 Human immunodeficiency virus [HIV] disease: Secondary | ICD-10-CM | POA: Diagnosis not present

## 2024-03-11 DIAGNOSIS — Z21 Asymptomatic human immunodeficiency virus [HIV] infection status: Secondary | ICD-10-CM | POA: Diagnosis present

## 2024-03-11 DIAGNOSIS — J449 Chronic obstructive pulmonary disease, unspecified: Secondary | ICD-10-CM | POA: Diagnosis not present

## 2024-03-11 DIAGNOSIS — Z87891 Personal history of nicotine dependence: Secondary | ICD-10-CM | POA: Insufficient documentation

## 2024-03-11 DIAGNOSIS — Z79899 Other long term (current) drug therapy: Secondary | ICD-10-CM | POA: Diagnosis not present

## 2024-03-11 DIAGNOSIS — I10 Essential (primary) hypertension: Secondary | ICD-10-CM | POA: Insufficient documentation

## 2024-03-11 MED ORDER — CABOTEGRAVIR & RILPIVIRINE ER 600 & 900 MG/3ML IM SUER
1.0000 | Freq: Once | INTRAMUSCULAR | Status: AC
Start: 2024-03-11 — End: 2024-03-11
  Administered 2024-03-11: 1 via INTRAMUSCULAR

## 2024-03-11 NOTE — Progress Notes (Signed)
 NAME: Albert Wong  DOB: 05/05/1956  MRN: [id number]  Date/Time: 03/11/2024 12:07 PM   Subjective:  Albert Wong is a 68 y.o.male  with a history of HIV disease diagnosed in early 80s , Treated hepatitis C , COPD, , on MS contin for back pain HTN, hyperlipidemia Pt is here for follow up for HIV. For cabenuva- doing well-   He took his first dose of cabenuva IM on 4/18-/24 did well- vl done on 07/05/23  is < 20 Doing well  Patient has not been sexually active in 16 years.    06/01/21  HIV diagnosed  early 4s., says it was a routine blood test, - was in care at [facility] for many years and then went to see Dr.Fitzgerald in 2014-2019, then [facility]  Nadir Cd4 DK- on  08/14/1991 was 440 VL  OI -None  HAARt history First regimen  Azt based Atripla  Triumeq Cabenuva 100% adherent to HAART-   Acquired thru sex with men Not sexually active in 20 years  Genotype unknown ? Past Medical History:  Diagnosis Date   Anxiety    Chronic nausea    COPD (chronic obstructive pulmonary disease) (HCC)    Degenerative disk disease    HIV (human immunodeficiency virus infection) (HCC)    HIV (human immunodeficiency virus infection) (HCC)    Insomnia    Muscle spasm    Rotator cuff tear     Past Surgical History:  Procedure Laterality Date   dental procedure N/A    LEFT HEART CATH AND CORONARY ANGIOGRAPHY N/A 09/16/2021   Procedure: LEFT HEART CATH AND CORONARY ANGIOGRAPHY;  Surgeon: Iran Ouch, MD;  Location: [facility] INVASIVE CV LAB;  Service: Cardiovascular;  Laterality: N/A;    Social History   Socioeconomic History   Marital status: Single    Spouse name: Not on file   Number of children: Not on file   Years of education: Not on file   Highest education level: Not on file  Occupational History   Not on file  Tobacco Use   Smoking status: Former    Current packs/day: 1.00    Average packs/day: 1 pack/day for 39.0 years (39.0 ttl pk-yrs)    Types: Cigarettes   Smokeless tobacco: Never    Tobacco comments:    Quit smoking 1 month ago by his report.  Vaping Use   Vaping status: Former   Devices: tried when they first came out for about 2 weeks  Substance and Sexual Activity   Alcohol use: Not Currently   Drug use: No   Sexual activity: Not on file  Other Topics Concern   Not on file  Social History Narrative   Not on file   Social Drivers of Health   Financial Resource Strain: Not on file  Food Insecurity: No Food Insecurity (12/02/2022)   Hunger Vital Sign    Worried About Running Out of Food in the Last Year: Never true    Ran Out of Food in the Last Year: Never true  Transportation Needs: No Transportation Needs (12/02/2022)   PRAPARE - Administrator, Civil Service (Medical): No    Lack of Transportation (Non-Medical): No  Physical Activity: Not on file  Stress: Not on file  Social Connections: Not on file  Intimate Partner Violence: Not At Risk (12/02/2022)   Humiliation, Afraid, Rape, and Kick questionnaire    Fear of Current or Ex-Partner: No    Emotionally Abused: No    Physically Abused:  No    Sexually Abused: No    Family History  Problem Relation Age of Onset   Alzheimer's disease Mother    Breast cancer Mother    Heart disease Father   Mother breast cancer Allergies  Allergen Reactions   Dolutegravir-Lamivudine Other (See Comments)    Severe Flu like symptoms  Sweating   Shortness of breath  Body aches-- Doubt this is Dovato allergy as he is tolerating truimeq which is dovato without abacavir  Other Reaction(s): Other (See Comments)  Severe Flu like symptoms Sweating  Shortness of breath Body aches-- Doubt this is Dovato allergy as he is tolerating truimeq which is dovato + abacavir   Entresto [Sacubitril-Valsartan] Photosensitivity    Dizziness   Metoprolol Succinate [Metoprolol] Photosensitivity    Patients reported change in vision. D/c by pt pcp   ? Current Outpatient Medications  Medication Sig Dispense Refill    albuterol (VENTOLIN HFA) 108 (90 Base) MCG/ACT inhaler Inhale 2 puffs into the lungs every 6 (six) hours as needed for wheezing or shortness of breath. 6.7 g 0   amLODipine (NORVASC) 5 MG tablet Take 5 mg by mouth daily.     aspirin EC 81 MG EC tablet Take 1 tablet (81 mg total) by mouth daily. Swallow whole. 30 tablet 2   baclofen (LIORESAL) 10 MG tablet TAKE 1 TABLET BY MOUTH EVERY DAY 30 tablet 11   BREZTRI AEROSPHERE 160-9-4.8 MCG/ACT AERO Inhale 2 puffs into the lungs 2 (two) times daily.     budesonide-formoterol (SYMBICORT) 80-4.5 MCG/ACT inhaler Inhale 2 puffs into the lungs 2 (two) times daily as needed.     cabotegravir & rilpivirine ER (CABENUVA) 600 & 900 MG/3ML injection Inject 1 kit into the muscle every 2 (two) months. 6 mL 5   cabotegravir & rilpivirine ER (CABENUVA) 600 & 900 MG/3ML injection Inject 1 kit into the muscle every 30 (thirty) days. 6 mL 1   cromolyn (OPTICROM) 4 % ophthalmic solution      dicyclomine (BENTYL) 20 MG tablet Take 20 mg by mouth in the morning and at bedtime.     fluorometholone (FML) 0.1 % ophthalmic suspension SMARTSIG:In Eye(s)     Ipratropium-Albuterol (COMBIVENT) 20-100 MCG/ACT AERS respimat Inhale into the lungs as directed.     ketoconazole (NIZORAL) 2 % cream Apply 1 application topically daily as needed for irritation.     lubiprostone (AMITIZA) 24 MCG capsule Take 24 mcg by mouth 2 (two) times daily.     montelukast (SINGULAIR) 10 MG tablet Take 10 mg by mouth daily.     morphine (MS CONTIN) 30 MG 12 hr tablet Take 1 tablet (30 mg total) by mouth 2 (two) times daily. 60 tablet 0   mupirocin ointment (BACTROBAN) 2 % SMARTSIG:1 Sparingly Topical Twice Daily PRN     spironolactone (ALDACTONE) 25 MG tablet Take 25 mg by mouth 2 (two) times daily.     triamcinolone cream (KENALOG) 0.1 % Apply 1 Application topically 3 (three) times daily.     VITAMIN D PO Take 1,000 Units by mouth daily.     No current facility-administered medications for this  visit.    REVIEW OF SYSTEMS:  Const: negative fever, negative chills, has gained 30 pounds ENT: negative coryza, negative sore throat Resp: has copd , doing better- off oxygen Cards: negative for chest pain, palpitations, lower extremity edema GU: negative for frequency, dysuria and hematuria Skin: thought he had a spider bite to rt buttock Heme: negative for easy bruising and gum/nose  bleeding MS: back pain better Neurolo:negative for headaches, dizziness, vertigo, memory problems  Psych: PTSD  Objective:  VITALS: BP 131/76   Pulse 71   Temp 98.1 F (36.7 C) (Temporal)   Ht 5\' 10"  (1.778 m)   Wt 178 lb (80.7 kg)   BMI 25.54 kg/m    PHYSICAL EXAM:  General: Alert, cooperative, no distress, appears stated age.  Head: Normocephalic, without obvious abnormality, atraumatic. Eyes: Conjunctivae clear, anicteric sclerae. Pupils are equal Full set of dentures Neck: Supple, symmetrical, no adenopathy, thyroid: non tender no carotid bruit and no JVD. Lungs bilateral air entry  heart: Regular rate and rhythm, no murmur, rub or gallop. Abdomen: soft Extremities: Extremities normal, atraumatic, no cyanosis. No edema. No clubbing Skin:  Lymph: Cervical, supraclavicular normal. Neurologic: Grossly non-focal IMAGING RESULTS: Health maintenance Vaccination  Vaccine Date last given comment  Influenza    Hepatitis B Has antibodies   Hepatitis A    Prevnar-PCV-13 ?   Pneumovac-PPSV-23 2020   TdaP 2021   HPV    Shingrix ( zoster vaccine)    Prevnar 20 -March 2024 ______________________  Labs Lab Result  Date comment  HIV VL <20 07/05/23   CD4 367 07/05/23   Genotype     [id number] NR    HIV antibody     RPR NR 03/01/23   Quantiferon Gold Negative    Hep C ab reactive    Hepatitis B-ab,ag,c Sab > 12.7    Hepatitis A-IgM, IgG /T Total-pos    Lipid TC 138, LDL 90, TGL 61, HDL 36    GC/CHL          HB,PLT,Cr, LFT 13.7, Plt 205      Preventive  Procedure Result  Date  comment  colonoscopy  2018        Dental exam     Opthal       Impression/Recommendation ? ?HIV disease- he is well controlled- Vl < 20 and cd4 is 367 ( 30%) on injectable cabenuva Doing well- continu cabenuva Hba1c is 5,5 But blood glucose fasting is 151  Ex Smoker-  HTN on amlodipine 5mg  -   HLD- was on rosuvastatin- he stopped because he was losing weight and does not want to take it anymore  Not sexually active in 16 yrs and does not want anal pap or STD screen now.   Treated Hepatits C- HCV RNA not detected  COPD- on inhalers, using   oxygen this week because of the heat and humidity followed by Dr.Aleskerov,  Says COPD stable   LBA- on MS contin Pain  management by PCP?   Follow up in 4

## 2024-04-21 ENCOUNTER — Other Ambulatory Visit: Payer: Self-pay

## 2024-04-21 ENCOUNTER — Other Ambulatory Visit: Payer: Self-pay | Admitting: Pharmacist

## 2024-04-21 ENCOUNTER — Other Ambulatory Visit (HOSPITAL_COMMUNITY): Payer: Self-pay

## 2024-04-21 DIAGNOSIS — B20 Human immunodeficiency virus [HIV] disease: Secondary | ICD-10-CM

## 2024-04-21 MED ORDER — CABOTEGRAVIR & RILPIVIRINE ER 600 & 900 MG/3ML IM SUER
1.0000 | INTRAMUSCULAR | 5 refills | Status: AC
  Filled 2024-04-21: qty 6, 60d supply, fill #0
  Filled 2024-06-26: qty 6, 60d supply, fill #1
  Filled 2024-08-21: qty 6, 60d supply, fill #2
  Filled 2024-10-22: qty 6, 60d supply, fill #3
  Filled 2024-12-24: qty 6, 60d supply, fill #4

## 2024-04-21 NOTE — Progress Notes (Signed)
 Specialty Pharmacy Refill Coordination Note  Albert Wong is a 68 y.o. male assessed today regarding refills of clinic administered specialty medication(s) Cabotegravir  & Rilpivirine  (CABENUVA )   Clinic requested Courier to Provider Office   Delivery date: 04/29/24   Verified address: [address] [place] [address]   Medication will be filled on 04/28/24.

## 2024-05-01 ENCOUNTER — Other Ambulatory Visit (HOSPITAL_COMMUNITY): Payer: Self-pay

## 2024-05-06 ENCOUNTER — Encounter: Payer: Self-pay | Admitting: Infectious Diseases

## 2024-05-06 ENCOUNTER — Ambulatory Visit: Attending: Infectious Diseases | Admitting: Infectious Diseases

## 2024-05-06 VITALS — BP 152/69 | HR 83 | Temp 98.2°F | Ht 70.0 in | Wt 178.4 lb

## 2024-05-06 DIAGNOSIS — B20 Human immunodeficiency virus [HIV] disease: Secondary | ICD-10-CM | POA: Diagnosis present

## 2024-05-06 DIAGNOSIS — Z87891 Personal history of nicotine dependence: Secondary | ICD-10-CM

## 2024-05-06 DIAGNOSIS — Z7951 Long term (current) use of inhaled steroids: Secondary | ICD-10-CM | POA: Insufficient documentation

## 2024-05-06 DIAGNOSIS — Z79899 Other long term (current) drug therapy: Secondary | ICD-10-CM | POA: Diagnosis not present

## 2024-05-06 DIAGNOSIS — E785 Hyperlipidemia, unspecified: Secondary | ICD-10-CM | POA: Diagnosis not present

## 2024-05-06 DIAGNOSIS — M545 Low back pain, unspecified: Secondary | ICD-10-CM | POA: Insufficient documentation

## 2024-05-06 DIAGNOSIS — R0602 Shortness of breath: Secondary | ICD-10-CM

## 2024-05-06 DIAGNOSIS — I110 Hypertensive heart disease with heart failure: Secondary | ICD-10-CM | POA: Diagnosis not present

## 2024-05-06 DIAGNOSIS — I10 Essential (primary) hypertension: Secondary | ICD-10-CM | POA: Diagnosis not present

## 2024-05-06 DIAGNOSIS — I2510 Atherosclerotic heart disease of native coronary artery without angina pectoris: Secondary | ICD-10-CM | POA: Diagnosis not present

## 2024-05-06 DIAGNOSIS — Z8249 Family history of ischemic heart disease and other diseases of the circulatory system: Secondary | ICD-10-CM | POA: Insufficient documentation

## 2024-05-06 DIAGNOSIS — Z8619 Personal history of other infectious and parasitic diseases: Secondary | ICD-10-CM | POA: Diagnosis not present

## 2024-05-06 DIAGNOSIS — I429 Cardiomyopathy, unspecified: Secondary | ICD-10-CM

## 2024-05-06 DIAGNOSIS — I509 Heart failure, unspecified: Secondary | ICD-10-CM

## 2024-05-06 DIAGNOSIS — J449 Chronic obstructive pulmonary disease, unspecified: Secondary | ICD-10-CM | POA: Diagnosis not present

## 2024-05-06 MED ORDER — CABOTEGRAVIR & RILPIVIRINE ER 600 & 900 MG/3ML IM SUER
1.0000 | Freq: Once | INTRAMUSCULAR | Status: AC
  Administered 2024-05-06: 1 via INTRAMUSCULAR

## 2024-05-06 NOTE — Patient Instructions (Signed)
 Today, you came in for your regular Cabenuva  injection for HIV treatment. We discussed your current health status, including your HIV, COPD, cardiomyopathy, hypertension, chronic low back pain, and irritable bowel syndrome. We also reviewed your recent blood test results and your current medications.  YOUR PLAN:  -HIV INFECTION: Your HIV is well-controlled with Cabenuva  injections, as shown by your last blood test in March 2025, which indicated an undetectable viral load and a T cell count over 400. Continue with your bimonthly Cabenuva  injections, and we will schedule your next blood test a week before your July injection.  -CARDIOMYOPATHY: Cardiomyopathy is a condition where the heart muscle is weakened, leading to decreased heart function. You have been experiencing symptoms like shortness of breath, fatigue, and leg swelling. We will refer you to cardiologist Dr. Alvenia Aus for further evaluation and order an echocardiogram to assess your current heart function.  -CHRONIC OBSTRUCTIVE PULMONARY DISEASE (COPD): COPD is a chronic lung condition that makes it hard to breathe. You are managing it with Combivent and Breztri inhalers, which you find helpful. Continue using these inhalers as prescribed.  -HYPERTENSION: Hypertension, or high blood pressure, is being managed with amlodipine . Continue taking your medication as prescribed.  -CHRONIC LOW BACK PAIN: Your chronic low back pain is being managed with MS Contin . Continue taking your medication as prescribed.  -IRRITABLE BOWEL SYNDROME WITH CONSTIPATION: Irritable bowel syndrome with constipation is being managed with Amitiza . Continue taking your medication as prescribed.  INSTRUCTIONS:  Please schedule a follow-up appointment with cardiologist Dr. Alvenia Aus and arrange for your next HIV blood test a week before your July Cabenuva  injection.

## 2024-05-06 NOTE — Progress Notes (Signed)
 NAME: Albert Wong  DOB: 1956-12-15  MRN: [id number]  Date/Time: 05/06/2024 11:54 AM   Subjective:  Albert Wong is a 68 y.o.male  with a history of HIV disease diagnosed in early 80s , Treated hepatitis C , COPD, , on MS contin  for back pain HTN, hyperlipidemia Pt is here for follow up for HIV. For cabenuva - doing well-   His last blood test in March 2025 showed an undetectable viral load and a T cell count over 400. He reports no issues with his current HIV treatment regimen.  He c/o  worsening shortness of breath and fatigue over the past couple of years. Dizziness and breathlessness occur with minimal exertion, necessitating frequent rest. He has a history of COPD and uses Combivent and Breztri inhalers, which he finds helpful. He also reports visual pulsations and weakness, nearly leading to syncope.  A cardiac catheterization in 2022 showednon obstructive cad and reduced ejection fraction of 35-45%. Albert Wong He notes intermittent leg swelling, numbness, and coldness in his calves and feet.  He is on amlodipine  for blood pressure, MS Contin  for back pain, baclofen  as a muscle relaxant, and Amitiza  for bowel issues related to irritable bowel syndrome and constipation from morphine  use. He recently completed a course of amoxicillin/clav for a throat infection In April 2025, which resolved his symptoms.  He has quit smoking and drinking. His family history includes a brother with similar heart issues at age 55, while his mother was very active into her 90s without similar health issues.   06/01/21- First visit with me  HIV diagnosed  early 31s., says it was a routine blood test, - was in care at [facility] for many years and then went to see Dr.Fitzgerald in 2014-2019, then [facility]  Nadir Cd4 DK- on  08/14/1991 was 440 VL  OI -None  HAARt history First regimen  Azt based Atripla  Triumeq  Cabenuva  -He took his first dose of cabenuva  IM on 4/18-/24    Acquired thru sex with men Not sexually active in 20  years  Genotype unknown ? Past Medical History:  Diagnosis Date   Anxiety    Chronic nausea    COPD (chronic obstructive pulmonary disease) (HCC)    Degenerative disk disease    HIV (human immunodeficiency virus infection) (HCC)    HIV (human immunodeficiency virus infection) (HCC)    Insomnia    Muscle spasm    Rotator cuff tear     Past Surgical History:  Procedure Laterality Date   dental procedure N/A    LEFT HEART CATH AND CORONARY ANGIOGRAPHY N/A 09/16/2021   Procedure: LEFT HEART CATH AND CORONARY ANGIOGRAPHY;  Surgeon: Wenona Hamilton, MD;  Location: [facility] INVASIVE CV LAB;  Service: Cardiovascular;  Laterality: N/A;    Social History   Socioeconomic History   Marital status: Single    Spouse name: Not on file   Number of children: Not on file   Years of education: Not on file   Highest education level: Not on file  Occupational History   Not on file  Tobacco Use   Smoking status: Former    Current packs/day: 1.00    Average packs/day: 1 pack/day for 39.0 years (39.0 ttl pk-yrs)    Types: Cigarettes   Smokeless tobacco: Never   Tobacco comments:    Quit smoking 1 month ago by his report.  Vaping Use   Vaping status: Former   Devices: tried when they first came out for about 2 weeks  Substance and  Sexual Activity   Alcohol use: Not Currently   Drug use: No   Sexual activity: Not on file  Other Topics Concern   Not on file  Social History Narrative   Not on file   Social Drivers of Health   Financial Resource Strain: Not on file  Food Insecurity: No Food Insecurity (12/02/2022)   Hunger Vital Sign    Worried About Running Out of Food in the Last Year: Never true    Ran Out of Food in the Last Year: Never true  Transportation Needs: No Transportation Needs (12/02/2022)   PRAPARE - Administrator, Civil Service (Medical): No    Lack of Transportation (Non-Medical): No  Physical Activity: Not on file  Stress: Not on file  Social Connections:  Not on file  Intimate Partner Violence: Not At Risk (12/02/2022)   Humiliation, Afraid, Rape, and Kick questionnaire    Fear of Current or Ex-Partner: No    Emotionally Abused: No    Physically Abused: No    Sexually Abused: No    Family History  Problem Relation Age of Onset   Alzheimer's disease Mother    Breast cancer Mother    Heart disease Father   Mother breast cancer Allergies  Allergen Reactions   Dolutegravir -Lamivudine  Other (See Comments)    Severe Flu like symptoms  Sweating   Shortness of breath  Body aches-- Doubt this is Dovato  allergy as he is tolerating truimeq which is dovato  without abacavir   Other Reaction(s): Other (See Comments)  Severe Flu like symptoms Sweating  Shortness of breath Body aches-- Doubt this is Dovato  allergy as he is tolerating truimeq which is dovato  + abacavir    Entresto  [Sacubitril-Valsartan] Photosensitivity    Dizziness   Metoprolol  Succinate [Metoprolol ] Photosensitivity    Patients reported change in vision. D/c by pt pcp   ? Current Outpatient Medications  Medication Sig Dispense Refill   albuterol  (VENTOLIN  HFA) 108 (90 Base) MCG/ACT inhaler Inhale 2 puffs into the lungs every 6 (six) hours as needed for wheezing or shortness of breath. 6.7 g 0   amLODipine  (NORVASC ) 5 MG tablet Take 5 mg by mouth daily.     aspirin  EC 81 MG EC tablet Take 1 tablet (81 mg total) by mouth daily. Swallow whole. 30 tablet 2   baclofen  (LIORESAL ) 10 MG tablet TAKE 1 TABLET BY MOUTH EVERY DAY 30 tablet 11   BREZTRI AEROSPHERE 160-9-4.8 MCG/ACT AERO Inhale 2 puffs into the lungs 2 (two) times daily.     budesonide-formoterol (SYMBICORT) 80-4.5 MCG/ACT inhaler Inhale 2 puffs into the lungs 2 (two) times daily as needed.     cabotegravir  & rilpivirine  ER (CABENUVA ) 600 & 900 MG/3ML injection Inject 1 kit into the muscle every 30 (thirty) days. 6 mL 1   cabotegravir  & rilpivirine  ER (CABENUVA ) 600 & 900 MG/3ML injection Inject 1 kit into the muscle  every 2 (two) months. 6 mL 5   cromolyn  (OPTICROM ) 4 % ophthalmic solution      dicyclomine  (BENTYL ) 20 MG tablet Take 20 mg by mouth in the morning and at bedtime.     fluorometholone (FML) 0.1 % ophthalmic suspension SMARTSIG:In Eye(s)     Ipratropium-Albuterol  (COMBIVENT) 20-100 MCG/ACT AERS respimat Inhale into the lungs as directed.     ketoconazole (NIZORAL) 2 % cream Apply 1 application topically daily as needed for irritation.     lubiprostone  (AMITIZA ) 24 MCG capsule Take 24 mcg by mouth 2 (two) times daily.  montelukast (SINGULAIR) 10 MG tablet Take 10 mg by mouth daily.     morphine  (MS CONTIN ) 30 MG 12 hr tablet Take 1 tablet (30 mg total) by mouth 2 (two) times daily. 60 tablet 0   mupirocin ointment (BACTROBAN) 2 % SMARTSIG:1 Sparingly Topical Twice Daily PRN     spironolactone  (ALDACTONE ) 25 MG tablet Take 25 mg by mouth 2 (two) times daily.     triamcinolone cream (KENALOG) 0.1 % Apply 1 Application topically 3 (three) times daily.     VITAMIN D  PO Take 1,000 Units by mouth daily.     No current facility-administered medications for this visit.    REVIEW OF SYSTEMS:  Const: negative fever, negative chills, has gained 30 pounds ENT: negative coryza, negative sore throat Resp: has copd , doing better- off oxygen Cards:SOB on minimal exertion GU: negative for frequency, dysuria and hematuria Skin: no rash Heme: negative for easy bruising and gum/nose bleeding MS: back pain worse in the past 3 days Neurolo:negative for headaches, dizziness, vertigo, memory problems  Psych: PTSD  Objective:  VITALS: BP (!) 152/69   Pulse 83   Temp 98.2 F (36.8 C) (Temporal)   Ht 5\' 10"  (1.778 m)   Wt 178 lb 6.4 oz (80.9 kg)   SpO2 90%   BMI 25.60 kg/m    PHYSICAL EXAM:  General: Alert, cooperative, no distress, appears stated age.  Head: Normocephalic, without obvious abnormality, atraumatic. Eyes: Conjunctivae clear, anicteric sclerae. Pupils are equal Full set of  dentures Neck: Supple, symmetrical, no adenopathy, thyroid : non tender no carotid bruit and no JVD. Lungs B/l air entry- no crepts heart:s1s2 Abdomen: soft Extremities: no ankle edema Lymph: Cervical, supraclavicular normal. Neurologic: Grossly non-focal IMAGING RESULTS: Health maintenance Vaccination  Vaccine Date last given comment  Influenza    Hepatitis B Has antibodies   Hepatitis A    Prevnar-PCV-20 03/01/23   Pneumovac-PPSV-23 2020   TdaP 2021   HPV    Shingrix ( zoster vaccine)    RSV 09/14/22 ______________________  Labs Lab Result  Date comment  HIV VL <13 March 2024   CD4 496 11/01/23   Genotype     [id number] NR    HIV antibody     RPR NR 03/05/24   Quantiferon Gold Negative 03/01/23   Hep C ab reactive    Hepatitis B-ab,ag,c Sab > 12.7    Hepatitis A-IgM, IgG /T Total-pos    Lipid TC 138, LDL 90, TGL 61, HDL 36    GC/CHL          HB,PLT,Cr, LFT 13.7, Plt 205      Preventive  Procedure Result  Date comment  colonoscopy  2018        Dental exam     Opthal       Impression/Recommendation ? ?HIV disease- he is well controlled- Vl < 20 and cd4 is 496 on injectable cabenuva  Doing well- continu cabenuva   Worsening SOB Has cardiomyopathy in 2023 echo with EF 45-50%, LV global hypokinesis Diastolic dysfunction Cardiac cath was in 2022- non obstructive CAD Will refer to cardiology  Ex Smoker-  HTN on amlodipine  5mg  -   HLD- was on rosuvastatin - he stopped because he was losing weight and does not want to take it anymore  Not sexually active in 20 yrs and does not want anal pap or STD screen    Treated Hepatits C- HCV RNA not detected  COPD- on inhalers, using   oxygen this week because of the heat and  humidity followed by Albert Wong,    Low back ache - on MS contin -[facility]   Follow up in 2  months Will get labs before his visit Refer to cardiology Dr.Arida

## 2024-05-08 ENCOUNTER — Ambulatory Visit: Attending: Medical | Admitting: Medical

## 2024-05-08 ENCOUNTER — Encounter: Payer: Self-pay | Admitting: Medical

## 2024-05-08 VITALS — BP 136/74 | HR 100 | Ht 70.0 in | Wt 178.0 lb

## 2024-05-08 DIAGNOSIS — I5022 Chronic systolic (congestive) heart failure: Secondary | ICD-10-CM

## 2024-05-08 DIAGNOSIS — E782 Mixed hyperlipidemia: Secondary | ICD-10-CM

## 2024-05-08 DIAGNOSIS — Z79899 Other long term (current) drug therapy: Secondary | ICD-10-CM

## 2024-05-08 DIAGNOSIS — I2510 Atherosclerotic heart disease of native coronary artery without angina pectoris: Secondary | ICD-10-CM | POA: Diagnosis not present

## 2024-05-08 DIAGNOSIS — I428 Other cardiomyopathies: Secondary | ICD-10-CM

## 2024-05-08 DIAGNOSIS — R0609 Other forms of dyspnea: Secondary | ICD-10-CM | POA: Diagnosis not present

## 2024-05-08 DIAGNOSIS — Z21 Asymptomatic human immunodeficiency virus [HIV] infection status: Secondary | ICD-10-CM

## 2024-05-08 MED ORDER — ROSUVASTATIN CALCIUM 20 MG PO TABS: 20.0000 mg | ORAL_TABLET | Freq: Every day | ORAL | 3 refills | Status: AC

## 2024-05-08 NOTE — Patient Instructions (Signed)
 Medication Instructions:  Your Physician recommend you continue on your current medication as directed.    *If you need a refill on your cardiac medications before your next appointment, please call your pharmacy*  Lab Work: Your provider would like for you to have following labs drawn today CBC BMP BNP TSH If you have labs (blood work) drawn today and your tests are completely normal, you will receive your results only by: MyChart Message (if you have MyChart) OR A paper copy in the mail If you have any lab test that is abnormal or we need to change your treatment, we will call you to review the results.  Testing/Procedures: Your physician has requested that you have an echocardiogram. Echocardiography is a painless test that uses sound waves to create images of your heart. It provides your doctor with information about the size and shape of your heart and how well your heart's chambers and valves are working.   You may receive an ultrasound enhancing agent through an IV if needed to better visualize your heart during the echo. This procedure takes approximately one hour.  There are no restrictions for this procedure.  This will take place at [address] [facility] [address] [facility]) [address], [place] [address]  Please note: We ask at that you not bring children with you during ultrasound (echo/ vascular) testing. Due to room size and safety concerns, children are not allowed in the ultrasound rooms during exams. Our front office staff cannot provide observation of children in our lobby area while testing is being conducted. An adult accompanying a patient to their appointment will only be allowed in the ultrasound room at the discretion of the ultrasound technician under special circumstances. We apologize for any inconvenience.   Follow-Up: At [facility], you and your health needs are our priority.  As part of our continuing mission to provide you with exceptional heart care, our  providers are all part of one team.  This team includes your primary Cardiologist (physician) and Advanced Practice Providers or APPs (Physician Assistants and Nurse Practitioners) who all work together to provide you with the care you need, when you need it.  Your next appointment:   2 month(s)  Provider:   Toribio Frees, PA-C

## 2024-05-08 NOTE — Progress Notes (Signed)
 Cardiology Office Note:  .   Date:  05/08/2024  ID:  Albert Wong, DOB [birth year], MRN [id number] PCP: Benuel Brazier, MD  [facility] Providers Cardiologist:  Antionette Kirks, MD {   History of Present Illness: .   Albert Wong is a 68 y.o. male with a hx of HIV disease diagnosed in early 77s, treated hepatitis, COPD, NICM, HFmrEF, nonobstructive CAD by cath 08/2021, HTN, and tobacco use who is being seen for follow-up for CHF and CAD.    Patient came into the [facility] ER 09/16/21 for shortness of breath. Patient's HIV medications were recently switched to Dovato .Hst troponin was elevated. LHC showed mild to moderate nonobstructive CAD with moderately reduced LVSF, mildly elevated LVEDP. Started on Aspirin , Lopressor , crestor , jardiance, lasix  20mg  daily. Echo showed LVEF 40-45%, g1dd. He was started on GDMT. He stopped metoprolol  due to side affects.    Seen 11/29/21 in televisit and was overall doing better. Limited echo was ordered which showed minimal improvement of LVEF to 45-50%.    The patient was last seen 08/2022 reporting stable chest pain and SOB. The patient was started on spironlactone.  Today, thea patient reports SOB with exertion. This has been going on for 2 years. He has COPD and just finished abx for cough but still feels SOB. He denies chest pain. He reports lower leg edema that is worse at night. He is on amlodipine  10mg  daily and is reluctant to decrease this. Says he did not feel good on lots of different medications.  Studies Reviewed: Aaron Aas   EKG Interpretation Date/Time:  Thursday May 08 2024 15:01:52 EDT Ventricular Rate:  100 PR Interval:  172 QRS Duration:  84 QT Interval:  350 QTC Calculation: 451 R Axis:   8  Text Interpretation: Normal sinus rhythm Normal ECG When compared with ECG of 10-Jul-2023 16:45, PREVIOUS ECG IS PRESENT Confirmed by Gennaro Khat, Seymore Brodowski ([id number]) on 05/08/2024 3:23:17 PM    Echo limited 12/2021  1. Left ventricular ejection fraction,  by estimation, is 45 to 50%. The  left ventricle has mildly decreased function. The left ventricle  demonstrates global hypokinesis. Left ventricular diastolic parameters are  consistent with Grade I diastolic  dysfunction (impaired relaxation).   2. Right ventricular systolic function is normal. The right ventricular  size is normal. Tricuspid regurgitation signal is inadequate for assessing  PA pressure.   3. The mitral valve is normal in structure. No evidence of mitral valve  regurgitation. No evidence of mitral stenosis.   4. The aortic valve is normal in structure. Aortic valve regurgitation is  not visualized. No aortic stenosis is present.   5. The inferior vena cava is normal in size with greater than 50%  respiratory variability, suggesting right atrial pressure of 3 mmHg     Echo 08/2022 1. Left ventricular ejection fraction, by estimation, is 40 to 45%. The  left ventricle has mildly decreased function. The left ventricle  demonstrates global hypokinesis. Left ventricular diastolic parameters are  consistent with Grade I diastolic  dysfunction (impaired relaxation).   2. Right ventricular systolic function is mildly reduced. The right  ventricular size is mildly enlarged. Tricuspid regurgitation signal is  inadequate for assessing PA pressure.   3. The inferior vena cava is dilated in size with >50% respiratory  variability, suggesting right atrial pressure of 8 mmHg.    Cardiac cath 09/16/21   Mid Cx lesion is 40% stenosed.   Mid RCA lesion is 30% stenosed.   Dist RCA lesion  is 40% stenosed.   There is mild left ventricular systolic dysfunction.   LV end diastolic pressure is moderately elevated.   The left ventricular ejection fraction is 35-45% by visual estimate.   1.  Mild to moderate nonobstructive coronary artery disease. 2.  Moderately reduced LV systolic function with wall motion abnormality suggestive of stress-induced cardiomyopathy.  Mildly elevated left  ventricular end-diastolic pressure.   Recommendations: Recommend medical therapy for nonobstructive coronary artery disease. Obtain an echocardiogram to better evaluate wall motion.     Physical Exam:   VS:  BP 136/74 (BP Location: Left Arm)   Pulse 100   Ht 5\' 10"  (1.778 m)   Wt 178 lb (80.7 kg)   SpO2 93%   BMI 25.54 kg/m    Wt Readings from Last 3 Encounters:  05/08/24 178 lb (80.7 kg)  05/06/24 178 lb 6.4 oz (80.9 kg)  03/11/24 178 lb (80.7 kg)    GEN: Well nourished, well developed in no acute distress NECK: No JVD; No carotid bruits CARDIAC: RRR, no murmurs, rubs, gallops RESPIRATORY:  Clear to auscultation without rales, wheezing or rhonchi  ABDOMEN: Soft, non-tender, non-distended EXTREMITIES:  No edema; No deformity   ASSESSMENT AND PLAN: .    DOE HFrEF NICM Patient reports DOE for the last 2 years and lower leg edema. EF 35-45% on cath in 2022.  Most recent echo 12/2021 showed LVEF 45-50%, G1DD. Patient does not appear volume up on exam. He is on amlodipine  10mg  daily and is reluctant to decrease this or change medications. Appears he was previously on farxiga  and spiro, but has stopped these. He is also not taking Losartan . He did not tolerate metoprolol  in the past. I will check CBC, BMET, TSH and BNP. I will also update an echocardiogram. We will re-evaluate medications after the echo.   Nonobstructive CAD The patient denies chest pain, but has DOE as above. Check echo as above. Continue ASA. He stopped statin.   HLD LDL 129. He is no longer on a statin. I will restart Crestor  20mg  daily. Can re-check LFTs/lipids at follow-up.  Smoker/COPD He stopped smoking.   HIV He is followed by ID.     Dispo: follow-up in 2 months  Signed, Shellsea Borunda Rebekah Canada, PA-C

## 2024-05-09 ENCOUNTER — Ambulatory Visit: Payer: Self-pay | Admitting: Medical

## 2024-05-09 LAB — BASIC METABOLIC PANEL WITH GFR
BUN/Creatinine Ratio: 14 (ref 10–24)
BUN: 15 mg/dL (ref 8–27)
CO2: 22 mmol/L (ref 20–29)
Calcium: 10 mg/dL (ref 8.6–10.2)
Chloride: 103 mmol/L (ref 96–106)
Creatinine, Ser: 1.04 mg/dL (ref 0.76–1.27)
Glucose: 107 mg/dL — ABNORMAL HIGH (ref 70–99)
Potassium: 4.8 mmol/L (ref 3.5–5.2)
Sodium: 141 mmol/L (ref 134–144)
eGFR: 78 mL/min/{1.73_m2} (ref >59)

## 2024-05-09 LAB — CBC
Hematocrit: 47.9 % (ref 37.5–51.0)
Hemoglobin: 16.3 g/dL (ref 13.0–17.7)
MCH: 30.4 pg (ref 26.6–33.0)
MCHC: 34 g/dL (ref 31.5–35.7)
MCV: 89 fL (ref 79–97)
Platelets: 362 10*3/uL (ref 150–450)
RBC: 5.37 x10E6/uL (ref 4.14–5.80)
RDW: 11.8 % (ref 11.6–15.4)
WBC: 8.6 10*3/uL (ref 3.4–10.8)

## 2024-05-09 LAB — BRAIN NATRIURETIC PEPTIDE: BNP: 22.5 pg/mL (ref 0.0–100.0)

## 2024-05-09 LAB — TSH: TSH: 1.84 u[IU]/mL (ref 0.450–4.500)

## 2024-05-22 ENCOUNTER — Ambulatory Visit: Attending: Medical

## 2024-05-22 DIAGNOSIS — I2510 Atherosclerotic heart disease of native coronary artery without angina pectoris: Secondary | ICD-10-CM | POA: Diagnosis not present

## 2024-05-22 LAB — ECHOCARDIOGRAM COMPLETE
AR max vel: 3.59 cm2
AV Area VTI: 3.77 cm2
AV Area mean vel: 3.67 cm2
AV Mean grad: 2 mmHg
AV Peak grad: 4.2 mmHg
Ao pk vel: 1.03 m/s
Area-P 1/2: 3.91 cm2
Calc EF: 51.1 %
S' Lateral: 4.2 cm
Single Plane A2C EF: 50 %
Single Plane A4C EF: 47.2 %

## 2024-05-27 ENCOUNTER — Other Ambulatory Visit: Payer: Self-pay | Admitting: Family Medicine

## 2024-05-27 DIAGNOSIS — Z0000 Encounter for general adult medical examination without abnormal findings: Secondary | ICD-10-CM

## 2024-05-28 ENCOUNTER — Other Ambulatory Visit: Payer: Self-pay | Admitting: Family Medicine

## 2024-05-28 DIAGNOSIS — F17200 Nicotine dependence, unspecified, uncomplicated: Secondary | ICD-10-CM

## 2024-06-05 ENCOUNTER — Ambulatory Visit
Admission: RE | Admit: 2024-06-05 | Discharge: 2024-06-05 | Disposition: A | Discharge status: Home / Self Care | Source: Ambulatory Visit | Attending: Family Medicine | Admitting: Family Medicine

## 2024-06-05 DIAGNOSIS — I700 Atherosclerosis of aorta: Secondary | ICD-10-CM | POA: Insufficient documentation

## 2024-06-05 DIAGNOSIS — I2510 Atherosclerotic heart disease of native coronary artery without angina pectoris: Secondary | ICD-10-CM | POA: Insufficient documentation

## 2024-06-05 DIAGNOSIS — F17200 Nicotine dependence, unspecified, uncomplicated: Secondary | ICD-10-CM

## 2024-06-05 DIAGNOSIS — Z122 Encounter for screening for malignant neoplasm of respiratory organs: Secondary | ICD-10-CM | POA: Diagnosis present

## 2024-06-05 DIAGNOSIS — J439 Emphysema, unspecified: Secondary | ICD-10-CM | POA: Insufficient documentation

## 2024-06-05 DIAGNOSIS — Z87891 Personal history of nicotine dependence: Secondary | ICD-10-CM | POA: Insufficient documentation

## 2024-06-05 DIAGNOSIS — R918 Other nonspecific abnormal finding of lung field: Secondary | ICD-10-CM | POA: Diagnosis not present

## 2024-06-23 ENCOUNTER — Ambulatory Visit: Admitting: Physician Assistant

## 2024-06-26 ENCOUNTER — Other Ambulatory Visit: Payer: Self-pay

## 2024-06-26 ENCOUNTER — Other Ambulatory Visit (HOSPITAL_COMMUNITY): Payer: Self-pay

## 2024-06-26 NOTE — Progress Notes (Signed)
 Specialty Pharmacy Refill Coordination Note  Albert Wong is a 68 y.o. male assessed today regarding refills of clinic administered specialty medication(s) Cabotegravir  & Rilpivirine  (CABENUVA )   Clinic requested Courier to Provider Office   Delivery date: 07/01/24   Verified address: [address] [place] [address]   Medication will be filled on 06/30/24.

## 2024-06-30 ENCOUNTER — Other Ambulatory Visit: Payer: Self-pay

## 2024-07-08 ENCOUNTER — Encounter: Payer: Self-pay | Admitting: Infectious Diseases

## 2024-07-08 ENCOUNTER — Ambulatory Visit: Admitting: Medical

## 2024-07-08 ENCOUNTER — Ambulatory Visit: Attending: Infectious Diseases | Admitting: Infectious Diseases

## 2024-07-08 VITALS — BP 133/71 | HR 86 | Temp 97.7°F | Ht 70.0 in | Wt 181.0 lb

## 2024-07-08 DIAGNOSIS — I10 Essential (primary) hypertension: Secondary | ICD-10-CM | POA: Insufficient documentation

## 2024-07-08 DIAGNOSIS — Z87891 Personal history of nicotine dependence: Secondary | ICD-10-CM | POA: Diagnosis not present

## 2024-07-08 DIAGNOSIS — R0602 Shortness of breath: Secondary | ICD-10-CM | POA: Diagnosis not present

## 2024-07-08 DIAGNOSIS — M545 Low back pain, unspecified: Secondary | ICD-10-CM | POA: Insufficient documentation

## 2024-07-08 DIAGNOSIS — E785 Hyperlipidemia, unspecified: Secondary | ICD-10-CM | POA: Diagnosis not present

## 2024-07-08 DIAGNOSIS — J449 Chronic obstructive pulmonary disease, unspecified: Secondary | ICD-10-CM | POA: Diagnosis not present

## 2024-07-08 DIAGNOSIS — I429 Cardiomyopathy, unspecified: Secondary | ICD-10-CM | POA: Insufficient documentation

## 2024-07-08 DIAGNOSIS — Z8619 Personal history of other infectious and parasitic diseases: Secondary | ICD-10-CM | POA: Diagnosis not present

## 2024-07-08 DIAGNOSIS — Z79899 Other long term (current) drug therapy: Secondary | ICD-10-CM | POA: Diagnosis not present

## 2024-07-08 DIAGNOSIS — B20 Human immunodeficiency virus [HIV] disease: Secondary | ICD-10-CM | POA: Insufficient documentation

## 2024-07-08 MED ORDER — CABOTEGRAVIR & RILPIVIRINE ER 600 & 900 MG/3ML IM SUER
1.0000 | Freq: Once | INTRAMUSCULAR | Status: AC
  Administered 2024-07-08: 1 via INTRAMUSCULAR

## 2024-07-08 NOTE — Progress Notes (Signed)
 NAME: Albert Wong  DOB: 1956-12-10  MRN: [id number]  Date/Time: 07/08/2024 10:58 AM   Subjective:  Albert Wong is a 68 y.o.male  with a history of HIV disease diagnosed in early 80s , Treated hepatitis C , COPD, , on MS contin  for back pain HTN, hyperlipidemia Pt is here for follow up for HIV. onr cabenuva - doing well-  Since his last visit he had 2 d echo ( N) , CT lung low dose some 15 mm nodules, US  abdomen no AAA His PCP changed some meds- DC amlodipine  and aldactone  and he is now on Losartan   His last blood test in March 2025 showed an undetectable viral load and a T cell count over 400 was from Nov 2024 . He reports no issues with his current HIV treatment regimen.  He c/o  worsening shortness of breath and fatigue over the past couple of years. Dizziness and breathlessness occur with minimal exertion, necessitating frequent rest. He has a history of COPD and uses Combivent and Breztri inhalers, which he finds helpful. He also reports visual pulsations and weakness, nearly leading to syncope.  A cardiac catheterization in 2022 showed non obstructive cad      MS Contin  for back pain, baclofen  as a muscle relaxant, and Amitiza  for bowel issues related to irritable bowel syndrome and constipation from morphine  use.   He has quit smoking and drinking. His family history includes a brother with similar heart issues at age 50, while his mother was very active into her 90s without similar health issues.   06/01/21- First visit with me  HIV diagnosed  early 55s., says it was a routine blood test, - was in care [facility] for many years and then went to see Dr.Fitzgerald in 2014-2019, then [facility]  Nadir Cd4 DK- on  08/14/1991 was 440 VL  OI -None  HAARt history First regimen  Azt based Atripla  Triumeq  Cabenuva  -He took his first dose of cabenuva  IM on 4/18-/24    Acquired thru sex with men Not sexually active in 20 years  Genotype unknown ? Past Medical History:  Diagnosis Date    Anxiety    Chronic nausea    COPD (chronic obstructive pulmonary disease) (HCC)    Degenerative disk disease    HIV (human immunodeficiency virus infection) (HCC)    HIV (human immunodeficiency virus infection) (HCC)    Insomnia    Muscle spasm    Rotator cuff tear     Past Surgical History:  Procedure Laterality Date   dental procedure N/A    LEFT HEART CATH AND CORONARY ANGIOGRAPHY N/A 09/16/2021   Procedure: LEFT HEART CATH AND CORONARY ANGIOGRAPHY;  Surgeon: Darron Deatrice LABOR, MD;  Location: [facility] INVASIVE CV LAB;  Service: Cardiovascular;  Laterality: N/A;    Social History   Socioeconomic History   Marital status: Single    Spouse name: Not on file   Number of children: Not on file   Years of education: Not on file   Highest education level: Not on file  Occupational History   Not on file  Tobacco Use   Smoking status: Former    Current packs/day: 1.00    Average packs/day: 1 pack/day for 39.0 years (39.0 ttl pk-yrs)    Types: Cigarettes   Smokeless tobacco: Never   Tobacco comments:    Quit smoking 1 month ago by his report.  Vaping Use   Vaping status: Former   Devices: tried when they first came out for about  2 weeks  Substance and Sexual Activity   Alcohol use: Not Currently   Drug use: No   Sexual activity: Not on file  Other Topics Concern   Not on file  Social History Narrative   Not on file   Social Drivers of Health   Financial Resource Strain: Not on file  Food Insecurity: No Food Insecurity (12/02/2022)   Hunger Vital Sign    Worried About Running Out of Food in the Last Year: Never true    Ran Out of Food in the Last Year: Never true  Transportation Needs: No Transportation Needs (12/02/2022)   PRAPARE - Administrator, Civil Service (Medical): No    Lack of Transportation (Non-Medical): No  Physical Activity: Not on file  Stress: Not on file  Social Connections: Not on file  Intimate Partner Violence: Not At Risk (12/02/2022)    Humiliation, Afraid, Rape, and Kick questionnaire    Fear of Current or Ex-Partner: No    Emotionally Abused: No    Physically Abused: No    Sexually Abused: No    Family History  Problem Relation Age of Onset   Alzheimer's disease Mother    Breast cancer Mother    Heart disease Father   Mother breast cancer Allergies  Allergen Reactions   Dolutegravir -Lamivudine  Other (See Comments)    Severe Flu like symptoms  Sweating   Shortness of breath  Body aches-- Doubt this is Dovato  allergy as he is tolerating truimeq which is dovato  without abacavir   Other Reaction(s): Other (See Comments)  Severe Flu like symptoms Sweating  Shortness of breath Body aches-- Doubt this is Dovato  allergy as he is tolerating truimeq which is dovato  + abacavir    Entresto  [Sacubitril-Valsartan] Photosensitivity    Dizziness   Metoprolol  Succinate [Metoprolol ] Photosensitivity    Patients reported change in vision. D/c by pt pcp   ? Current Outpatient Medications  Medication Sig Dispense Refill   albuterol  (VENTOLIN  HFA) 108 (90 Base) MCG/ACT inhaler Inhale 2 puffs into the lungs every 6 (six) hours as needed for wheezing or shortness of breath. 6.7 g 0   aspirin  EC 81 MG EC tablet Take 1 tablet (81 mg total) by mouth daily. Swallow whole. 30 tablet 2   baclofen  (LIORESAL ) 10 MG tablet TAKE 1 TABLET BY MOUTH EVERY DAY 30 tablet 11   BREZTRI AEROSPHERE 160-9-4.8 MCG/ACT AERO Inhale 2 puffs into the lungs 2 (two) times daily.     budesonide-formoterol (SYMBICORT) 80-4.5 MCG/ACT inhaler Inhale 2 puffs into the lungs 2 (two) times daily as needed.     cabotegravir  & rilpivirine  ER (CABENUVA ) 600 & 900 MG/3ML injection Inject 1 kit into the muscle every 2 (two) months. 6 mL 5   cromolyn  (OPTICROM ) 4 % ophthalmic solution      dicyclomine  (BENTYL ) 20 MG tablet Take 20 mg by mouth in the morning and at bedtime.     fluorometholone (FML) 0.1 % ophthalmic suspension SMARTSIG:In Eye(s)     Ipratropium-Albuterol   (COMBIVENT) 20-100 MCG/ACT AERS respimat Inhale into the lungs as directed.     ketoconazole (NIZORAL) 2 % cream Apply 1 application topically daily as needed for irritation.     lisinopril-hydrochlorothiazide (ZESTORETIC) 20-12.5 MG tablet Take 1 tablet by mouth daily.     lubiprostone  (AMITIZA ) 24 MCG capsule Take 24 mcg by mouth 2 (two) times daily.     montelukast (SINGULAIR) 10 MG tablet Take 10 mg by mouth daily.     morphine  (MS CONTIN ) 30 MG  12 hr tablet Take 1 tablet (30 mg total) by mouth 2 (two) times daily. 60 tablet 0   mupirocin ointment (BACTROBAN) 2 % SMARTSIG:1 Sparingly Topical Twice Daily PRN     rosuvastatin  (CRESTOR ) 20 MG tablet Take 1 tablet (20 mg total) by mouth daily. 90 tablet 3   tamsulosin  (FLOMAX ) 0.4 MG CAPS capsule Take 0.4 mg by mouth daily.     triamcinolone cream (KENALOG) 0.1 % Apply 1 Application topically 3 (three) times daily.     VITAMIN D  PO Take 1,000 Units by mouth daily.     No current facility-administered medications for this visit.    REVIEW OF SYSTEMS:  Const: negative fever, negative chills, has gained 30 pounds ENT: negative coryza, negative sore throat Resp: has copd , doing better- off oxygen Cards:SOB on minimal exertion GU: negative for frequency, dysuria and hematuria Skin: no rash Heme: negative for easy bruising and gum/nose bleeding MS: back pain worse in the past 3 days Neurolo:negative for headaches, dizziness, vertigo, memory problems  Psych: PTSD  Objective:  VITALS: BP 133/71   Pulse 86   Temp 97.7 F (36.5 C) (Temporal)   Ht 5' 10 (1.778 m)   Wt 181 lb (82.1 kg)   SpO2 91%   BMI 25.97 kg/m    PHYSICAL EXAM:  General: Alert, cooperative, no distress, appears stated age.  Head: Normocephalic, without obvious abnormality, atraumatic. Eyes: Conjunctivae clear, anicteric sclerae. Pupils are equal Full set of dentures Neck: Supple, symmetrical, no adenopathy, thyroid : non tender no carotid bruit and no JVD. Lungs  B/l air entry- few rhonchi heart:s1s2 Abdomen: soft Extremities: no ankle edema Lymph: Cervical, supraclavicular normal. Neurologic: Grossly non-focal IMAGING RESULTS: Health maintenance Vaccination  Vaccine Date last given comment  Influenza    Hepatitis B Has antibodies   Hepatitis A    Prevnar-PCV-20 03/01/23   Pneumovac-PPSV-23 2020   TdaP 2021   HPV    Shingrix ( zoster vaccine)    RSV 09/14/22 ______________________  Labs Lab Result  Date comment  HIV VL <13 March 2024   CD4 496 11/01/23   Genotype     [id number] NR    HIV antibody     RPR NR 03/05/24   Quantiferon Gold Negative 03/01/23   Hep C ab reactive    Hepatitis B-ab,ag,c Sab > 12.7    Hepatitis A-IgM, IgG /T Total-pos    Lipid TC 138, LDL 90, TGL 61, HDL 36    GC/CHL          HB,PLT,Cr, LFT 13.7, Plt 205      Preventive  Procedure Result  Date comment  colonoscopy  2018        Dental exam     Opthal       Impression/Recommendation ? ?HIV disease- he is well controlled- Vl < 20 and cd4 is 496 on injectable cabenuva  Doing well- continu cabenuva   Worsening SOB Has cardiomyopathy in 2023 echo with EF 45-50%, LV global hypokinesis Diastolic dysfunction Cardiac cath was in 2022- non obstructive CAD Saw cardiology in May 2025- Repeat 2 d echo 5/29 EF 50-55%  Ex Smoker-  HTN on Losartan  /HCTZ ( amlodipine  and aldactone  Dc)  HLD- was on rosuvastatin - he stopped because he was losing weight and does not want to take it anymore  Not sexually active in 21 yrs and does not want anal pap or STD screen    Treated Hepatits C- HCV RNA not detected  COPD- on inhalers, using   oxygen this week  because of the heat and humidity    Low back ache - on MS contin -[facility] ? Labs today- follow up 6 months

## 2024-07-14 ENCOUNTER — Other Ambulatory Visit: Payer: Self-pay | Admitting: Family Medicine

## 2024-07-14 DIAGNOSIS — R918 Other nonspecific abnormal finding of lung field: Secondary | ICD-10-CM

## 2024-07-21 ENCOUNTER — Encounter

## 2024-07-22 ENCOUNTER — Ambulatory Visit

## 2024-07-25 ENCOUNTER — Encounter: Payer: Self-pay | Admitting: Medical

## 2024-07-25 ENCOUNTER — Ambulatory Visit: Attending: Medical | Admitting: Medical

## 2024-07-25 VITALS — BP 132/73 | HR 81 | Ht 70.0 in | Wt 180.2 lb

## 2024-07-25 DIAGNOSIS — F17200 Nicotine dependence, unspecified, uncomplicated: Secondary | ICD-10-CM

## 2024-07-25 DIAGNOSIS — I5032 Chronic diastolic (congestive) heart failure: Secondary | ICD-10-CM | POA: Diagnosis not present

## 2024-07-25 DIAGNOSIS — J441 Chronic obstructive pulmonary disease with (acute) exacerbation: Secondary | ICD-10-CM

## 2024-07-25 DIAGNOSIS — Z79899 Other long term (current) drug therapy: Secondary | ICD-10-CM | POA: Diagnosis not present

## 2024-07-25 DIAGNOSIS — I2510 Atherosclerotic heart disease of native coronary artery without angina pectoris: Secondary | ICD-10-CM | POA: Diagnosis not present

## 2024-07-25 DIAGNOSIS — E782 Mixed hyperlipidemia: Secondary | ICD-10-CM | POA: Diagnosis not present

## 2024-07-25 NOTE — Progress Notes (Signed)
 Cardiology Office Note   Date:  07/25/2024  ID:  Albert Wong, DOB 05-29-1956, MRN [id number] PCP: Odell Chard, Edra GRADE, MD  [facility] Providers Cardiologist:  Deatrice Cage, MD   History of Present Illness Albert Wong is a 68 y.o. male with a hx of HIV disease diagnosed in early 24s, treated hepatitis, COPD, NICM, HFmrEF, nonobstructive CAD by cath 08/2021, HTN, and tobacco use who is being seen for follow-up for CHF and CAD.    Patient [facility] into [facility] 09/16/21 for shortness of breath. Patient's HIV medications were recently switched to Dovato .Hst troponin was elevated. LHC showed mild to moderate nonobstructive CAD with moderately reduced LVSF, mildly elevated LVEDP. Started on Aspirin , Lopressor , crestor , jardiance, lasix  20mg  daily. Echo showed LVEF 40-45%, g1dd. He was started on GDMT. He stopped metoprolol  due to side affects.  Limited echo showed minimal improvement of LVEF to 45-50%.   Patient was last seen 04/2024 reporting shortness of breath with exertion that was chronic.  He recently finished antibiotics.  Patient appeared euvolemic on exam.  Labs were ordered and echocardiogram was updated.  Echocardiogram showed LVEF 50 to 55%, trivial MR.  Today, patient is overall doing OK. He was changed from amlodipine  to lisinopril-hydrochlorothiazide. He has SOB outside in the heat. He has rare ghost chest pains on the left side. He feels he gets pglegm when he goes outside and causes shortness of breath.   Studies Reviewed     Echo limited 12/2021  1. Left ventricular ejection fraction, by estimation, is 45 to 50%. The  left ventricle has mildly decreased function. The left ventricle  demonstrates global hypokinesis. Left ventricular diastolic parameters are  consistent with Grade I diastolic  dysfunction (impaired relaxation).   2. Right ventricular systolic function is normal. The right ventricular  size is normal. Tricuspid regurgitation signal is inadequate for  assessing  PA pressure.   3. The mitral valve is normal in structure. No evidence of mitral valve  regurgitation. No evidence of mitral stenosis.   4. The aortic valve is normal in structure. Aortic valve regurgitation is  not visualized. No aortic stenosis is present.   5. The inferior vena cava is normal in size with greater than 50%  respiratory variability, suggesting right atrial pressure of 3 mmHg     Echo 08/2022 1. Left ventricular ejection fraction, by estimation, is 40 to 45%. The  left ventricle has mildly decreased function. The left ventricle  demonstrates global hypokinesis. Left ventricular diastolic parameters are  consistent with Grade I diastolic  dysfunction (impaired relaxation).   2. Right ventricular systolic function is mildly reduced. The right  ventricular size is mildly enlarged. Tricuspid regurgitation signal is  inadequate for assessing PA pressure.   3. The inferior vena cava is dilated in size with >50% respiratory  variability, suggesting right atrial pressure of 8 mmHg.    Cardiac cath 09/16/21   Mid Cx lesion is 40% stenosed.   Mid RCA lesion is 30% stenosed.   Dist RCA lesion is 40% stenosed.   There is mild left ventricular systolic dysfunction.   LV end diastolic pressure is moderately elevated.   The left ventricular ejection fraction is 35-45% by visual estimate.   1.  Mild to moderate nonobstructive coronary artery disease. 2.  Moderately reduced LV systolic function with wall motion abnormality suggestive of stress-induced cardiomyopathy.  Mildly elevated left ventricular end-diastolic pressure.   Recommendations: Recommend medical therapy for nonobstructive coronary artery disease. Obtain an echocardiogram  to better evaluate wall motion.      Physical Exam VS:  BP 132/73   Pulse 81   Ht 5' 10 (1.778 m)   Wt 180 lb 3.2 oz (81.7 kg)   SpO2 93%   BMI 25.86 kg/m        Wt Readings from Last 3 Encounters:  07/25/24 180 lb 3.2 oz  (81.7 kg)  07/08/24 181 lb (82.1 kg)  06/05/24 178 lb (80.7 kg)    GEN: Well nourished, well developed in no acute distress NECK: No JVD; No carotid bruits CARDIAC: RRR, no murmurs, rubs, gallops RESPIRATORY:  Clear to auscultation without rales, wheezing or rhonchi  ABDOMEN: Soft, non-tender, non-distended EXTREMITIES:  No edema; No deformity   ASSESSMENT AND PLAN  HFimpEF Patient did not want to take farxiga , spiro or losartan . PCP switched amlodipine  to lisinopril-hydrochlorothiazide. He is euvolemic on exam. He reports shortness of breath in the morning from phlegm and when he goes outside. He is on an allergy medication. Echo showed LVEF 50-55%, trivial MR. Continue lisinopril-hydrochlorothiazide 20-12.5mg  daily.   Nonobstructive CAD LHC in 2022 showed nonobstructive CAD. The patient has rare chest pains. Breathing is short when he goes outside. No further ischemic work-up at this time.   HLD LDL 129. Re-check lipids today. Continue Crestor  20mg  daily.   Smoker/COPD He quit smoking 20 years ago. He is following with pulmonology.      Dispo: Follow-up in 4 months  Signed, Marsela Kuan VEAR Fishman, PA-C

## 2024-07-25 NOTE — Patient Instructions (Addendum)
 Medication Instructions:  Your physician recommends that you continue on your current medications as directed. Please refer to the Current Medication list given to you today.    *If you need a refill on your cardiac medications before your next appointment, please call your pharmacy*  Lab Work: Your provider would like for you to have following labs drawn today Lipid, direct LDL.     Testing/Procedures: No test ordered today   Follow-Up: At [facility], you and your health needs are our priority.  As part of our continuing mission to provide you with exceptional heart care, our providers are all part of one team.  This team includes your primary Cardiologist (physician) and Advanced Practice Providers or APPs (Physician Assistants and Nurse Practitioners) who all work together to provide you with the care you need, when you need it.  Your next appointment:   4 month(s)  Provider:   Deatrice Cage, MD or Cadence Franchester, PA-C

## 2024-07-26 LAB — LIPID PANEL
Chol/HDL Ratio: 2.5 ratio (ref 0.0–5.0)
Cholesterol, Total: 123 mg/dL (ref 100–199)
HDL: 49 mg/dL (ref >39)
LDL Chol Calc (NIH): 54 mg/dL (ref 0–99)
Triglycerides: 111 mg/dL (ref 0–149)
VLDL Cholesterol Cal: 20 mg/dL (ref 5–40)

## 2024-07-26 LAB — LDL CHOLESTEROL, DIRECT: LDL Direct: 56 mg/dL (ref 0–99)

## 2024-07-28 ENCOUNTER — Other Ambulatory Visit: Payer: Self-pay

## 2024-07-28 ENCOUNTER — Ambulatory Visit
Admission: RE | Admit: 2024-07-28 | Discharge: 2024-07-28 | Disposition: A | Discharge status: Home / Self Care | Source: Ambulatory Visit | Attending: Family Medicine | Admitting: Family Medicine

## 2024-07-28 ENCOUNTER — Ambulatory Visit: Payer: Self-pay | Admitting: Medical

## 2024-07-28 DIAGNOSIS — I700 Atherosclerosis of aorta: Secondary | ICD-10-CM | POA: Diagnosis not present

## 2024-07-28 DIAGNOSIS — Z79899 Other long term (current) drug therapy: Secondary | ICD-10-CM

## 2024-07-28 DIAGNOSIS — Z122 Encounter for screening for malignant neoplasm of respiratory organs: Secondary | ICD-10-CM | POA: Insufficient documentation

## 2024-07-28 DIAGNOSIS — J439 Emphysema, unspecified: Secondary | ICD-10-CM | POA: Diagnosis not present

## 2024-07-28 DIAGNOSIS — R918 Other nonspecific abnormal finding of lung field: Secondary | ICD-10-CM | POA: Insufficient documentation

## 2024-07-28 LAB — GLUCOSE, CAPILLARY: Glucose-Capillary: 112 mg/dL — ABNORMAL HIGH (ref 70–99)

## 2024-07-28 MED ORDER — FLUDEOXYGLUCOSE F - 18 (FDG) INJECTION
9.3000 | Freq: Once | INTRAVENOUS | Status: AC | PRN
  Administered 2024-07-28: 10.16 via INTRAVENOUS

## 2024-08-13 ENCOUNTER — Other Ambulatory Visit
Admission: RE | Admit: 2024-08-13 | Discharge: 2024-08-13 | Disposition: A | Discharge status: Home / Self Care | Attending: Infectious Diseases | Admitting: Infectious Diseases

## 2024-08-13 DIAGNOSIS — B20 Human immunodeficiency virus [HIV] disease: Secondary | ICD-10-CM | POA: Insufficient documentation

## 2024-08-14 LAB — HIV-1 RNA QUANT-NO REFLEX-BLD
HIV 1 RNA Quant: 40 {copies}/mL
LOG10 HIV-1 RNA: 1.602 {Log_copies}/mL

## 2024-08-21 ENCOUNTER — Other Ambulatory Visit: Payer: Self-pay

## 2024-08-21 ENCOUNTER — Other Ambulatory Visit (HOSPITAL_COMMUNITY): Payer: Self-pay

## 2024-08-21 NOTE — Progress Notes (Signed)
 Specialty Pharmacy Refill Coordination Note  Albert Wong is a 68 y.o. male assessed today regarding refills of clinic administered specialty medication(s) Cabotegravir  & Rilpivirine  (CABENUVA )   Clinic requested Courier to Provider Office   Delivery date: 08/28/24   Verified address: [address] [place] [address]   Medication will be filled on 08/27/24.

## 2024-08-27 ENCOUNTER — Other Ambulatory Visit: Payer: Self-pay

## 2024-09-01 ENCOUNTER — Other Ambulatory Visit: Payer: Self-pay | Admitting: Infectious Diseases

## 2024-09-01 ENCOUNTER — Ambulatory Visit: Payer: Self-pay

## 2024-09-01 DIAGNOSIS — B20 Human immunodeficiency virus [HIV] disease: Secondary | ICD-10-CM

## 2024-09-01 NOTE — Progress Notes (Signed)
 Labs ordered fro next visit- VL and cd4

## 2024-09-03 ENCOUNTER — Other Ambulatory Visit
Admission: RE | Admit: 2024-09-03 | Discharge: 2024-09-03 | Disposition: A | Discharge status: Home / Self Care | Source: Ambulatory Visit | Attending: Infectious Diseases | Admitting: Infectious Diseases

## 2024-09-03 DIAGNOSIS — B20 Human immunodeficiency virus [HIV] disease: Secondary | ICD-10-CM | POA: Diagnosis present

## 2024-09-04 ENCOUNTER — Ambulatory Visit: Attending: Infectious Diseases | Admitting: Infectious Diseases

## 2024-09-04 ENCOUNTER — Encounter: Payer: Self-pay | Admitting: Infectious Diseases

## 2024-09-04 VITALS — BP 124/69 | HR 85 | Temp 98.4°F | Ht 70.0 in | Wt 180.0 lb

## 2024-09-04 DIAGNOSIS — M549 Dorsalgia, unspecified: Secondary | ICD-10-CM | POA: Insufficient documentation

## 2024-09-04 DIAGNOSIS — K5903 Drug induced constipation: Secondary | ICD-10-CM | POA: Diagnosis not present

## 2024-09-04 DIAGNOSIS — Z23 Encounter for immunization: Secondary | ICD-10-CM | POA: Diagnosis not present

## 2024-09-04 DIAGNOSIS — J449 Chronic obstructive pulmonary disease, unspecified: Secondary | ICD-10-CM | POA: Diagnosis not present

## 2024-09-04 DIAGNOSIS — I10 Essential (primary) hypertension: Secondary | ICD-10-CM | POA: Diagnosis not present

## 2024-09-04 DIAGNOSIS — B20 Human immunodeficiency virus [HIV] disease: Secondary | ICD-10-CM

## 2024-09-04 DIAGNOSIS — Z87891 Personal history of nicotine dependence: Secondary | ICD-10-CM | POA: Diagnosis not present

## 2024-09-04 DIAGNOSIS — Z79891 Long term (current) use of opiate analgesic: Secondary | ICD-10-CM | POA: Diagnosis not present

## 2024-09-04 DIAGNOSIS — I2510 Atherosclerotic heart disease of native coronary artery without angina pectoris: Secondary | ICD-10-CM | POA: Diagnosis not present

## 2024-09-04 DIAGNOSIS — Z79899 Other long term (current) drug therapy: Secondary | ICD-10-CM | POA: Insufficient documentation

## 2024-09-04 DIAGNOSIS — T402X5S Adverse effect of other opioids, sequela: Secondary | ICD-10-CM | POA: Diagnosis not present

## 2024-09-04 DIAGNOSIS — E785 Hyperlipidemia, unspecified: Secondary | ICD-10-CM | POA: Diagnosis not present

## 2024-09-04 DIAGNOSIS — I119 Hypertensive heart disease without heart failure: Secondary | ICD-10-CM | POA: Diagnosis not present

## 2024-09-04 LAB — T-HELPER CELLS CD4/CD8 %
% CD 4 Pos. Lymph.: 30 % — ABNORMAL LOW (ref 30.8–58.5)
Absolute CD 4 Helper: 510 /uL (ref 359–1519)
Basophils Absolute: 0.1 x10E3/uL (ref 0.0–0.2)
Basos: 1 %
CD3+CD4+ Cells/CD3+CD8+ Cells Bld: 0.62 — ABNORMAL LOW (ref 0.92–3.72)
CD3+CD8+ Cells # Bld: 819 /uL (ref 109–897)
CD3+CD8+ Cells NFr Bld: 48.2 % — ABNORMAL HIGH (ref 12.0–35.5)
EOS (ABSOLUTE): 0.4 x10E3/uL (ref 0.0–0.4)
Eos: 5 %
Hematocrit: 44.5 % (ref 37.5–51.0)
Hemoglobin: 14.7 g/dL (ref 13.0–17.7)
Immature Grans (Abs): 0 x10E3/uL (ref 0.0–0.1)
Immature Granulocytes: 0 %
Lymphocytes Absolute: 1.7 x10E3/uL (ref 0.7–3.1)
Lymphs: 23 %
MCH: 30.9 pg (ref 26.6–33.0)
MCHC: 33 g/dL (ref 31.5–35.7)
MCV: 94 fL (ref 79–97)
Monocytes Absolute: 0.8 x10E3/uL (ref 0.1–0.9)
Monocytes: 10 %
Neutrophils Absolute: 4.5 x10E3/uL (ref 1.4–7.0)
Neutrophils: 61 %
Platelets: 273 x10E3/uL (ref 150–450)
RBC: 4.76 x10E6/uL (ref 4.14–5.80)
RDW: 11.9 % (ref 11.6–15.4)
WBC: 7.4 x10E3/uL (ref 3.4–10.8)

## 2024-09-04 LAB — HIV-1 RNA QUANT-NO REFLEX-BLD
HIV 1 RNA Quant: <20 {copies}/mL
LOG10 HIV-1 RNA: UNDETERMINED {Log_copies}/mL

## 2024-09-04 MED ORDER — CABOTEGRAVIR & RILPIVIRINE ER 600 & 900 MG/3ML IM SUER
1.0000 | Freq: Once | INTRAMUSCULAR | Status: AC
  Administered 2024-09-04: 1 via INTRAMUSCULAR

## 2024-09-04 NOTE — Progress Notes (Signed)
 NAME: Albert Wong  DOB: 03-Mar-1956  MRN: [id number]  Date/Time: 09/04/2024 10:50 AM   Subjective:  Albert Wong is a 68 y.o.male  with a history of HIV disease diagnosed in early 80s , Treated hepatitis C , COPD, , on MS contin  for back pain HTN, hyperlipidemia   Pt is here for follow up for HIV. on cabenuva - doing well-  Gets cabenuva  injection every 2 months Last Vl from 8/20 is 40. He had labs yesterday. Cd4 last was from Nov 2024   Following taken from last visit note   He c/o  worsening shortness of breath and fatigue over the past couple of years. . He has a history of COPD and uses Combivent and Breztri inhalers, which he finds helpful..  A cardiac catheterization in 2022 showed non obstructive cad     he had 2 d echo ( N) , CT lung low dose some 15 mm nodules, US  abdomen no AAA  His PCP changed some meds- DC amlodipine  and aldactone  and he is now on Losartan      MS Contin  for back pain, baclofen  as a muscle relaxant, and Amitiza  for bowel issues related to irritable bowel syndrome and constipation from morphine  use.   He has quit smoking and drinking. His family history includes a brother with similar heart issues at age 54, while his mother was very active into her 90s without similar health issues.   06/01/21- First visit with me  HIV diagnosed  early 68s., says it was a routine blood test, - was in care at [facility] for many years and then went to see Dr.Fitzgerald in 2014-2019, then [facility]  Nadir Cd4 DK- on  08/14/1991 was 440 VL  OI -None  HAARt history First regimen  Azt based Atripla  Triumeq  Cabenuva  -He took his first dose of cabenuva  IM on 4/18-/24    Acquired thru sex with men Not sexually active in 20 years  Genotype unknown ? Past Medical History:  Diagnosis Date   Anxiety    Chronic nausea    COPD (chronic obstructive pulmonary disease) (HCC)    Degenerative disk disease    HIV (human immunodeficiency virus infection) (HCC)    HIV (human  immunodeficiency virus infection) (HCC)    Insomnia    Muscle spasm    Rotator cuff tear     Past Surgical History:  Procedure Laterality Date   dental procedure N/A    LEFT HEART CATH AND CORONARY ANGIOGRAPHY N/A 09/16/2021   Procedure: LEFT HEART CATH AND CORONARY ANGIOGRAPHY;  Surgeon: Darron Deatrice LABOR, MD;  Location: [facility] INVASIVE CV LAB;  Service: Cardiovascular;  Laterality: N/A;    Social History   Socioeconomic History   Marital status: Single    Spouse name: Not on file   Number of children: Not on file   Years of education: Not on file   Highest education level: Not on file  Occupational History   Not on file  Tobacco Use   Smoking status: Former    Current packs/day: 1.00    Average packs/day: 1 pack/day for 39.0 years (39.0 ttl pk-yrs)    Types: Cigarettes   Smokeless tobacco: Never   Tobacco comments:    Quit smoking 1 month ago by his report.  Vaping Use   Vaping status: Former   Devices: tried when they first came out for about 2 weeks  Substance and Sexual Activity   Alcohol use: Not Currently   Drug use: No   Sexual activity:  Not on file  Other Topics Concern   Not on file  Social History Narrative   Not on file   Social Drivers of Health   Financial Resource Strain: Not on file  Food Insecurity: No Food Insecurity (12/02/2022)   Hunger Vital Sign    Worried About Running Out of Food in the Last Year: Never true    Ran Out of Food in the Last Year: Never true  Transportation Needs: No Transportation Needs (12/02/2022)   PRAPARE - Administrator, Civil Service (Medical): No    Lack of Transportation (Non-Medical): No  Physical Activity: Not on file  Stress: Not on file  Social Connections: Not on file  Intimate Partner Violence: Not At Risk (12/02/2022)   Humiliation, Afraid, Rape, and Kick questionnaire    Fear of Current or Ex-Partner: No    Emotionally Abused: No    Physically Abused: No    Sexually Abused: No    Family History   Problem Relation Age of Onset   Alzheimer's disease Mother    Breast cancer Mother    Heart disease Father   Mother breast cancer Allergies  Allergen Reactions   Dolutegravir -Lamivudine  Other (See Comments)    Severe Flu like symptoms  Sweating   Shortness of breath  Body aches-- Doubt this is Dovato  allergy as he is tolerating truimeq which is dovato  without abacavir   Other Reaction(s): Other (See Comments)  Severe Flu like symptoms Sweating  Shortness of breath Body aches-- Doubt this is Dovato  allergy as he is tolerating truimeq which is dovato  + abacavir    Entresto  [Sacubitril-Valsartan] Photosensitivity    Dizziness   Metoprolol  Succinate [Metoprolol ] Photosensitivity    Patients reported change in vision. D/c by pt pcp   ? Current Outpatient Medications  Medication Sig Dispense Refill   albuterol  (VENTOLIN  HFA) 108 (90 Base) MCG/ACT inhaler Inhale 2 puffs into the lungs every 6 (six) hours as needed for wheezing or shortness of breath. 6.7 g 0   aspirin  EC 81 MG EC tablet Take 1 tablet (81 mg total) by mouth daily. Swallow whole. 30 tablet 2   baclofen  (LIORESAL ) 10 MG tablet TAKE 1 TABLET BY MOUTH EVERY DAY 30 tablet 11   BREZTRI AEROSPHERE 160-9-4.8 MCG/ACT AERO Inhale 2 puffs into the lungs 2 (two) times daily.     budesonide-formoterol (SYMBICORT) 80-4.5 MCG/ACT inhaler Inhale 2 puffs into the lungs 2 (two) times daily as needed.     cabotegravir  & rilpivirine  ER (CABENUVA ) 600 & 900 MG/3ML injection Inject 1 kit into the muscle every 2 (two) months. 6 mL 5   cromolyn  (OPTICROM ) 4 % ophthalmic solution      dicyclomine  (BENTYL ) 20 MG tablet Take 20 mg by mouth in the morning and at bedtime.     fluorometholone (FML) 0.1 % ophthalmic suspension SMARTSIG:In Eye(s)     Ipratropium-Albuterol  (COMBIVENT) 20-100 MCG/ACT AERS respimat Inhale into the lungs as directed.     ketoconazole (NIZORAL) 2 % cream Apply 1 application topically daily as needed for irritation.      lisinopril-hydrochlorothiazide (ZESTORETIC) 20-12.5 MG tablet Take 1 tablet by mouth daily.     lubiprostone  (AMITIZA ) 24 MCG capsule Take 24 mcg by mouth 2 (two) times daily.     montelukast (SINGULAIR) 10 MG tablet Take 10 mg by mouth daily.     morphine  (MS CONTIN ) 30 MG 12 hr tablet Take 1 tablet (30 mg total) by mouth 2 (two) times daily. 60 tablet 0   mupirocin ointment (  BACTROBAN) 2 % SMARTSIG:1 Sparingly Topical Twice Daily PRN     rosuvastatin  (CRESTOR ) 20 MG tablet Take 1 tablet (20 mg total) by mouth daily. 90 tablet 3   triamcinolone cream (KENALOG) 0.1 % Apply 1 Application topically 3 (three) times daily.     VITAMIN D  PO Take 1,000 Units by mouth daily.     No current facility-administered medications for this visit.    REVIEW OF SYSTEMS:  Const: negative fever, negative chills, has gained 30 pounds ENT: negative coryza, negative sore throat Resp: has copd , doing better- off oxygen Cards:SOB on minimal exertion GU: negative for frequency, dysuria and hematuria GI- c/o abdominal bloating constipated Skin: no rash Heme: negative for easy bruising and gum/nose bleeding MS: back pain worse in the past 3 days Neurolo:negative for headaches, dizziness, vertigo, memory problems  Psych: PTSD  Objective:  VITALS: BP 124/69   Pulse 85   Temp 98.4 F (36.9 C) (Temporal)   Ht 5' 10 (1.778 m)   Wt 180 lb (81.6 kg)   SpO2 93%   BMI 25.83 kg/m    PHYSICAL EXAM:  General: Alert, cooperative, no distress, appears stated age.  Head: Normocephalic, without obvious abnormality, atraumatic. Eyes: Conjunctivae clear, anicteric sclerae. Pupils are equal Full set of dentures Neck: Supple, symmetrical, no adenopathy, thyroid : non tender no carotid bruit and no JVD. Lungs B/l air entry- few rhonchi heart:s1s2 Abdomen: soft  Extremities: no ankle edema Lymph: Cervical, supraclavicular normal. Neurologic: Grossly non-focal IMAGING RESULTS: Health  maintenance Vaccination  Vaccine Date last given comment  Influenza    Hepatitis B Has antibodies   Hepatitis A    Prevnar-PCV-20 03/01/23   Pneumovac-PPSV-23 2020   TdaP 2021   HPV    Shingrix ( zoster vaccine)    RSV 09/14/22 ______________________  Labs Lab Result  Date comment  HIV VL <13 March 2024   CD4 496 11/01/23   Genotype     [id number] NR    HIV antibody     RPR NR 03/05/24   Quantiferon Gold Negative 03/01/23   Hep C ab reactive    Hepatitis B-ab,ag,c Sab > 12.7    Hepatitis A-IgM, IgG /T Total-pos    Lipid TC 138, LDL 90, TGL 61, HDL 36    GC/CHL          HB,PLT,Cr, LFT 13.7, Plt 205      Preventive  Procedure Result  Date comment  colonoscopy  2018        Dental exam     Opthal       Impression/Recommendation ? ?HIV disease- he is well controlled- Vl < 20 and cd4 is 496 on injectable cabenuva  Doing well- continue cabenuva    cardiomyopathy  Echo in 2025-EF 50-55% echo with EF 45-50%, LV global hypokinesis Diastolic dysfunction Cardiac cath was in 2022- non obstructive CAD   Ex Smoker-  HTN on Losartan  /HCTZ ( amlodipine  and aldactone  Dc)  HLD- was on rosuvastatin - he stopped because he was losing weight and does not want to take it anymore  Not sexually active in 21 yrs and does not want anal pap or STD screen    Treated Hepatits C- HCV RNA not detected  COPD- on inhalers,   H/0 Low back ache - on MS contin -[facility]  [facility]  Constipation with bloating-related to MS contin   he will talk to his PCP ? Labs today- follow up 4 months

## 2024-09-12 ENCOUNTER — Ambulatory Visit: Payer: Self-pay

## 2024-10-22 ENCOUNTER — Other Ambulatory Visit: Payer: Self-pay

## 2024-10-22 ENCOUNTER — Other Ambulatory Visit (HOSPITAL_COMMUNITY): Payer: Self-pay

## 2024-10-22 NOTE — Progress Notes (Signed)
 Specialty Pharmacy Refill Coordination Note  Albert Wong is a 68 y.o. male assessed today regarding refills of clinic administered specialty medication(s) Cabotegravir  & Rilpivirine  (CABENUVA )   Clinic requested Courier to Provider Office   Delivery date: 10/30/24   Verified address: [address] [place] [address]   Medication will be filled on 10/29/24.

## 2024-10-29 ENCOUNTER — Other Ambulatory Visit: Payer: Self-pay

## 2024-11-03 ENCOUNTER — Other Ambulatory Visit (HOSPITAL_COMMUNITY): Payer: Self-pay

## 2024-11-11 ENCOUNTER — Encounter: Payer: Self-pay | Admitting: Infectious Diseases

## 2024-11-11 ENCOUNTER — Ambulatory Visit: Attending: Infectious Diseases | Admitting: Infectious Diseases

## 2024-11-11 VITALS — BP 131/74 | HR 89 | Temp 98.1°F | Ht 70.0 in | Wt 179.0 lb

## 2024-11-11 DIAGNOSIS — Z8619 Personal history of other infectious and parasitic diseases: Secondary | ICD-10-CM | POA: Insufficient documentation

## 2024-11-11 DIAGNOSIS — I10 Essential (primary) hypertension: Secondary | ICD-10-CM

## 2024-11-11 DIAGNOSIS — Z79891 Long term (current) use of opiate analgesic: Secondary | ICD-10-CM | POA: Diagnosis not present

## 2024-11-11 DIAGNOSIS — M545 Low back pain, unspecified: Secondary | ICD-10-CM | POA: Insufficient documentation

## 2024-11-11 DIAGNOSIS — Z87891 Personal history of nicotine dependence: Secondary | ICD-10-CM

## 2024-11-11 DIAGNOSIS — B20 Human immunodeficiency virus [HIV] disease: Secondary | ICD-10-CM | POA: Diagnosis not present

## 2024-11-11 DIAGNOSIS — Z79899 Other long term (current) drug therapy: Secondary | ICD-10-CM | POA: Insufficient documentation

## 2024-11-11 DIAGNOSIS — Z21 Asymptomatic human immunodeficiency virus [HIV] infection status: Secondary | ICD-10-CM | POA: Diagnosis not present

## 2024-11-11 DIAGNOSIS — I2510 Atherosclerotic heart disease of native coronary artery without angina pectoris: Secondary | ICD-10-CM | POA: Insufficient documentation

## 2024-11-11 DIAGNOSIS — J449 Chronic obstructive pulmonary disease, unspecified: Secondary | ICD-10-CM

## 2024-11-11 DIAGNOSIS — I5189 Other ill-defined heart diseases: Secondary | ICD-10-CM | POA: Insufficient documentation

## 2024-11-11 DIAGNOSIS — E785 Hyperlipidemia, unspecified: Secondary | ICD-10-CM | POA: Diagnosis not present

## 2024-11-11 DIAGNOSIS — I429 Cardiomyopathy, unspecified: Secondary | ICD-10-CM | POA: Insufficient documentation

## 2024-11-11 MED ORDER — CABOTEGRAVIR & RILPIVIRINE ER 600 & 900 MG/3ML IM SUER
1.0000 | Freq: Once | INTRAMUSCULAR | Status: AC
  Administered 2024-11-11: 1 via INTRAMUSCULAR

## 2024-11-11 NOTE — Progress Notes (Unsigned)
 NAME: FATIH STALVEY  DOB: 10/12/1956  MRN: [id number]  Date/Time: 11/11/2024 11:30 AM   Subjective:  Albert Wong is a 68 y.o.male  with a history of HIV disease diagnosed in early 80s , Treated hepatitis C , COPD, , on MS contin  for back pain HTN, hyperlipidemia   Pt is here for follow up for HIV. on cabenuva - doing well-  Gets cabenuva  injection every 2 months Last Vl from 9/10  is 20  and cd4 is 510   Weight gain of 40 pounds in 2 years - says he was eating intentionally to gain weight and he thinks he has gone overboard  He c/o  worsening shortness of breath and fatigue over the past couple of years. . He has a history of COPD and uses Combivent and Breztri inhalers, which he finds helpful..he wants to change pulmonologist  A cardiac catheterization in 2022 showed non obstructive cad     he had 2 d echo ( N) , CT lung low dose some 15 mm nodules, US  abdomen no AAA      MS Contin  for back pain, baclofen  as a muscle relaxant, and Amitiza  for bowel issues related to irritable bowel syndrome and constipation from morphine  use.   He has quit smoking and drinking. His family history includes a brother with similar heart issues at age 51, while his mother was very active into her 90s without similar health issues.   06/01/21- First visit with me  HIV diagnosed  early 39s., says it was a routine blood test, - was in care at [facility] for many years and then went to see Dr.Fitzgerald in 2014-2019, then [facility]  Nadir Cd4 DK- on  08/14/1991 was 440 VL  OI -None  HAARt history First regimen  Azt based Atripla  Triumeq  Cabenuva  -He took his first dose of cabenuva  IM on 4/18-/24    Acquired thru sex with men Not sexually active in 20 years  Genotype unknown ? Past Medical History:  Diagnosis Date   Anxiety    Chronic nausea    COPD (chronic obstructive pulmonary disease) (HCC)    Degenerative disk disease    HIV (human immunodeficiency virus infection) (HCC)    HIV (human  immunodeficiency virus infection) (HCC)    Insomnia    Muscle spasm    Rotator cuff tear     Past Surgical History:  Procedure Laterality Date   dental procedure N/A    LEFT HEART CATH AND CORONARY ANGIOGRAPHY N/A 09/16/2021   Procedure: LEFT HEART CATH AND CORONARY ANGIOGRAPHY;  Surgeon: Darron Deatrice LABOR, MD;  Location: [facility] INVASIVE CV LAB;  Service: Cardiovascular;  Laterality: N/A;    Social History   Socioeconomic History   Marital status: Single    Spouse name: Not on file   Number of children: Not on file   Years of education: Not on file   Highest education level: Not on file  Occupational History   Not on file  Tobacco Use   Smoking status: Former    Current packs/day: 1.00    Average packs/day: 1 pack/day for 39.0 years (39.0 ttl pk-yrs)    Types: Cigarettes   Smokeless tobacco: Never   Tobacco comments:    Quit smoking 1 month ago by his report.  Vaping Use   Vaping status: Former   Devices: tried when they first came out for about 2 weeks  Substance and Sexual Activity   Alcohol use: Not Currently   Drug use: No  Sexual activity: Not on file  Other Topics Concern   Not on file  Social History Narrative   Not on file   Social Drivers of Health   Financial Resource Strain: Not on file  Food Insecurity: No Food Insecurity (12/02/2022)   Hunger Vital Sign    Worried About Running Out of Food in the Last Year: Never true    Ran Out of Food in the Last Year: Never true  Transportation Needs: No Transportation Needs (12/02/2022)   PRAPARE - Administrator, Civil Service (Medical): No    Lack of Transportation (Non-Medical): No  Physical Activity: Not on file  Stress: Not on file  Social Connections: Not on file  Intimate Partner Violence: Not At Risk (12/02/2022)   Humiliation, Afraid, Rape, and Kick questionnaire    Fear of Current or Ex-Partner: No    Emotionally Abused: No    Physically Abused: No    Sexually Abused: No    Family History   Problem Relation Age of Onset   Alzheimer's disease Mother    Breast cancer Mother    Heart disease Father   Mother breast cancer Allergies  Allergen Reactions   Dolutegravir -Lamivudine  Other (See Comments)    Severe Flu like symptoms  Sweating   Shortness of breath  Body aches-- Doubt this is Dovato  allergy as he is tolerating truimeq which is dovato  without abacavir   Other Reaction(s): Other (See Comments)  Severe Flu like symptoms Sweating  Shortness of breath Body aches-- Doubt this is Dovato  allergy as he is tolerating truimeq which is dovato  + abacavir    Entresto  [Sacubitril-Valsartan] Photosensitivity    Dizziness   Metoprolol  Succinate [Metoprolol ] Photosensitivity    Patients reported change in vision. D/c by pt pcp   ? Current Outpatient Medications  Medication Sig Dispense Refill   albuterol  (VENTOLIN  HFA) 108 (90 Base) MCG/ACT inhaler Inhale 2 puffs into the lungs every 6 (six) hours as needed for wheezing or shortness of breath. 6.7 g 0   aspirin  EC 81 MG EC tablet Take 1 tablet (81 mg total) by mouth daily. Swallow whole. 30 tablet 2   baclofen  (LIORESAL ) 10 MG tablet TAKE 1 TABLET BY MOUTH EVERY DAY 30 tablet 11   BREZTRI AEROSPHERE 160-9-4.8 MCG/ACT AERO Inhale 2 puffs into the lungs 2 (two) times daily.     budesonide-formoterol (SYMBICORT) 80-4.5 MCG/ACT inhaler Inhale 2 puffs into the lungs 2 (two) times daily as needed.     cabotegravir  & rilpivirine  ER (CABENUVA ) 600 & 900 MG/3ML injection Inject 1 kit into the muscle every 2 (two) months. 6 mL 5   clotrimazole (MYCELEX) 10 MG troche SMARTSIG:Troche     cromolyn  (OPTICROM ) 4 % ophthalmic solution      dicyclomine  (BENTYL ) 20 MG tablet Take 20 mg by mouth in the morning and at bedtime.     fluorometholone (FML) 0.1 % ophthalmic suspension SMARTSIG:In Eye(s)     Ipratropium-Albuterol  (COMBIVENT) 20-100 MCG/ACT AERS respimat Inhale into the lungs as directed.     ketoconazole (NIZORAL) 2 % cream Apply 1  application topically daily as needed for irritation.     lisinopril-hydrochlorothiazide (ZESTORETIC) 20-12.5 MG tablet Take 1 tablet by mouth daily.     lubiprostone  (AMITIZA ) 24 MCG capsule Take 24 mcg by mouth 2 (two) times daily.     montelukast (SINGULAIR) 10 MG tablet Take 10 mg by mouth daily.     morphine  (MS CONTIN ) 30 MG 12 hr tablet Take 1 tablet (30 mg total) by  mouth 2 (two) times daily. 60 tablet 0   mupirocin ointment (BACTROBAN) 2 % SMARTSIG:1 Sparingly Topical Twice Daily PRN     rosuvastatin  (CRESTOR ) 20 MG tablet Take 1 tablet (20 mg total) by mouth daily. 90 tablet 3   triamcinolone cream (KENALOG) 0.1 % Apply 1 Application topically 3 (three) times daily.     VITAMIN D  PO Take 1,000 Units by mouth daily.     Current Facility-Administered Medications  Medication Dose Route Frequency Provider Last Rate Last Admin   cabotegravir  & rilpivirine  ER (CABENUVA ) 600 & 900 MG/3ML injection 1 kit  1 kit Intramuscular Once Fayette Bodily, MD        REVIEW OF SYSTEMS:  Const: negative fever, negative chills, has gained 30 pounds ENT: negative coryza, negative sore throat Resp: has copd , - off oxygen Cards:SOB on minimal exertion GU: negative for frequency, dysuria and hematuria GI- c/o abdominal bloating constipated Skin: no rash Heme: negative for easy bruising and gum/nose bleeding MS: back pain  Neurolo:negative for headaches, dizziness, vertigo, memory problems  Psych: PTSD  Objective:  VITALS: BP 131/74   Pulse 89   Temp 98.1 F (36.7 C) (Temporal)   Ht 5' 10 (1.778 m)   Wt 179 lb (81.2 kg)   SpO2 92%   BMI 25.68 kg/m    PHYSICAL EXAM:  General: Alert, cooperative, no distress, appears stated age.  Head: Normocephalic, without obvious abnormality, atraumatic. Eyes: Conjunctivae clear, anicteric sclerae. Pupils are equal Full set of dentures Neck: Supple, symmetrical, no adenopathy, thyroid : non tender no carotid bruit and no JVD. Lungs B/l air  entry- few rhonchi heart:s1s2 Abdomen: soft  Extremities: no ankle edema Lymph: Cervical, supraclavicular normal. Neurologic: Grossly non-focal IMAGING RESULTS: Health maintenance Vaccination  Vaccine Date last given comment  Influenza    Hepatitis B Has antibodies   Hepatitis A    Prevnar-PCV-20 03/01/23   Pneumovac-PPSV-23 2020   TdaP 2021   HPV    Shingrix ( zoster vaccine)    RSV 09/14/22 ______________________  Labs Lab Result  Date comment  HIV VL <13 March 2024   CD4 496 11/01/23   Genotype     [id number] NR    HIV antibody     RPR NR 03/05/24   Quantiferon Gold Negative 03/01/23   Hep C ab reactive    Hepatitis B-ab,ag,c Sab > 12.7    Hepatitis A-IgM, IgG /T Total-pos    Lipid TC 138, LDL 90, TGL 61, HDL 36    GC/CHL          HB,PLT,Cr, LFT 13.7, Plt 205      Preventive  Procedure Result  Date comment  colonoscopy  2018        Dental exam     Opthal       Impression/Recommendation ? ?HIV disease- he is well controlled- Vl < 20 and cd4 is 510 on injectable cabenuva  Doing well- continue cabenuva    cardiomyopathy  Echo in 2025-EF 50-55% echo with EF 45-50%, LV global hypokinesis Diastolic dysfunction Cardiac cath was in 2022- non obstructive CAD   Ex Smoker-  HTN on Losartan  /HCTZ   HLD- was on rosuvastatin - he stopped because he was losing weight and does not want to take it anymore  Not sexually active in 21 yrs and does not want anal pap or STD screen    Treated Hepatits C- HCV RNA not detected  COPD- on inhalers, pt wants new pulmonologist- refer to Dr.Dgayli [facility]  H/0 Low back ache -  on MS contin -Pain  [facility]   ? Labs  4 months Follow up 4 months

## 2024-11-11 NOTE — Patient Instructions (Signed)
 Today you are here for follow up of HIV- you are getting cabenuva  today- you have COPD and you want another providr- will refer you to [facility] pulmonologist

## 2024-11-24 ENCOUNTER — Other Ambulatory Visit: Payer: Self-pay | Admitting: Pulmonary Disease

## 2024-11-24 DIAGNOSIS — F17210 Nicotine dependence, cigarettes, uncomplicated: Secondary | ICD-10-CM

## 2024-11-24 DIAGNOSIS — R911 Solitary pulmonary nodule: Secondary | ICD-10-CM

## 2024-11-24 DIAGNOSIS — B370 Candidal stomatitis: Secondary | ICD-10-CM

## 2024-11-28 ENCOUNTER — Ambulatory Visit: Admitting: Cardiovascular Disease

## 2024-12-22 ENCOUNTER — Other Ambulatory Visit: Payer: Self-pay

## 2024-12-22 ENCOUNTER — Telehealth: Payer: Self-pay

## 2024-12-22 ENCOUNTER — Ambulatory Visit
Admission: RE | Admit: 2024-12-22 | Discharge: 2024-12-22 | Disposition: A | Discharge status: Home / Self Care | Source: Ambulatory Visit | Attending: Pulmonary Disease | Admitting: Pulmonary Disease

## 2024-12-22 DIAGNOSIS — F17210 Nicotine dependence, cigarettes, uncomplicated: Secondary | ICD-10-CM | POA: Insufficient documentation

## 2024-12-22 DIAGNOSIS — B370 Candidal stomatitis: Secondary | ICD-10-CM | POA: Insufficient documentation

## 2024-12-22 DIAGNOSIS — R911 Solitary pulmonary nodule: Secondary | ICD-10-CM | POA: Diagnosis present

## 2024-12-22 DIAGNOSIS — B20 Human immunodeficiency virus [HIV] disease: Secondary | ICD-10-CM

## 2024-12-22 DIAGNOSIS — Z113 Encounter for screening for infections with a predominantly sexual mode of transmission: Secondary | ICD-10-CM

## 2024-12-22 NOTE — Telephone Encounter (Signed)
 Received voicemail from [facility] medical mall stating patient showed up for labs but there were no orders. Per last office note, patient does not need labs until March. Called medical mall back and notified them, patient has already left.  Dawnmarie Breon, BSN, RN

## 2024-12-24 ENCOUNTER — Other Ambulatory Visit: Payer: Self-pay

## 2024-12-24 ENCOUNTER — Other Ambulatory Visit (HOSPITAL_COMMUNITY): Payer: Self-pay

## 2024-12-24 NOTE — Progress Notes (Signed)
 Specialty Pharmacy Refill Coordination Note  Albert Wong is a 68 y.o. male contacted today regarding refills of specialty medication(s) Cabotegravir  & Rilpivirine  (CABENUVA )   Patient requested Delivery   Delivery date: 01/01/25   Verified address: [address] [place] [address]   Medication will be filled on: 12/30/24

## 2024-12-29 ENCOUNTER — Ambulatory Visit (INDEPENDENT_AMBULATORY_CARE_PROVIDER_SITE_OTHER): Admitting: Student in an Organized Health Care Education/Training Program

## 2024-12-29 ENCOUNTER — Encounter: Payer: Self-pay | Admitting: Student in an Organized Health Care Education/Training Program

## 2024-12-29 VITALS — BP 124/70 | HR 77 | Temp 97.6°F | Ht 70.0 in | Wt 176.8 lb

## 2024-12-29 DIAGNOSIS — Z87891 Personal history of nicotine dependence: Secondary | ICD-10-CM | POA: Diagnosis not present

## 2024-12-29 DIAGNOSIS — J4489 Other specified chronic obstructive pulmonary disease: Secondary | ICD-10-CM | POA: Diagnosis not present

## 2024-12-29 DIAGNOSIS — R918 Other nonspecific abnormal finding of lung field: Secondary | ICD-10-CM

## 2024-12-29 DIAGNOSIS — Z21 Asymptomatic human immunodeficiency virus [HIV] infection status: Secondary | ICD-10-CM | POA: Diagnosis not present

## 2024-12-29 DIAGNOSIS — J42 Unspecified chronic bronchitis: Secondary | ICD-10-CM

## 2024-12-29 DIAGNOSIS — R911 Solitary pulmonary nodule: Secondary | ICD-10-CM

## 2024-12-29 MED ORDER — IPRATROPIUM-ALBUTEROL 20-100 MCG/ACT IN AERS: 2.0000 | INHALATION_SPRAY | Freq: Four times a day (QID) | RESPIRATORY_TRACT | 12 refills | Status: AC | PRN

## 2024-12-29 MED ORDER — LORATADINE 10 MG PO TABS: 10.0000 mg | ORAL_TABLET | Freq: Every day | ORAL | 11 refills | Status: AC

## 2024-12-29 MED ORDER — TRELEGY ELLIPTA 100-62.5-25 MCG/ACT IN AEPB: 1.0000 | INHALATION_SPRAY | Freq: Every day | RESPIRATORY_TRACT | 11 refills | Status: AC

## 2024-12-29 NOTE — Progress Notes (Signed)
 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 Previous PFT's showed an obstructive defect which we will repeat to establish a new baseline.   Smoking and exposure to paint fumes likely the direct cause of his presentation. HIV is also associated with accelerated/increased risk for COPD. No sign of AIDS related disease, but will considered an untreated infection on the differential diagnosis, as he could be colonized with Moraxella or H. Influenza.  Current inhalers include Combivent and Breztri, but Breztri caused thrush and throat swelling. Antihistamines and Mucinex  offer temporary relief.  Discussed different options, and we will switch Breztri to Trelegy once daily, and continue Combivent for PRN use. Claritin  is prescribed for throat clearing. He is advised against using albuterol  if using Combivent every six hours. Mucinex  is discussed for use as needed, with potential nausea noted.  - Pulmonary Function Test; Future - Fluticasone -Umeclidin-Vilant (TRELEGY ELLIPTA ) 100-62.5-25 MCG/ACT AEPB; Inhale 1 puff into the lungs daily.  Dispense: 3 each; Refill: 11 - Ipratropium-Albuterol  (COMBIVENT) 20-100 MCG/ACT AERS respimat; Inhale 2 puffs into the lungs every 6 (six) hours as needed for wheezing or shortness of breath.  Dispense: 4 g; Refill: 12 - loratadine  (CLARITIN ) 10 MG tablet; Take 1 tablet (10 mg total) by mouth daily.  Dispense: 30 tablet; Refill: 11  #Pulmonary nodules under surveillance    Pulmonary nodules are under surveillance. A recent CT scan shows resolution of a previous nodule at the base of the left lung and new nodules (LUL), likely related to phlegm or infection rather than malignancy given sudden development. A RUL nodule is also under surveillance. A repeat CT scan is planned in three months to monitor nodules, with a  follow-up appointment after results are available.   - CT CHEST LCS NODULE F/U LOW DOSE WO CONTRAST; Future   Return in about 3 months (around 03/29/2025).  [name], MD [facility]  I spent 60 minutes caring for this patient today, including preparing to see the patient, obtaining a medical history , reviewing a separately obtained history, performing a medically appropriate examination and/or evaluation, counseling and educating the patient/family/caregiver, ordering medications, tests, or procedures, documenting clinical information in the electronic health record, and independently interpreting results (not separately reported/billed) and communicating results to the patient/family/caregiver  End of visit medications:  Meds ordered this encounter  Medications   Fluticasone -Umeclidin-Vilant (TRELEGY ELLIPTA ) 100-62.5-25 MCG/ACT AEPB    Sig: Inhale 1 puff into the lungs daily.    Dispense:  3 each    Refill:  11   Ipratropium-Albuterol  (COMBIVENT) 20-100 MCG/ACT AERS respimat    Sig: Inhale 2 puffs into the lungs every 6 (six) hours as needed for wheezing or shortness of breath.    Dispense:  4 g    Refill:  12   loratadine  (CLARITIN ) 10 MG tablet    Sig: Take 1 tablet (10 mg total) by mouth daily.    Dispense:  30 tablet    Refill:  11    Current Medications[1]   Subjective:   PATIENT ID: Albert Wong GENDER: male DOB: 10-21-1956, MRN: [id number]  Chief Complaint  Patient presents with   Consult    Using Combivent and Albuterol  daily. Patient reports thrush, swelling in his throat. Cough with phlegm.     HPI  Discussed the use of AI scribe software for clinical note transcription with the patient,  who gave verbal consent to proceed.  Albert Wong is a 69 year old male with COPD who presents to establish care. He was previously seen and followed by [facility], but is requesting to change pulmonary providers.  He has been experiencing  persistent phlegm in his throat for about two years, describing it as thick and green. He is able to cough it up after drinking water, but it has led to episodes of difficulty breathing. Multiple CT scans have been performed to monitor lung nodules.  He has a significant smoking history, having started at age 13 and quitting approximately 15 years ago. He smoked up to two packs a day at his peak, particularly during periods of heavy drinking. 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 He has a history of smoking marijuana but stopped due to lung infections.  He was diagnosed with HIV in the 1980s and has been on antiretroviral therapy since moving back to  . His viral load has been well-controlled, and he has not experienced any AIDS-related illnesses.  He currently uses Breztri and Combivent inhalers for his COPD. Overuse of Breztri (three times daily) led to thrush and throat swelling, so he has reduced its use. He was prescribed fluconazole and prednisone  back in September of 2025. He is also followed for a pulmonary nodule with repeat chest imaging ordered, and performed in December of 2025. He also takes an antihistamine, which has helped reduce phlegm production. He has tried Mucinex  and over-the-counter sinus sprays to manage symptoms.  He has experienced significant loss due to HIV/AIDS, having lost many friends and family members to the disease, which has had a profound emotional impact on him.    PFT DATA from [facility]:   10/13/20- FEV1 - 0.66L - 20% - 13% improvement in FEV and FVC post bronchodilator.  4/22- FEV1 - 0.62L -19%  Ancillary information including prior medications, full medical/surgical/family/social histories, and PFTs (when available) are listed below and have been reviewed.    Review of Systems  Constitutional:  Negative for chills, fever, malaise/fatigue and weight loss.  Respiratory:  Positive for cough  and shortness of breath. Negative for hemoptysis, sputum production and wheezing.   Cardiovascular:  Negative for chest pain.     Objective:   Vitals:   12/29/24 1300  BP: 124/70  Pulse: 77  Temp: 97.6 F (36.4 C)  TempSrc: Temporal  SpO2: 95%  Weight: 176 lb 12.8 oz (80.2 kg)  Height: 5' 10 (1.778 m)   95% on RA  BMI Readings from Last 3 Encounters:  12/29/24 25.37 kg/m  11/11/24 25.68 kg/m  09/04/24 25.83 kg/m   Wt Readings from Last 3 Encounters:  12/29/24 176 lb 12.8 oz (80.2 kg)  11/11/24 179 lb (81.2 kg)  09/04/24 180 lb (81.6 kg)     Physical Exam Vitals reviewed.  Constitutional:      Appearance: Normal appearance.  Cardiovascular:     Rate and Rhythm: Normal rate and regular rhythm.     Pulses: Normal pulses.     Heart sounds: Normal heart sounds.  Pulmonary:     Effort: Pulmonary effort is normal.     Breath sounds: Normal breath sounds. No wheezing or rales.  Neurological:     General: No focal deficit present.     Mental Status: He is alert. Mental status is at baseline.       Ancillary Information    Past Medical History:  Diagnosis  Date   Anxiety    Chronic nausea    COPD (chronic obstructive pulmonary disease) (HCC)    Degenerative disk disease    HIV (human immunodeficiency virus infection) (HCC)    HIV (human immunodeficiency virus infection) (HCC)    Insomnia    Muscle spasm    Rotator cuff tear      Family History  Problem Relation Age of Onset   Alzheimer's disease Mother    Breast cancer Mother    Heart disease Father      Past Surgical History:  Procedure Laterality Date   dental procedure N/A    LEFT HEART CATH AND CORONARY ANGIOGRAPHY N/A 09/16/2021   Procedure: LEFT HEART CATH AND CORONARY ANGIOGRAPHY;  Surgeon: Darron Deatrice LABOR, MD;  Location: [facility];  Service: Cardiovascular;  Laterality: N/A;    Social History   Socioeconomic History   Marital status: Single    Spouse name: Not on file    Number of children: Not on file   Years of education: Not on file   Highest education level: Not on file  Occupational History   Not on file  Tobacco Use   Smoking status: Former    Current packs/day: 0.00    Average packs/day: 1 pack/day for 39.0 years (39.0 ttl pk-yrs)    Types: Cigarettes    Start date: 20    Quit date: 2011    Years since quitting: 15.0   Smokeless tobacco: Never  Vaping Use   Vaping status: Former   Devices: tried when they first came out for about 2 weeks  Substance and Sexual Activity   Alcohol use: Not Currently   Drug use: No   Sexual activity: Not on file  Other Topics Concern   Not on file  Social History Narrative   Not on file   Social Drivers of Health   Tobacco Use: Medium Risk (12/29/2024)   Patient History    Smoking Tobacco Use: Former    Smokeless Tobacco Use: Never    Passive Exposure: Not on Actuary Strain: Not on file  Food Insecurity: No Food Insecurity (12/02/2022)   Hunger Vital Sign    Worried About Running Out of Food in the Last Year: Never true    Ran Out of Food in the Last Year: Never true  Transportation Needs: No Transportation Needs (12/02/2022)   PRAPARE - Administrator, Civil Service (Medical): No    Lack of Transportation (Non-Medical): No  Physical Activity: Not on file  Stress: Not on file  Social Connections: Not on file  Intimate Partner Violence: Not At Risk (12/02/2022)   Humiliation, Afraid, Rape, and Kick questionnaire    Fear of Current or Ex-Partner: No    Emotionally Abused: No    Physically Abused: No    Sexually Abused: No  Depression (PHQ2-9): Low Risk (11/11/2024)   Depression (PHQ2-9)    PHQ-2 Score: 1  Alcohol Screen: Not on file  Housing: Low Risk (12/02/2022)   Housing    Last Housing Risk Score: 0  Utilities: Not At Risk (12/02/2022)   AHC Utilities    Threatened with loss of utilities: No  Health Literacy: Not on file     Allergies[2]   CBC     Component Value Date/Time   WBC 7.4 09/03/2024 1000   WBC 7.4 03/05/2024 1034   RBC 4.76 09/03/2024 1000   RBC 5.10 03/05/2024 1034   HGB 14.7 09/03/2024 1000   HCT  44.5 09/03/2024 1000   PLT 273 09/03/2024 1000   MCV 94 09/03/2024 1000   MCV 97 07/23/2013 1652   MCH 30.9 09/03/2024 1000   MCH 30.6 03/05/2024 1034   MCHC 33.0 09/03/2024 1000   MCHC 33.7 03/05/2024 1034   RDW 11.9 09/03/2024 1000   RDW 13.1 07/23/2013 1652   LYMPHSABS 1.7 09/03/2024 1000   LYMPHSABS 1.7 07/23/2013 1652   MONOABS 0.6 03/05/2024 1034   MONOABS 0.4 07/23/2013 1652   EOSABS 0.4 09/03/2024 1000   EOSABS 0.1 07/23/2013 1652   BASOSABS 0.1 09/03/2024 1000   BASOSABS 0.1 07/23/2013 1652    Pulmonary Functions Testing Results:     No data to display          Outpatient Medications Prior to Visit  Medication Sig Dispense Refill   albuterol  (VENTOLIN  HFA) 108 (90 Base) MCG/ACT inhaler Inhale 2 puffs into the lungs every 6 (six) hours as needed for wheezing or shortness of breath. 6.7 g 0   aspirin  EC 81 MG EC tablet Take 1 tablet (81 mg total) by mouth daily. Swallow whole. 30 tablet 2   baclofen  (LIORESAL ) 10 MG tablet TAKE 1 TABLET BY MOUTH EVERY DAY 30 tablet 11   cabotegravir  & rilpivirine  ER (CABENUVA ) 600 & 900 MG/3ML injection Inject 1 kit into the muscle every 2 (two) months. 6 mL 5   dicyclomine  (BENTYL ) 20 MG tablet Take 20 mg by mouth in the morning and at bedtime.     lisinopril-hydrochlorothiazide (ZESTORETIC) 20-12.5 MG tablet Take 1 tablet by mouth daily.     lubiprostone  (AMITIZA ) 24 MCG capsule Take 24 mcg by mouth 2 (two) times daily.     morphine  (MS CONTIN ) 30 MG 12 hr tablet Take 1 tablet (30 mg total) by mouth 2 (two) times daily. 60 tablet 0   rosuvastatin  (CRESTOR ) 20 MG tablet Take 1 tablet (20 mg total) by mouth daily. 90 tablet 3   BREZTRI AEROSPHERE 160-9-4.8 MCG/ACT AERO Inhale 2 puffs into the lungs 2 (two) times daily.     Ipratropium-Albuterol  (COMBIVENT) 20-100  MCG/ACT AERS respimat Inhale into the lungs as directed.     budesonide-formoterol (SYMBICORT) 80-4.5 MCG/ACT inhaler Inhale 2 puffs into the lungs 2 (two) times daily as needed.     clotrimazole (MYCELEX) 10 MG troche SMARTSIG:Troche     cromolyn  (OPTICROM ) 4 % ophthalmic solution      fluorometholone (FML) 0.1 % ophthalmic suspension SMARTSIG:In Eye(s)     ketoconazole (NIZORAL) 2 % cream Apply 1 application topically daily as needed for irritation.     montelukast (SINGULAIR) 10 MG tablet Take 10 mg by mouth daily.     mupirocin ointment (BACTROBAN) 2 % SMARTSIG:1 Sparingly Topical Twice Daily PRN     triamcinolone cream (KENALOG) 0.1 % Apply 1 Application topically 3 (three) times daily.     VITAMIN D  PO Take 1,000 Units by mouth daily.     No facility-administered medications prior to visit.      [1]  Current Outpatient Medications:    albuterol  (VENTOLIN  HFA) 108 (90 Base) MCG/ACT inhaler, Inhale 2 puffs into the lungs every 6 (six) hours as needed for wheezing or shortness of breath., Disp: 6.7 g, Rfl: 0   aspirin  EC 81 MG EC tablet, Take 1 tablet (81 mg total) by mouth daily. Swallow whole., Disp: 30 tablet, Rfl: 2   baclofen  (LIORESAL ) 10 MG tablet, TAKE 1 TABLET BY MOUTH EVERY DAY, Disp: 30 tablet, Rfl: 11   cabotegravir  & rilpivirine  ER (  CABENUVA ) 600 & 900 MG/3ML injection, Inject 1 kit into the muscle every 2 (two) months., Disp: 6 mL, Rfl: 5   dicyclomine  (BENTYL ) 20 MG tablet, Take 20 mg by mouth in the morning and at bedtime., Disp: , Rfl:    Fluticasone -Umeclidin-Vilant (TRELEGY ELLIPTA ) 100-62.5-25 MCG/ACT AEPB, Inhale 1 puff into the lungs daily., Disp: 3 each, Rfl: 11   lisinopril-hydrochlorothiazide (ZESTORETIC) 20-12.5 MG tablet, Take 1 tablet by mouth daily., Disp: , Rfl:    loratadine  (CLARITIN ) 10 MG tablet, Take 1 tablet (10 mg total) by mouth daily., Disp: 30 tablet, Rfl: 11   lubiprostone  (AMITIZA ) 24 MCG capsule, Take 24 mcg by mouth 2 (two) times daily., Disp: ,  Rfl:    morphine  (MS CONTIN ) 30 MG 12 hr tablet, Take 1 tablet (30 mg total) by mouth 2 (two) times daily., Disp: 60 tablet, Rfl: 0   rosuvastatin  (CRESTOR ) 20 MG tablet, Take 1 tablet (20 mg total) by mouth daily., Disp: 90 tablet, Rfl: 3   Ipratropium-Albuterol  (COMBIVENT) 20-100 MCG/ACT AERS respimat, Inhale 2 puffs into the lungs every 6 (six) hours as needed for wheezing or shortness of breath., Disp: 4 g, Rfl: 12 [2]  Allergies Allergen Reactions   Dolutegravir -Lamivudine  Other (See Comments)    Severe Flu like symptoms  Sweating   Shortness of breath  Body aches-- Doubt this is Dovato  allergy as he is tolerating truimeq which is dovato  without abacavir   Other Reaction(s): Other (See Comments)  Severe Flu like symptoms Sweating  Shortness of breath Body aches-- Doubt this is Dovato  allergy as he is tolerating truimeq which is dovato  + abacavir    Entresto  [Sacubitril-Valsartan] Photosensitivity    Dizziness   Metoprolol  Succinate [Metoprolol ] Photosensitivity    Patients reported change in vision. D/c by pt pcp   "

## 2024-12-29 NOTE — Patient Instructions (Addendum)
" °  VISIT SUMMARY: During your visit, we discussed your persistent phlegm and breathing difficulties related to your COPD and chronic bronchitis. We also reviewed the status of your pulmonary nodules and made adjustments to your medication regimen to better manage your symptoms.  YOUR PLAN: -CHRONIC OBSTRUCTIVE PULMONARY DISEASE WITH CHRONIC BRONCHITIS: COPD with chronic bronchitis is a long-term lung condition that causes persistent phlegm and shortness of breath. Your smoking history and exposure to paint fumes have contributed to this condition. We are discontinuing Breztri due to side effects and starting you on Trelegy, one puff daily. Continue using Combivent, two puffs every six hours as needed, but avoid using albuterol  if you are using Combivent. Claritin  is prescribed to help with throat clearing, and Mucinex  can be used as needed, though it may cause nausea. A breathing test will be done to establish a baseline for your lung function.  -PULMONARY NODULES UNDER SURVEILLANCE: Pulmonary nodules are small growths in the lungs that need to be monitored. Your recent CT scan showed that a previous nodule has resolved, but new nodules have appeared, likely due to phlegm or infection. We will repeat the CT scan in three months to monitor these nodules and follow up with you after the results are available.  INSTRUCTIONS: Please schedule a breathing test to establish a baseline for your lung function. We will also need to repeat your CT scan in three months to monitor the pulmonary nodules. Follow up with us  after the CT scan results are available.    Contains text generated by [employer]bridge. "

## 2024-12-30 ENCOUNTER — Other Ambulatory Visit: Payer: Self-pay

## 2024-12-31 ENCOUNTER — Other Ambulatory Visit: Payer: Self-pay

## 2025-01-13 ENCOUNTER — Ambulatory Visit: Attending: Infectious Diseases

## 2025-01-13 DIAGNOSIS — B20 Human immunodeficiency virus [HIV] disease: Secondary | ICD-10-CM

## 2025-01-13 MED ORDER — CABOTEGRAVIR & RILPIVIRINE ER 600 & 900 MG/3ML IM SUER
1.0000 | Freq: Once | INTRAMUSCULAR | Status: AC
  Administered 2025-01-13: 1 via INTRAMUSCULAR

## 2025-01-13 NOTE — Progress Notes (Signed)
 PATIENT IN OFFICE FOR HER CABENUVA  INJECTION. TOLERATED WELL. NO CONCERNS OR ISSUES.  Nate Common ONEIDA Ligas, CMA

## 2025-01-29 ENCOUNTER — Encounter

## 2025-02-04 ENCOUNTER — Encounter

## 2025-03-12 ENCOUNTER — Ambulatory Visit: Admitting: Infectious Diseases

## 2025-03-31 ENCOUNTER — Encounter

## 2025-04-20 ENCOUNTER — Ambulatory Visit: Admitting: Student in an Organized Health Care Education/Training Program
